# Patient Record
Sex: Female | Born: 1945 | Race: White | Hispanic: No
Health system: Midwestern US, Community
[De-identification: ages and names within clinical notes are randomized; demographics above are authoritative.]

## PROBLEM LIST (undated history)

## (undated) DIAGNOSIS — E785 Hyperlipidemia, unspecified: Secondary | ICD-10-CM

## (undated) DIAGNOSIS — H269 Unspecified cataract: Secondary | ICD-10-CM

## (undated) DIAGNOSIS — R32 Unspecified urinary incontinence: Secondary | ICD-10-CM

## (undated) DIAGNOSIS — I10 Essential (primary) hypertension: Secondary | ICD-10-CM

## (undated) DIAGNOSIS — H35039 Hypertensive retinopathy, unspecified eye: Secondary | ICD-10-CM

## (undated) DIAGNOSIS — F32A Depression, unspecified: Secondary | ICD-10-CM

## (undated) DIAGNOSIS — M75102 Unspecified rotator cuff tear or rupture of left shoulder, not specified as traumatic: Secondary | ICD-10-CM

## (undated) DIAGNOSIS — F419 Anxiety disorder, unspecified: Secondary | ICD-10-CM

## (undated) HISTORY — PX: FACIAL COSMETIC SURGERY: SHX629

## (undated) HISTORY — DX: Unspecified urinary incontinence: R32

## (undated) HISTORY — DX: Hypertensive retinopathy, unspecified eye: H35.039

## (undated) HISTORY — DX: Unspecified cataract: H26.9

## (undated) HISTORY — DX: Hyperlipidemia, unspecified: E78.5

## (undated) HISTORY — PX: OTHER SURGICAL HISTORY: SHX169

---
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
## 1998-09-27 ENCOUNTER — Emergency Department (HOSPITAL_COMMUNITY): Admission: EM | Admit: 1998-09-27 | Discharge: 1998-09-27 | Payer: Self-pay | Admitting: Emergency Medicine

## 1998-09-27 ENCOUNTER — Encounter: Payer: Self-pay | Admitting: Emergency Medicine

## 1999-04-20 ENCOUNTER — Encounter (INDEPENDENT_AMBULATORY_CARE_PROVIDER_SITE_OTHER): Payer: Self-pay | Admitting: Specialist

## 1999-04-20 ENCOUNTER — Other Ambulatory Visit: Admission: RE | Admit: 1999-04-20 | Discharge: 1999-04-20 | Payer: Self-pay | Admitting: *Deleted

## 1999-05-13 ENCOUNTER — Ambulatory Visit (HOSPITAL_BASED_OUTPATIENT_CLINIC_OR_DEPARTMENT_OTHER): Admission: RE | Admit: 1999-05-13 | Discharge: 1999-05-13 | Payer: Self-pay | Admitting: Plastic Surgery

## 1999-09-24 ENCOUNTER — Encounter: Admission: RE | Admit: 1999-09-24 | Discharge: 1999-09-24 | Payer: Self-pay | Admitting: *Deleted

## 1999-10-28 ENCOUNTER — Ambulatory Visit (HOSPITAL_COMMUNITY): Admission: RE | Admit: 1999-10-28 | Discharge: 1999-10-28 | Payer: Self-pay | Admitting: Gastroenterology

## 2000-04-20 ENCOUNTER — Other Ambulatory Visit: Admission: RE | Admit: 2000-04-20 | Discharge: 2000-04-20 | Payer: Self-pay | Admitting: *Deleted

## 2001-04-28 ENCOUNTER — Other Ambulatory Visit: Admission: RE | Admit: 2001-04-28 | Discharge: 2001-04-28 | Payer: Self-pay | Admitting: *Deleted

## 2002-07-31 ENCOUNTER — Other Ambulatory Visit: Admission: RE | Admit: 2002-07-31 | Discharge: 2002-07-31 | Payer: Self-pay

## 2006-04-26 ENCOUNTER — Other Ambulatory Visit: Admission: RE | Admit: 2006-04-26 | Discharge: 2006-04-26 | Payer: Self-pay | Admitting: Obstetrics and Gynecology

## 2006-06-29 ENCOUNTER — Ambulatory Visit: Payer: Self-pay | Admitting: Family Medicine

## 2006-09-27 ENCOUNTER — Ambulatory Visit: Payer: Self-pay | Admitting: Family Medicine

## 2007-01-11 ENCOUNTER — Ambulatory Visit: Payer: Self-pay | Admitting: Family Medicine

## 2007-01-16 ENCOUNTER — Encounter: Admission: RE | Admit: 2007-01-16 | Discharge: 2007-01-16 | Payer: Self-pay | Admitting: Family Medicine

## 2007-01-17 ENCOUNTER — Ambulatory Visit: Payer: Self-pay | Admitting: Family Medicine

## 2007-03-02 ENCOUNTER — Ambulatory Visit: Payer: Self-pay | Admitting: Family Medicine

## 2007-04-19 ENCOUNTER — Encounter: Admission: RE | Admit: 2007-04-19 | Discharge: 2007-04-19 | Payer: Self-pay | Admitting: Family Medicine

## 2007-06-05 ENCOUNTER — Ambulatory Visit: Payer: Self-pay | Admitting: Family Medicine

## 2007-06-27 ENCOUNTER — Ambulatory Visit: Payer: Self-pay | Admitting: Family Medicine

## 2007-09-27 ENCOUNTER — Other Ambulatory Visit: Admission: RE | Admit: 2007-09-27 | Discharge: 2007-09-27 | Payer: Self-pay | Admitting: Obstetrics and Gynecology

## 2007-10-16 ENCOUNTER — Ambulatory Visit: Payer: Self-pay | Admitting: Family Medicine

## 2007-10-18 ENCOUNTER — Encounter: Admission: RE | Admit: 2007-10-18 | Discharge: 2007-10-18 | Payer: Self-pay | Admitting: Family Medicine

## 2007-10-19 ENCOUNTER — Encounter: Admission: RE | Admit: 2007-10-19 | Discharge: 2007-10-19 | Payer: Self-pay | Admitting: Family Medicine

## 2008-10-30 ENCOUNTER — Other Ambulatory Visit: Admission: RE | Admit: 2008-10-30 | Discharge: 2008-10-30 | Payer: Self-pay | Admitting: Obstetrics and Gynecology

## 2008-11-12 ENCOUNTER — Ambulatory Visit: Payer: Self-pay | Admitting: Family Medicine

## 2008-12-10 ENCOUNTER — Ambulatory Visit: Payer: Self-pay | Admitting: Sports Medicine

## 2008-12-10 DIAGNOSIS — M629 Disorder of muscle, unspecified: Secondary | ICD-10-CM | POA: Insufficient documentation

## 2008-12-10 DIAGNOSIS — M25559 Pain in unspecified hip: Secondary | ICD-10-CM | POA: Insufficient documentation

## 2008-12-10 DIAGNOSIS — M2420 Disorder of ligament, unspecified site: Secondary | ICD-10-CM | POA: Insufficient documentation

## 2009-10-10 ENCOUNTER — Ambulatory Visit: Payer: Self-pay | Admitting: Family Medicine

## 2010-11-13 NOTE — Op Note (Signed)
[facility]  Patient:    Emily Reid, Emily Reid                        MRN: [id number] Proc. Date: 10/28/99 Attending:  Griffith Citron, M.D. CC:         Warrick Parisian, M.D.             Sung Amabile Roslyn Smiling, M.D.                           Operative Report  PROCEDURE:  Colonoscopy.  INDICATION:  A 65 year old white female undergoing initial colorectal neoplasia  surveillance, prompted by recent change in bowel habits by becoming more constipated rather abruptly.  No change in stool caliber.  Denies hematochezia. No family history of colorectal neoplasia.  DESCRIPTION OF PROCEDURE:  After reviewing the nature of the procedure with the  patient, including the potential risks and complications; and after discussing alternative methods of diagnosis and treatment, an informed consent was signed.   The patient was premedicated, receiving IV sedation totalling Versed 6 mg, fentanyl 75 mcg administered in divided doses prior to, and during the course of the procedure.  Using an Olympus pediatric PCF-140L video colonoscope, the rectum was intubated  after normal digital examination.  The scope was inserted and advanced around the entire length of the colon to the cecum -- identified by the appendiceal orifice and ileocecal valve.  Preparation was excellent to the right colon, where it was good.  The scope was slowly withdrawn with careful inspection of the entire colon in a retrograde manner.  No abnormality was noted.  Specifically, there was no evidence of colorectal neoplasia, mucosal inflammation, vascular lesion, or diverticular change.  Retroflex view in the rectal vault was normal.  The vault was decompressed.  The scope was withdrawn.  The patient tolerated the procedure without difficulty, being maintained on dAdo scope monitor and low-flow oxygen throughout.  TIME:  Two.  TECHNICAL:  One.  PREPARATION:  Two.  TOTAL SCORE:   Five.  ASSESSMENT: 1. Normal colonoscopy. 2. Constipation -- functional.  RECOMMENDATION: 1. Miralax p.r.n. 2. High-fiber diet. 3. Colonoscopy in 10 years. 4. Annual Hemoccult -- Dr. Traci Sermon. DD:  10/28/99 TD:  10/30/99 Job: [id number]

## 2011-12-18 ENCOUNTER — Emergency Department (HOSPITAL_COMMUNITY)
Admission: EM | Admit: 2011-12-18 | Discharge: 2011-12-19 | Disposition: A | Discharge status: Hospice | Payer: Medicare Other | Attending: Emergency Medicine | Admitting: Emergency Medicine

## 2011-12-18 ENCOUNTER — Emergency Department (HOSPITAL_COMMUNITY): Payer: Medicare Other

## 2011-12-18 ENCOUNTER — Encounter (HOSPITAL_COMMUNITY): Payer: Self-pay | Admitting: Emergency Medicine

## 2011-12-18 DIAGNOSIS — Z79899 Other long term (current) drug therapy: Secondary | ICD-10-CM | POA: Insufficient documentation

## 2011-12-18 DIAGNOSIS — L03211 Cellulitis of face: Secondary | ICD-10-CM | POA: Insufficient documentation

## 2011-12-18 DIAGNOSIS — I10 Essential (primary) hypertension: Secondary | ICD-10-CM | POA: Insufficient documentation

## 2011-12-18 DIAGNOSIS — K122 Cellulitis and abscess of mouth: Secondary | ICD-10-CM

## 2011-12-18 DIAGNOSIS — L0201 Cutaneous abscess of face: Secondary | ICD-10-CM | POA: Insufficient documentation

## 2011-12-18 DIAGNOSIS — R220 Localized swelling, mass and lump, head: Secondary | ICD-10-CM | POA: Insufficient documentation

## 2011-12-18 HISTORY — DX: Essential (primary) hypertension: I10

## 2011-12-18 LAB — DIFFERENTIAL
Basophils Absolute: 0 10*3/uL (ref 0.0–0.1)
Basophils Relative: 0 % (ref 0–1)
Eosinophils Absolute: 0.1 10*3/uL (ref 0.0–0.7)
Neutro Abs: 8.3 10*3/uL — ABNORMAL HIGH (ref 1.7–7.7)
Neutrophils Relative %: 82 % — ABNORMAL HIGH (ref 43–77)

## 2011-12-18 LAB — CBC
MCH: 31.3 pg (ref 26.0–34.0)
MCHC: 34.2 g/dL (ref 30.0–36.0)
Platelets: 212 10*3/uL (ref 150–400)
RDW: 12.7 % (ref 11.5–15.5)

## 2011-12-18 LAB — POCT I-STAT, CHEM 8
Chloride: 95 mEq/L — ABNORMAL LOW (ref 96–112)
HCT: 37 % (ref 36.0–46.0)
Hemoglobin: 12.6 g/dL (ref 12.0–15.0)
Potassium: 3.7 mEq/L (ref 3.5–5.1)

## 2011-12-18 MED ORDER — SODIUM CHLORIDE 0.9 % IV SOLN
INTRAVENOUS | Status: DC
  Administered 2011-12-18: 19:00:00 via INTRAVENOUS

## 2011-12-18 MED ORDER — DEXTROSE 5 % IV SOLN
INTRAVENOUS | Status: AC
  Filled 2011-12-18: qty 50

## 2011-12-18 MED ORDER — ONDANSETRON HCL 4 MG/2ML IJ SOLN: 4.0000 mg | Freq: Once | INTRAMUSCULAR | Status: DC

## 2011-12-18 MED ORDER — MORPHINE SULFATE 4 MG/ML IJ SOLN
4.0000 mg | Freq: Once | INTRAMUSCULAR | Status: AC
  Administered 2011-12-18: 4 mg via INTRAVENOUS
  Filled 2011-12-18: qty 1

## 2011-12-18 MED ORDER — FENTANYL CITRATE 0.05 MG/ML IJ SOLN
50.0000 ug | Freq: Once | INTRAMUSCULAR | Status: AC
  Administered 2011-12-18: 50 ug via INTRAVENOUS
  Filled 2011-12-18: qty 2

## 2011-12-18 MED ORDER — CLINDAMYCIN PHOSPHATE 600 MG/50ML IV SOLN
600.0000 mg | Freq: Once | INTRAVENOUS | Status: AC
  Administered 2011-12-18: 600 mg via INTRAVENOUS

## 2011-12-18 MED ORDER — IOHEXOL 300 MG/ML  SOLN
75.0000 mL | Freq: Once | INTRAMUSCULAR | Status: AC | PRN
  Administered 2011-12-18: 75 mL via INTRAVENOUS

## 2011-12-18 MED ORDER — CLINDAMYCIN PHOSPHATE 600 MG/4ML IJ SOLN
INTRAMUSCULAR | Status: AC
  Filled 2011-12-18: qty 4

## 2011-12-18 MED ORDER — ONDANSETRON HCL 4 MG/2ML IJ SOLN
4.0000 mg | Freq: Once | INTRAMUSCULAR | Status: AC
  Administered 2011-12-18: 4 mg via INTRAVENOUS
  Filled 2011-12-18: qty 2

## 2011-12-18 NOTE — ED Notes (Signed)
Pt reports swelling to right jaw side. Pt has been taken Augmentin since Thursday. Pt seen by dentist and had wisdom tooth removed. Pt continued to have swelling and had CT done. Pt seen by endodontitis today and told to come to ED because swelling is not in her teeth. Pt reports difficulty to swallow but denies shortness of breath.

## 2011-12-18 NOTE — ED Provider Notes (Signed)
History     CSN: [id number]  Arrival date & time 12/18/11  1441   First MD Initiated Contact with Patient 12/18/11 1742      Chief Complaint  Patient presents with  . Facial Swelling    right side of face    (Consider location/radiation/quality/duration/timing/severity/associated sxs/prior treatment) HPI  Past Medical History  Diagnosis Date  . Hypertension     Past Surgical History  Procedure Date  . Facial cosmetic surgery     History reviewed. No pertinent family history.  History  Substance Use Topics  . Smoking status: Never Smoker   . Smokeless tobacco: Not on file  . Alcohol Use: 4.2 oz/week    7 Glasses of wine per week    OB History    Grav Para Term Preterm Abortions TAB SAB Ect Mult Living                  Review of Systems  Allergies  Review of patient's allergies indicates no known allergies.  Home Medications   Current Outpatient Rx  Name Route Sig Dispense Refill  . AMOXICILLIN-POT CLAVULANATE 875-125 MG PO TABS Oral Take 1 tablet by mouth 2 (two) times daily. Take for 10 days.  First dose 12/16/2011.    Marland Kitchen BUPROPION HCL ER (XL) 150 MG PO TB24 Oral Take 150 mg by mouth daily.    Marland Kitchen VITAMIN D PO Oral Take 1 capsule by mouth daily.    . IBUPROFEN 200 MG PO TABS Oral Take 200 mg by mouth every 6 (six) hours as needed. For pain.    Marland Kitchen LOSARTAN POTASSIUM PO Oral Take 1 tablet by mouth daily.    . ADULT MULTIVITAMIN W/MINERALS CH Oral Take 1 tablet by mouth daily.    Marland Kitchen FISH OIL PO Oral Take 1 capsule by mouth 5 (five) times daily.    . SERTRALINE HCL 50 MG PO TABS Oral Take 50 mg by mouth daily.    Marland Kitchen SIMVASTATIN 80 MG PO TABS Oral Take 80 mg by mouth at bedtime.    Marland Kitchen VITAMIN C 500 MG PO TABS Oral Take 500 mg by mouth daily.      BP 132/63  Pulse 74  Temp 98.2 F (36.8 C) (Oral)  Resp 16  SpO2 97%  Physical Exam  ED Course  Procedures (including critical care time)  Labs Reviewed  CBC - Abnormal; Notable for the following:    RBC 3.80  (*)     Hemoglobin 11.9 (*)     HCT 34.8 (*)     All other components within normal limits  DIFFERENTIAL - Abnormal; Notable for the following:    Neutrophils Relative 82 (*)     Neutro Abs 8.3 (*)     Lymphocytes Relative 7 (*)     All other components within normal limits  POCT I-STAT, CHEM 8 - Abnormal; Notable for the following:    Sodium 132 (*)     Chloride 95 (*)     Creatinine, Ser 1.20 (*)     Glucose, Bld 118 (*)     All other components within normal limits   Ct Soft Tissue Neck W Contrast  12/18/2011  *RADIOLOGY REPORT*  Clinical Data:   submandibular cellulitis.  Rule out abscess  CT NECK WITH CONTRAST  Technique:  Multidetector CT imaging of the neck was performed with intravenous contrast.  Contrast: 75mL OMNIPAQUE IOHEXOL 300 MG/ML  SOLN  Comparison: None.  Findings: Extensive swelling is present in the floor the  mouth on the right.  There is a serpiginous fluid collection in the floor of the mouth  compatible with an abscess.  This extends back to the submandibular gland.  There is a gas bubble within the fluid collection near the submandibular gland.  No submandibular ductal calculus is identified.  The right lower third molar is missing, and has been recently extracted.  The bone appears well marginated. The  abscess extends to the midline and displaces the midline to the left .  There is edema in the subcutaneous tissues and the chin compatible with cellulitis.  The left submandibular gland is normal.  The parotid gland is normal bilaterally.  Mild reactive adenopathy is present in the neck.  The larynx is normal.  Thyroid is normal.  Lung apices are clear.  Cervical spondylosis is present without acute abnormality in the spine.  IMPRESSION: Multi locular serpiginous abscess in the floor the mouth on the right, extending back into the right submandibular gland.  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  There is mild adenopathy.  I discussed the findings by  telephone with Dr. Fonnie Jarvis at the time of interpretation.  Original Report Authenticated By: Camelia Phenes, M.D.     1. Submandibular abscess    8:17 PM Patient seen and examined. Transfer to [facility] arranged by Dr. Fonnie Jarvis.   Patient seen and examined. Will give more pain medication.   Exam:  Gen NAD; Mouth + woody tongue, R mandibular firmness and tenderness consistent with abscess; Heart RRR, nml S1,S2, no m/r/g; Lungs CTAB; Abd soft, NT, no rebound or guarding; Ext 2+ pedal pulses bilaterally, no edema.   MDM  Transfer to [facility] for Ludwig's/submandibular abscess.        Renne Crigler, [place] 12/18/11 2019

## 2011-12-18 NOTE — ED Notes (Signed)
Pt husband at desk stating that patient is experiencing increased trouble swallowing over the last 30 min, pt sitting in chair, skin warm and dry, no distress noted, pt next to acute bed.

## 2011-12-18 NOTE — ED Provider Notes (Cosign Needed)
History     CSN: [id number]  Arrival date & time 12/18/11  1441   First MD Initiated Contact with Patient 12/18/11 1742      Chief Complaint  Patient presents with  . Facial Swelling    right side of face    (Consider location/radiation/quality/duration/timing/severity/associated sxs/prior treatment) HPI This 66 year old female lives in [place] and is here visiting, she lived here for about 30 years and moved away 2 years ago, she has now about 5 days of the submandibular pain redness swelling cellulitis getting worse despite 3 days of Augmentin, she was seen in [facility] and started on Augmentin 2 days ago, she had CT scan which was indeterminate, she had no improvement by seeing a dentist who extracted tooth #32, she then followed up with an endodontist today while visiting here in [place] who recommended she come to the hospital since her infection is getting worse and she might need an oral surgeon because he did not feel her infection was from her teeth, she has no dental pain fever headache or neck swelling, she is no stridor or drooling, her speech is normal, she has no cough chest pain shortness breath or abdominal pain but she does have some nausea. She's had no vomiting. Her pain is moderately severe well localized without radiation or other associated symptoms. Past Medical History  Diagnosis Date  . Hypertension   lipids; depression  Past Surgical History  Procedure Date  . Facial cosmetic surgery     History reviewed. No pertinent family history.  History  Substance Use Topics  . Smoking status: Never Smoker   . Smokeless tobacco: Not on file  . Alcohol Use: 4.2 oz/week    7 Glasses of wine per week    OB History    Grav Para Term Preterm Abortions TAB SAB Ect Mult Living                  Review of Systems  Constitutional: Negative for fever.       10 Systems reviewed and are negative for acute change except as noted in  the HPI.  HENT: Negative for congestion.   Eyes: Negative for discharge and redness.  Respiratory: Negative for cough and shortness of breath.   Cardiovascular: Negative for chest pain.  Gastrointestinal: Negative for vomiting and abdominal pain.  Musculoskeletal: Negative for back pain.  Skin: Negative for rash.  Neurological: Negative for syncope, numbness and headaches.  Psychiatric/Behavioral:       No behavior change.    Allergies  Review of patient's allergies indicates no known allergies.  Home Medications   Current Outpatient Rx  Name Route Sig Dispense Refill  . AMOXICILLIN-POT CLAVULANATE 875-125 MG PO TABS Oral Take 1 tablet by mouth 2 (two) times daily. Take for 10 days.  First dose 12/16/2011.    Marland Kitchen BUPROPION HCL ER (XL) 150 MG PO TB24 Oral Take 150 mg by mouth daily.    Marland Kitchen VITAMIN D PO Oral Take 1 capsule by mouth daily.    . IBUPROFEN 200 MG PO TABS Oral Take 200 mg by mouth every 6 (six) hours as needed. For pain.    Marland Kitchen LOSARTAN POTASSIUM PO Oral Take 1 tablet by mouth daily.    . ADULT MULTIVITAMIN W/MINERALS CH Oral Take 1 tablet by mouth daily.    Marland Kitchen FISH OIL PO Oral Take 1 capsule by mouth 5 (five) times daily.    . SERTRALINE HCL 50 MG PO TABS Oral Take  50 mg by mouth daily.    Marland Kitchen SIMVASTATIN 80 MG PO TABS Oral Take 80 mg by mouth at bedtime.    Marland Kitchen VITAMIN C 500 MG PO TABS Oral Take 500 mg by mouth daily.      BP 137/76  Pulse 83  Temp 99 F (37.2 C) (Oral)  Resp 16  SpO2 96%  Physical Exam  Nursing note and vitals reviewed. Constitutional:       Awake, alert, nontoxic appearance.  HENT:  Head: Atraumatic.  Mouth/Throat: Oropharynx is clear and moist. No oropharyngeal exudate.       Dentition is nontender, submandibular area shows diffuse erythema edema induration tenderness cellulitis without fluctuance with most of the cellulitis on the right side extending down her neck over the rest of her neck is nontender without lymphadenopathy or airway compromise    Eyes: Right eye exhibits no discharge. Left eye exhibits no discharge.  Neck: Neck supple. No JVD present. No tracheal deviation present.  Cardiovascular: Normal rate and regular rhythm.   No murmur heard. Pulmonary/Chest: Effort normal and breath sounds normal. No stridor. No respiratory distress. She has no wheezes. She has no rales. She exhibits no tenderness.  Abdominal: Soft. There is no tenderness. There is no rebound.  Musculoskeletal: She exhibits no tenderness.       Baseline ROM, no obvious new focal weakness.  Lymphadenopathy:    She has no cervical adenopathy.  Neurological:       Mental status and motor strength appears baseline for patient and situation.  Skin: No rash noted.  Psychiatric: She has a normal mood and affect.    ED Course  Procedures (including critical care time) No airway compromise in ED, no oral surgery coverage available locally, patient accepted for transfer to [facility] by Dr. Lucretia Roers.2005 Labs Reviewed  CBC - Abnormal; Notable for the following:    RBC 3.80 (*)     Hemoglobin 11.9 (*)     HCT 34.8 (*)     All other components within normal limits  DIFFERENTIAL - Abnormal; Notable for the following:    Neutrophils Relative 82 (*)     Neutro Abs 8.3 (*)     Lymphocytes Relative 7 (*)     All other components within normal limits  POCT I-STAT, CHEM 8 - Abnormal; Notable for the following:    Sodium 132 (*)     Chloride 95 (*)     Creatinine, Ser 1.20 (*)     Glucose, Bld 118 (*)     All other components within normal limits  LAB REPORT - SCANNED   No results found.   1. Submandibular abscess       MDM  Pt stable in ED with no significant deterioration in condition.Patient / Family / Caregiver informed of clinical course, understand medical decision-making process, and agree with plan.The patient appears reasonably stabilized for transfer considering the current resources, flow, and capabilities available in the ED at this time, and I  doubt any other [facility] requiring further screening and/or treatment in the ED prior to transfer.  Medical screening examination/treatment/procedure(s) were conducted as a shared visit with non-physician practitioner(s) and myself.  I personally evaluated the patient during the encounter        Hurman Horn, MD 12/23/11 [phone]

## 2011-12-19 NOTE — ED Provider Notes (Signed)
Medical screening examination/treatment/procedure(s) were conducted as a shared visit with non-physician practitioner(s) and myself.  I personally evaluated the patient during the encounter  Hurman Horn, MD 12/19/11 1249

## 2016-11-10 LAB — HM COLONOSCOPY

## 2016-11-17 LAB — HM COLONOSCOPY

## 2018-08-18 LAB — CBC WITH AUTO DIFFERENTIAL
Absolute Baso #: 0 10*3/uL (ref 0.0–0.2)
Absolute Eos #: 0.2 10*3/uL (ref 0.0–0.5)
Absolute Lymph #: 1.3 10*3/uL (ref 1.0–3.2)
Absolute Mono #: 0.4 10*3/uL (ref 0.3–1.0)
Basophils %: 0.8 % (ref 0.0–2.0)
Eosinophils %: 4.4 % (ref 0.0–7.0)
Hematocrit: 38.9 % (ref 38.0–47.0)
Hemoglobin: 12.7 g/dL (ref 11.5–15.7)
Immature Grans (Abs): 0 10*3/uL
Immature Granulocytes: 0.3 %
Lymphocytes: 32.6 % (ref 15.0–45.0)
MCH: 30.7 pg (ref 27.0–34.5)
MCHC: 32.6 g/dL (ref 32.0–36.0)
MCV: 94 fL (ref 81.0–99.0)
MPV: 10.4 fL (ref 7.2–13.2)
Monocytes: 10.9 % (ref 4.0–12.0)
NRBC Absolute: 0 10*3/uL
NRBC Automated: 0 %
Neutrophils %: 51 % (ref 42.0–74.0)
Neutrophils Absolute: 2 10*3/uL (ref 1.6–7.3)
Platelets: 309 10*3/uL (ref 140–440)
RBC: 4.14 x10e6/mcL (ref 3.60–5.20)
RDW: 12.3 % (ref 11.0–16.0)
WBC: 3.8 10*3/uL (ref 3.8–10.6)

## 2018-08-18 LAB — LIPID PANEL
Chol/HDL Ratio: 1.9 (ref 0.0–4.4)
Cholesterol: 217 mg/dL — ABNORMAL HIGH (ref 100–200)
HDL: 116 mg/dL — ABNORMAL HIGH (ref 65–96)
LDL Cholesterol: 91 mg/dL (ref 0.0–100.0)
LDL/HDL Ratio: 0.8
Triglycerides: 48 mg/dL (ref 0–149)
VLDL: 9.6 mg/dL (ref 5.0–40.0)

## 2018-08-18 LAB — COMPREHENSIVE METABOLIC PANEL
ALT: 15 U/L (ref 0–33)
AST: 24 U/L (ref 0–32)
Albumin/Globulin Ratio: 2.5 mmol/L — ABNORMAL HIGH (ref 1.00–2.00)
Albumin: 4.9 g/dL (ref 3.5–5.2)
Alk Phosphatase: 41 U/L (ref 35–117)
Anion Gap: 11 mmol/L (ref 2–17)
BUN: 18 mg/dL (ref 8–23)
CO2: 26 mmol/L (ref 22–29)
Calcium: 9.3 mg/dL (ref 8.8–10.2)
Chloride: 101 mmol/L (ref 98–107)
Creatinine: 0.9 mg/dL (ref 0.5–0.9)
GFR African American: 74 mL/min/{1.73_m2} — ABNORMAL LOW (ref >=90)
GFR Non-African American: 64 mL/min/{1.73_m2} — ABNORMAL LOW (ref >=90)
Globulin: 2 g/dL (ref 1.9–4.4)
Glucose: 94 mg/dL (ref 70–99)
OSMOLALITY CALCULATED: 277 mOsm/kg (ref 270–287)
Potassium: 4.5 mmol/L (ref 3.5–5.3)
Sodium: 138 mmol/L (ref 135–145)
Total Bilirubin: 0.3 mg/dL (ref 0.00–1.20)
Total Protein: 6.9 g/dL (ref 6.4–8.3)

## 2018-08-18 LAB — URINALYSIS WITH MICROSCOPIC
Amorphous, UA: NONE SEEN /HPF
Bilirubin Urine: NEGATIVE
Blood, Urine: NEGATIVE
Glucose, UA: NEGATIVE mg/dL
Ketones, Urine: NEGATIVE mg/dL
Leukocyte Esterase, Urine: NEGATIVE
Nitrite, Urine: NEGATIVE
Protein, UA: NEGATIVE
Specific Gravity, UA: 1.02 (ref 1.003–1.035)
Urobilinogen, Urine: 0.2 EU/dL
WBC, UA: NONE SEEN /HPF (ref 0–2)
pH, UA: 6 (ref 4.5–8.0)

## 2019-08-17 LAB — CBC WITH AUTO DIFFERENTIAL
Absolute Baso #: 0 10*3/uL (ref 0.0–0.2)
Absolute Eos #: 0.2 10*3/uL (ref 0.0–0.5)
Absolute Lymph #: 1.1 10*3/uL (ref 1.0–3.2)
Absolute Mono #: 0.4 10*3/uL (ref 0.3–1.0)
Basophils %: 0.5 % (ref 0.0–2.0)
Eosinophils %: 4.4 % (ref 0.0–7.0)
Hematocrit: 40.4 % (ref 34.0–47.0)
Hemoglobin: 13.4 g/dL (ref 11.5–15.7)
Immature Grans (Abs): 0.01 10*3/uL (ref 0.00–0.06)
Immature Granulocytes: 0.2 % (ref 0.1–0.6)
Lymphocytes: 25.5 % (ref 15.0–45.0)
MCH: 30.4 pg (ref 27.0–34.5)
MCHC: 33.2 g/dL (ref 32.0–36.0)
MCV: 91.6 fL (ref 81.0–99.0)
MPV: 10.2 fL (ref 7.2–13.2)
Monocytes: 8.4 % (ref 4.0–12.0)
NRBC Absolute: 0 10*3/uL (ref 0.000–0.012)
NRBC Automated: 0 % (ref 0.0–0.2)
Neutrophils %: 61 % (ref 42.0–74.0)
Neutrophils Absolute: 2.6 10*3/uL (ref 1.6–7.3)
Platelets: 318 10*3/uL (ref 140–440)
RBC: 4.41 x10e6/mcL (ref 3.60–5.20)
RDW: 12.4 % (ref 11.0–16.0)
WBC: 4.3 10*3/uL (ref 3.8–10.6)

## 2019-08-18 LAB — COMPREHENSIVE METABOLIC PANEL
ALT: 20 U/L (ref 0–33)
AST: 25 U/L (ref 0–32)
Albumin/Globulin Ratio: 2.2 mmol/L — ABNORMAL HIGH (ref 1.00–2.00)
Albumin: 4.8 g/dL (ref 3.5–5.2)
Alk Phosphatase: 63 U/L (ref 35–117)
Anion Gap: 6 mmol/L (ref 2–17)
BUN: 21 mg/dL (ref 8–23)
CO2: 29 mmol/L (ref 22–29)
Calcium: 10 mg/dL (ref 8.8–10.2)
Chloride: 101 mmol/L (ref 98–107)
Creatinine: 0.9 mg/dL (ref 0.5–1.0)
GFR African American: 74 mL/min/{1.73_m2} — ABNORMAL LOW (ref >=90)
GFR Non-African American: 63 mL/min/{1.73_m2} — ABNORMAL LOW (ref >=90)
Globulin: 2 g/dL (ref 1.9–4.4)
Glucose: 97 mg/dL (ref 70–99)
OSMOLALITY CALCULATED: 275 mOsm/kg (ref 270–287)
Potassium: 4.8 mmol/L (ref 3.5–5.3)
Sodium: 136 mmol/L (ref 135–145)
Total Bilirubin: 0.3 mg/dL (ref 0.00–1.20)
Total Protein: 7 g/dL (ref 6.4–8.3)

## 2019-08-18 LAB — URINALYSIS W/ RFLX MICROSCOPIC
Bilirubin Urine: NEGATIVE
Blood, Urine: NEGATIVE
Glucose, UA: NEGATIVE mg/dL
Ketones, Urine: NEGATIVE mg/dL
Leukocyte Esterase, Urine: NEGATIVE
Nitrite, Urine: NEGATIVE
Protein, UA: NEGATIVE
Specific Gravity, UA: 1.015 (ref 1.003–1.035)
Urobilinogen, Urine: 0.2 EU/dL
pH, UA: 6 (ref 4.5–8.0)

## 2019-08-18 LAB — LIPID PANEL
Chol/HDL Ratio: 1.9 (ref 0.0–4.4)
Cholesterol: 221 mg/dL — ABNORMAL HIGH (ref 100–200)
HDL: 115 mg/dL — ABNORMAL HIGH (ref 65–96)
LDL Cholesterol: 95 mg/dL (ref 0.0–100.0)
LDL/HDL Ratio: 0.8
Triglycerides: 56 mg/dL (ref 0–149)
VLDL: 11.2 mg/dL (ref 5.0–40.0)

## 2020-09-23 NOTE — Progress Notes (Signed)
[facility] - Clinic Note  09/24/2020     CHIEF COMPLAINT Patient presents for Retina Evaluation   HISTORY OF PRESENT ILLNESS: Emily Reid is a 75 y.o. female who presents to the clinic today for:   HPI    Retina Evaluation    In both eyes.  I, the attending physician,  performed the HPI with the patient and updated documentation appropriately.          Comments    Patient here for Retina Evaluation. Referred by Dr Harless Litten.  Patient states vision doing pretty good. No eye pain. Was doing avastin injection OD. Was doing every 8 - 10 weeks.        Last edited by Rennis Chris, MD on 09/24/2020 11:22 PM. (History)    pt has hx of BRVO with Dr. Harless Litten in [place], [place], she has been receiving Lucentis q7-8w (last injection 02.04.22), pts VA was 20/20 at her last visit  Referring physician: Lois Huxley MD [address] [place], [place] [address]  HISTORICAL INFORMATION:   Selected notes from the MEDICAL RECORD NUMBER Referred by Dr. Harless Litten for continuation of eye injections OD for BRVO with edema LEE: 08/05/2020 Ocular Hx- BRVO, CME, iritis OD PMH- HTN    CURRENT MEDICATIONS: No current outpatient medications on file. (Ophthalmic Drugs)   No current facility-administered medications for this visit. (Ophthalmic Drugs)   Current Outpatient Medications (Other)  Medication Sig  . amoxicillin-clavulanate (AUGMENTIN) 875-125 MG per tablet Take 1 tablet by mouth 2 (two) times daily. Take for 10 days.  First dose 12/16/2011.  Marland Kitchen buPROPion (WELLBUTRIN XL) 150 MG 24 hr tablet Take 150 mg by mouth daily.  . Cholecalciferol (VITAMIN D PO) Take 1 capsule by mouth daily.  Marland Kitchen ibuprofen (ADVIL,MOTRIN) 200 MG tablet Take 200 mg by mouth every 6 (six) hours as needed. For pain.  Marland Kitchen LOSARTAN POTASSIUM PO Take 1 tablet by mouth daily.  . Multiple Vitamin (MULTIVITAMIN WITH MINERALS) TABS Take 1 tablet by mouth daily.  . Omega-3 Fatty Acids (FISH OIL PO) Take 1  capsule by mouth 5 (five) times daily.  . sertraline (ZOLOFT) 50 MG tablet Take 50 mg by mouth daily.  . simvastatin (ZOCOR) 80 MG tablet Take 80 mg by mouth at bedtime.  . vitamin C (ASCORBIC ACID) 500 MG tablet Take 500 mg by mouth daily.   No current facility-administered medications for this visit. (Other)      REVIEW OF SYSTEMS: ROS    Positive for: Eyes   Negative for: Constitutional, Gastrointestinal, Neurological, Skin, Genitourinary, Musculoskeletal, HENT, Endocrine, Cardiovascular, Respiratory, Psychiatric, Allergic/Imm, Heme/Lymph   Last edited by Corrinne Eagle on 09/24/2020  4:02 PM. (History)       ALLERGIES No Known Allergies  PAST MEDICAL HISTORY Past Medical History:  Diagnosis Date  . Hypertension    Past Surgical History:  Procedure Laterality Date  . FACIAL COSMETIC SURGERY      FAMILY HISTORY History reviewed. No pertinent family history.  SOCIAL HISTORY Social History   Tobacco Use  . Smoking status: Never Smoker  Substance Use Topics  . Alcohol use: Yes    Alcohol/week: 7.0 standard drinks    Types: 7 Glasses of wine per week  . Drug use: No         OPHTHALMIC EXAM:  Base Eye Exam    Visual Acuity (Snellen - Linear)      Right Left   Dist cc 20/20 20/25 +1   Dist  ph cc  20/20   Correction: Glasses       Tonometry (Tonopen, 2:17 PM)      Right Left   Pressure 18 17       Pupils      Dark Light Shape React APD   Right 3 2 Round Brisk None   Left 3 2 Round Brisk None       Visual Fields (Counting fingers)      Left Right    Full Full       Extraocular Movement      Right Left    Full, Ortho Full, Ortho       Neuro/Psych    Oriented x3: Yes   Mood/Affect: Normal       Dilation    Both eyes: 1.0% Mydriacyl, 2.5% Phenylephrine @ 2:17 PM        Slit Lamp and Fundus Exam    Slit Lamp Exam      Right Left   Lids/Lashes Dermatochalasis - upper lid Dermatochalasis - upper lid   Conjunctiva/Sclera White and  quiet White and quiet   Cornea 2+ Punctate epithelial erosions, early band K nasal and temporal 2+ Punctate epithelial erosions, early band K nasal and temporal   Anterior Chamber Deep and quiet Deep and quiet   Iris Round and dilated Round and dilated   Lens 2+ Nuclear sclerosis, 1-2+ Cortical cataract, focal Posterior subcapsular cataract at 0700 2+ Nuclear sclerosis, 2+ Cortical cataract, focal Posterior subcapsular cataract at 0700   Vitreous Vitreous syneresis, silicone oil micro bubbles Vitreous syneresis, silicone oil micro bubbles, Posterior vitreous detachment       Fundus Exam      Right Left   Disc Pink and Sharp, mild PPA Pink and Sharp   C/D Ratio 0.3 0.2   Macula Flat, Blunted foveal reflex, Drusen, RPE mottling, No heme or edema Flat, Blunted foveal reflex, Drusen, RPE mottling, No heme or edema   Vessels attenuated, mild tortuousity attenuated, mild tortuousity   Periphery Attached, no heme    Attached, mild mid zonal drusen, no heme           Refraction    Wearing Rx      Sphere Cylinder Axis Add   Right -0.50 +0.25 031 +2.50   Left +1.00 Sphere  +2.50       Manifest Refraction (Auto)      Sphere Cylinder Axis Dist VA   Right +0.00 +0.75 023 20/25   Left +1.25 +0.75 017 20/20          IMAGING AND PROCEDURES  Imaging and Procedures for 09/24/2020  OCT, Retina - OU - Both Eyes       Right Eye Quality was good. Central Foveal Thickness: 282. Progression has no prior data. Findings include normal foveal contour, no IRF, no SRF (Hx of BRVO, no CME today, Irregular lamination inferior macula).   Left Eye Quality was good. Central Foveal Thickness: 296. Progression has no prior data. Findings include normal foveal contour, no IRF, no SRF, retinal drusen .   Notes *Images captured and stored on drive  Diagnosis / Impression:  OD: Hx of BRVO, no CME today, Irregular lamination inferior macula OS: NFP, no IRF/SRF, +drusen  Clinical management:  See  below  Abbreviations: NFP - Normal foveal profile. CME - cystoid macular edema. PED - pigment epithelial detachment. IRF - intraretinal fluid. SRF - subretinal fluid. EZ - ellipsoid zone. ERM - epiretinal membrane. ORA - outer retinal atrophy. ORT -  outer retinal tubulation. SRHM - subretinal hyper-reflective material. IRHM - intraretinal hyper-reflective material        Intravitreal Injection, Pharmacologic Agent - OD - Right Eye       Time Out 09/24/2020. 3:29 PM. Confirmed correct patient, procedure, site, and patient consented.   Anesthesia Topical anesthesia was used. Anesthetic medications included Lidocaine 2%, Proparacaine 0.5%.   Procedure A (32g) needle was used.   Injection:  1.25 mg Bevacizumab (AVASTIN) 1.25mg /0.32mL SOLN   NDC: 32440-102-72, Lot: [id number], Expiration date: 11/04/2020   Route: Intravitreal, Site: Right Eye, Waste: 0.05 mL  Post-op Post injection exam found visual acuity of at least counting fingers. The patient tolerated the procedure well. There were no complications. The patient received written and verbal post procedure care education.                 ASSESSMENT/PLAN:    ICD-10-CM   1. Branch retinal vein occlusion of right eye with macular edema  H34.8310 Intravitreal Injection, Pharmacologic Agent - OD - Right Eye    Bevacizumab (AVASTIN) SOLN 1.25 mg  2. Retinal edema  H35.81 OCT, Retina - OU - Both Eyes  3. Essential hypertension  I10   4. Hypertensive retinopathy of both eyes  H35.033   5. Combined forms of age-related cataract of both eyes  H25.813     1,2. BRVO w/ CME OD  - hx of injections with Dr. Harless Litten in [place], [place] -- pt was receiving Lucentis q7-8 wks; pt reports history Avastin, but was switched to Lucentis at some point - The natural history of retinal vein occlusion and macular edema and treatment options including observation, laser photocoagulation, and intravitreal antiVEGF injection with Avastin and Lucentis and  Eylea and intravitreal injection of steroids with triamcinolone and Ozurdex and the complications of these procedures including loss of vision, infection, cataract, glaucoma, and retinal detachment were discussed with patient. - Specifically discussed findings from recent studies regarding patient stabilization with anti-VEGF agents and increased potential for visual improvements.  Also discussed need for frequent follow up and potentially multiple injections given the chronic nature of the disease process - last injection IVL OD was 02.04.22 (7.5 wks) - BCVA OD is 20/20 - OCT shows no CME today, Irregular lamination inferior macula - recommend IVA OD #1 today, 03.30.22 -- maintenance therapy - RBA of procedure discussed, questions answered - informed consent obtained and signed - see procedure note - F/U 7 weeks -- DFE/OCT/possible injection  3,4. Hypertensive retinopathy OU - discussed importance of tight BP control - monitor  5. Mixed Cataract OU - The symptoms of cataract, surgical options, and treatments and risks were discussed with patient. - discussed diagnosis and progression - not yet visually significant - monitor for now  Ophthalmic Meds Ordered this visit:  Meds ordered this encounter  Medications  . Bevacizumab (AVASTIN) SOLN 1.25 mg      Return in about 7 weeks (around 11/12/2020) for f/u BRVO OD, DFE, OCT.  There are no Patient Instructions on file for this visit.   Explained the diagnoses, plan, and follow up with the patient and they expressed understanding.  Patient expressed understanding of the importance of proper follow up care.   This document serves as a record of services personally performed by Karie Chimera, MD, PhD. It was created on their behalf by Annalee Genta, COMT. The creation of this record is the provider's dictation and/or activities during the visit.  Electronically signed by: Annalee Genta, COMT 09/24/20 11:30 PM  This document serves as  a record of services personally performed by Karie Chimera, MD, PhD. It was created on their behalf by Glee Arvin. Manson Passey, OA an ophthalmic technician. The creation of this record is the provider's dictation and/or activities during the visit.    Electronically signed by: Glee Arvin. Manson Passey, [place] 03.30.2022 11:30 PM   Karie Chimera, M.D., Ph.D. Diseases & Surgery of the Retina and Vitreous [facility]  I have reviewed the above documentation for accuracy and completeness, and I agree with the above. Karie Chimera, M.D., Ph.D. 09/24/20 11:30 PM  Abbreviations: M myopia (nearsighted); A astigmatism; H hyperopia (farsighted); P presbyopia; Mrx spectacle prescription;  CTL contact lenses; OD right eye; OS left eye; OU both eyes  XT exotropia; ET esotropia; PEK punctate epithelial keratitis; PEE punctate epithelial erosions; DES dry eye syndrome; MGD meibomian gland dysfunction; ATs artificial tears; PFAT's preservative free artificial tears; NSC nuclear sclerotic cataract; PSC posterior subcapsular cataract; ERM epi-retinal membrane; PVD posterior vitreous detachment; RD retinal detachment; DM diabetes mellitus; DR diabetic retinopathy; NPDR non-proliferative diabetic retinopathy; PDR proliferative diabetic retinopathy; CSME clinically significant macular edema; DME diabetic macular edema; dbh dot blot hemorrhages; CWS cotton wool spot; POAG primary open angle glaucoma; C/D cup-to-disc ratio; HVF humphrey visual field; GVF goldmann visual field; OCT optical coherence tomography; IOP intraocular pressure; BRVO Branch retinal vein occlusion; CRVO central retinal vein occlusion; CRAO central retinal artery occlusion; BRAO branch retinal artery occlusion; RT retinal tear; SB scleral buckle; PPV pars plana vitrectomy; VH Vitreous hemorrhage; PRP panretinal laser photocoagulation; IVK intravitreal kenalog; VMT vitreomacular traction; MH Macular hole;  NVD neovascularization of the disc; NVE  neovascularization elsewhere; AREDS age related eye disease study; ARMD age related macular degeneration; POAG primary open angle glaucoma; EBMD epithelial/anterior basement membrane dystrophy; ACIOL anterior chamber intraocular lens; IOL intraocular lens; PCIOL posterior chamber intraocular lens; Phaco/IOL phacoemulsification with intraocular lens placement; PRK photorefractive keratectomy; LASIK laser assisted in situ keratomileusis; HTN hypertension; DM diabetes mellitus; COPD chronic obstructive pulmonary disease

## 2020-09-24 ENCOUNTER — Other Ambulatory Visit: Payer: Self-pay

## 2020-09-24 ENCOUNTER — Ambulatory Visit (INDEPENDENT_AMBULATORY_CARE_PROVIDER_SITE_OTHER): Payer: Medicare PPO | Admitting: Ophthalmology

## 2020-09-24 ENCOUNTER — Encounter (INDEPENDENT_AMBULATORY_CARE_PROVIDER_SITE_OTHER): Payer: Self-pay | Admitting: Ophthalmology

## 2020-09-24 DIAGNOSIS — H35033 Hypertensive retinopathy, bilateral: Secondary | ICD-10-CM

## 2020-09-24 DIAGNOSIS — H34831 Tributary (branch) retinal vein occlusion, right eye, with macular edema: Secondary | ICD-10-CM | POA: Diagnosis not present

## 2020-09-24 DIAGNOSIS — H25813 Combined forms of age-related cataract, bilateral: Secondary | ICD-10-CM

## 2020-09-24 DIAGNOSIS — I10 Essential (primary) hypertension: Secondary | ICD-10-CM | POA: Diagnosis not present

## 2020-09-24 DIAGNOSIS — H3581 Retinal edema: Secondary | ICD-10-CM

## 2020-09-24 MED ORDER — BEVACIZUMAB CHEMO INJECTION 1.25MG/0.05ML SYRINGE FOR KALEIDOSCOPE
1.2500 mg | INTRAVITREAL | Status: AC | PRN
  Administered 2020-09-24: 1.25 mg via INTRAVITREAL

## 2020-10-27 ENCOUNTER — Other Ambulatory Visit: Payer: Self-pay

## 2020-10-28 ENCOUNTER — Encounter: Payer: Self-pay | Admitting: Internal Medicine

## 2020-10-28 ENCOUNTER — Ambulatory Visit: Payer: Medicare PPO | Admitting: Internal Medicine

## 2020-10-28 VITALS — BP 144/86 | HR 77 | Temp 98.2°F | Ht 66.0 in | Wt 137.0 lb

## 2020-10-28 DIAGNOSIS — N1832 Chronic kidney disease, stage 3b: Secondary | ICD-10-CM

## 2020-10-28 DIAGNOSIS — E785 Hyperlipidemia, unspecified: Secondary | ICD-10-CM | POA: Insufficient documentation

## 2020-10-28 DIAGNOSIS — Z0000 Encounter for general adult medical examination without abnormal findings: Secondary | ICD-10-CM | POA: Diagnosis not present

## 2020-10-28 DIAGNOSIS — Z1231 Encounter for screening mammogram for malignant neoplasm of breast: Secondary | ICD-10-CM | POA: Insufficient documentation

## 2020-10-28 DIAGNOSIS — Z23 Encounter for immunization: Secondary | ICD-10-CM | POA: Diagnosis not present

## 2020-10-28 DIAGNOSIS — I10 Essential (primary) hypertension: Secondary | ICD-10-CM | POA: Insufficient documentation

## 2020-10-28 NOTE — Progress Notes (Signed)
Pt has been informed that the recommended vaccine TDAP, may not be covered under their current Medicare insurance. Discussed that they will possibly incur a bill for the TDAP vaccine & administration fee.  Pt understands that if they want the TDAP vaccine, Medicare will be billed for an official decision on payment, which will be sent to them in a Medicare Summary Notice (MSN). They understand that if Medicare does not pay, they are responsible for payment.     

## 2020-10-28 NOTE — Patient Instructions (Signed)

## 2020-10-28 NOTE — Progress Notes (Addendum)
Subjective:  Patient ID: Emily Reid, female    DOB: 06-23-1946  Age: 75 y.o. MRN: [id number]  CC: New Patient (Initial Visit), Annual Exam, Hypertension, and Hyperlipidemia  This visit occurred during the SARS-CoV-2 public health emergency.  Safety protocols were in place, including screening questions prior to the visit, additional usage of staff PPE, and extensive cleaning of exam room while observing appropriate contact time as indicated for disinfecting solutions.    HPI Emily Reid presents for a CPX, establishing, and f/up.  She has a history of hypertension.  She tells me her blood pressure is well controlled.  She is very active and denies any recent episodes of chest pain, shortness of breath, palpitations, edema, fatigue, or diaphoresis.   History [name] has a past medical history of Hyperlipidemia, Hypertension, and Urine incontinence.   She has a past surgical history that includes Facial cosmetic surgery and Tonsillectomy (5956).   Her family history includes Heart disease in her mother; Hyperlipidemia in her mother; Hypertension in her mother; Stroke in her mother.She reports that she has never smoked. She has never used smokeless tobacco. She reports current alcohol use of about 7.0 standard drinks of alcohol per week. She reports that she does not use drugs.  Outpatient Medications Prior to Visit  Medication Sig Dispense Refill  . buPROPion (WELLBUTRIN XL) 150 MG 24 hr tablet Take 150 mg by mouth daily.    . Cholecalciferol (VITAMIN D PO) Take 1 capsule by mouth daily.    Marland Kitchen ibuprofen (ADVIL,MOTRIN) 200 MG tablet Take 200 mg by mouth every 6 (six) hours as needed. For pain.    Marland Kitchen LOSARTAN POTASSIUM PO Take 1 tablet by mouth daily.    . Multiple Vitamin (MULTIVITAMIN WITH MINERALS) TABS Take 1 tablet by mouth daily.    . Omega-3 Fatty Acids (FISH OIL PO) Take 1 capsule by mouth 5 (five) times daily.    . sertraline (ZOLOFT) 50 MG tablet Take 50 mg by mouth  daily.    . simvastatin (ZOCOR) 80 MG tablet Take 80 mg by mouth at bedtime.    . vitamin C (ASCORBIC ACID) 500 MG tablet Take 500 mg by mouth daily.    Marland Kitchen amoxicillin-clavulanate (AUGMENTIN) 875-125 MG per tablet Take 1 tablet by mouth 2 (two) times daily. Take for 10 days.  First dose 12/16/2011. (Patient not taking: Reported on 10/28/2020)     No facility-administered medications prior to visit.    ROS Review of Systems  Constitutional: Negative.  Negative for appetite change, diaphoresis and fatigue.  HENT: Negative.   Eyes: Negative.   Respiratory: Negative for cough, chest tightness, shortness of breath and wheezing.   Cardiovascular: Negative for chest pain, palpitations and leg swelling.  Gastrointestinal: Negative for abdominal pain, constipation, diarrhea, nausea and vomiting.  Endocrine: Negative.   Genitourinary: Negative.  Negative for difficulty urinating.  Musculoskeletal: Negative for arthralgias and myalgias.  Skin: Negative.  Negative for color change and pallor.  Neurological: Negative.  Negative for dizziness, weakness, light-headedness and numbness.  Hematological: Negative for adenopathy. Does not bruise/bleed easily.  Psychiatric/Behavioral: Negative.     Objective:  BP (!) 144/86 (BP Location: Right Arm, Patient Position: Sitting, Cuff Size: Normal)   Pulse 77   Temp 98.2 F (36.8 C) (Oral)   Ht 5\' 6"  (1.676 m)   Wt 137 lb (62.1 kg)   SpO2 99%   BMI 22.11 kg/m   Physical Exam Vitals reviewed.  HENT:     Nose: Nose normal.  Mouth/Throat:     Mouth: Mucous membranes are moist.  Eyes:     General: No scleral icterus.    Conjunctiva/sclera: Conjunctivae normal.  Cardiovascular:     Rate and Rhythm: Normal rate and regular rhythm.     Heart sounds: No murmur heard.     Comments: EKG- NSR, 75 bpm Normal EKG Pulmonary:     Effort: Pulmonary effort is normal.     Breath sounds: No stridor. No wheezing, rhonchi or rales.  Abdominal:     General:  Abdomen is flat. Bowel sounds are normal. There is no distension.     Palpations: Abdomen is soft. There is no hepatomegaly, splenomegaly or mass.     Tenderness: There is no abdominal tenderness. There is no guarding.  Musculoskeletal:     Cervical back: Neck supple.     Right lower leg: No edema.     Left lower leg: No edema.  Lymphadenopathy:     Cervical: No cervical adenopathy.  Skin:    General: Skin is warm and dry.  Neurological:     General: No focal deficit present.     Mental Status: She is alert.  Psychiatric:        Mood and Affect: Mood normal.        Behavior: Behavior normal.     Lab Results  Component Value Date   WBC 4.0 11/04/2020   HGB 13.2 11/04/2020   HCT 38.6 11/04/2020   PLT 272.0 11/04/2020   GLUCOSE 100 (H) 11/04/2020   CHOL 215 (H) 11/04/2020   TRIG 67.0 11/04/2020   HDL 100.00 11/04/2020   LDLCALC 102 (H) 11/04/2020   ALT 16 11/04/2020   AST 23 11/04/2020   NA 137 11/04/2020   K 4.4 11/04/2020   CL 102 11/04/2020   CREATININE 1.12 11/04/2020   BUN 20 11/04/2020   CO2 29 11/04/2020   TSH 2.60 11/04/2020    Assessment & Plan:   Dhanya was seen today for new patient (initial visit), annual exam, hypertension and hyperlipidemia.  Diagnoses and all orders for this visit:  Primary hypertension- Her blood pressure is adequately well controlled.  Her EKG is reassuring.  I will monitor her electrolytes and renal function. -     EKG 12-Lead -     CBC with Differential/Platelet; Future -     Basic metabolic panel; Future -     TSH; Future  Visit for screening mammogram -     MM DIGITAL SCREENING BILATERAL; Future  Hyperlipidemia LDL goal <130- I will check a fasting lipid panel to see if she has achieved her LDL goal. -     Lipid panel; Future -     Hepatic function panel; Future  Routine general medical examination at a health care facility- Exam completed, labs ordered, she tells me her pneumonia vaccines are up-to-date, she agreed to a  tetanus booster, cancer screenings addressed, patient education was given.  Other orders -     Tdap vaccine greater than or equal to 7yo IM   I am having Emily Reid maintain her LOSARTAN POTASSIUM PO, simvastatin, sertraline, buPROPion, Omega-3 Fatty Acids (FISH OIL PO), multivitamin with minerals, ibuprofen, amoxicillin-clavulanate, vitamin C, and Cholecalciferol (VITAMIN D PO).  No orders of the defined types were placed in this encounter.    Follow-up: Return in about 6 months (around 04/30/2021).  Sanda Linger, MD

## 2020-10-31 DIAGNOSIS — Z0000 Encounter for general adult medical examination without abnormal findings: Secondary | ICD-10-CM | POA: Insufficient documentation

## 2020-11-04 ENCOUNTER — Other Ambulatory Visit (INDEPENDENT_AMBULATORY_CARE_PROVIDER_SITE_OTHER): Payer: Medicare PPO

## 2020-11-04 ENCOUNTER — Encounter: Payer: Self-pay | Admitting: Internal Medicine

## 2020-11-04 ENCOUNTER — Other Ambulatory Visit: Payer: Self-pay | Admitting: Internal Medicine

## 2020-11-04 DIAGNOSIS — E785 Hyperlipidemia, unspecified: Secondary | ICD-10-CM | POA: Diagnosis not present

## 2020-11-04 DIAGNOSIS — I10 Essential (primary) hypertension: Secondary | ICD-10-CM

## 2020-11-04 DIAGNOSIS — N1832 Chronic kidney disease, stage 3b: Secondary | ICD-10-CM

## 2020-11-04 LAB — CBC WITH DIFFERENTIAL/PLATELET
Basophils Absolute: 0 10*3/uL (ref 0.0–0.1)
Basophils Relative: 0.5 % (ref 0.0–3.0)
Eosinophils Absolute: 0.3 10*3/uL (ref 0.0–0.7)
Eosinophils Relative: 7.6 % — ABNORMAL HIGH (ref 0.0–5.0)
HCT: 38.6 % (ref 36.0–46.0)
Hemoglobin: 13.2 g/dL (ref 12.0–15.0)
Lymphocytes Relative: 28.6 % (ref 12.0–46.0)
Lymphs Abs: 1.2 10*3/uL (ref 0.7–4.0)
MCHC: 34.2 g/dL (ref 30.0–36.0)
MCV: 92.4 fl (ref 78.0–100.0)
Monocytes Absolute: 0.3 10*3/uL (ref 0.1–1.0)
Monocytes Relative: 8.1 % (ref 3.0–12.0)
Neutro Abs: 2.2 10*3/uL (ref 1.4–7.7)
Neutrophils Relative %: 55.2 % (ref 43.0–77.0)
Platelets: 272 10*3/uL (ref 150.0–400.0)
RBC: 4.17 Mil/uL (ref 3.87–5.11)
RDW: 12.6 % (ref 11.5–15.5)
WBC: 4 10*3/uL (ref 4.0–10.5)

## 2020-11-04 LAB — HEPATIC FUNCTION PANEL
ALT: 16 U/L (ref 0–35)
AST: 23 U/L (ref 0–37)
Albumin: 4.6 g/dL (ref 3.5–5.2)
Alkaline Phosphatase: 45 U/L (ref 39–117)
Bilirubin, Direct: 0.1 mg/dL (ref 0.0–0.3)
Total Bilirubin: 0.4 mg/dL (ref 0.2–1.2)
Total Protein: 7.2 g/dL (ref 6.0–8.3)

## 2020-11-04 LAB — BASIC METABOLIC PANEL
BUN: 20 mg/dL (ref 6–23)
CO2: 29 mEq/L (ref 19–32)
Calcium: 9.9 mg/dL (ref 8.4–10.5)
Chloride: 102 mEq/L (ref 96–112)
Creatinine, Ser: 1.12 mg/dL (ref 0.40–1.20)
GFR: 48.41 mL/min — ABNORMAL LOW (ref >60.00)
Glucose, Bld: 100 mg/dL — ABNORMAL HIGH (ref 70–99)
Potassium: 4.4 mEq/L (ref 3.5–5.1)
Sodium: 137 mEq/L (ref 135–145)

## 2020-11-04 LAB — LIPID PANEL
Cholesterol: 215 mg/dL — ABNORMAL HIGH (ref 0–200)
HDL: 100 mg/dL (ref >39.00)
LDL Cholesterol: 102 mg/dL — ABNORMAL HIGH (ref 0–99)
NonHDL: 115.01
Total CHOL/HDL Ratio: 2
Triglycerides: 67 mg/dL (ref 0.0–149.0)
VLDL: 13.4 mg/dL (ref 0.0–40.0)

## 2020-11-04 LAB — TSH: TSH: 2.6 u[IU]/mL (ref 0.35–4.50)

## 2020-11-12 ENCOUNTER — Other Ambulatory Visit: Payer: Self-pay | Admitting: Internal Medicine

## 2020-11-12 DIAGNOSIS — Z1231 Encounter for screening mammogram for malignant neoplasm of breast: Secondary | ICD-10-CM

## 2020-11-12 NOTE — Progress Notes (Addendum)
[facility] - Clinic Note  11/17/2020     CHIEF COMPLAINT Patient presents for Retina Follow Up   HISTORY OF PRESENT ILLNESS: Emily Reid is a 75 y.o. female who presents to the clinic today for:   HPI    Retina Follow Up    Patient presents with  CRVO/BRVO.  In right eye.  This started months ago.  Severity is mild.  Duration of 7 weeks.  Since onset it is stable.  I, the attending physician,  performed the HPI with the patient and updated documentation appropriately.          Comments    75 y/o female pt here for 7 wk f/u for BRVO OD.  No change in [place] OU.  Denies pain, FOL, floaters.  No gtts.       Last edited by Rennis Chris, MD on 11/17/2020  8:41 PM. (History)    pt states no change in vision, pt recently had blood work and was found to have a slight kidney dysfunction, she is supposed to be seeing a specialist soon  Referring physician: Lois Huxley MD [address] [place], [place] [address]  HISTORICAL INFORMATION:   Selected notes from the MEDICAL RECORD NUMBER Referred by Dr. Harless Litten for continuation of eye injections OD for BRVO with edema LEE: 08/05/2020 Ocular Hx- BRVO, CME, iritis OD PMH- HTN    CURRENT MEDICATIONS: No current outpatient medications on file. (Ophthalmic Drugs)   No current facility-administered medications for this visit. (Ophthalmic Drugs)   Current Outpatient Medications (Other)  Medication Sig  . buPROPion (WELLBUTRIN XL) 150 MG 24 hr tablet Take 150 mg by mouth daily.  . Cholecalciferol (VITAMIN D PO) Take 1 capsule by mouth daily.  . ergocalciferol (VITAMIN D2) 1.25 MG (50000 UT) capsule Take by mouth.  Marland Kitchen LOSARTAN POTASSIUM PO Take 1 tablet by mouth daily.  . Multiple Vitamin (MULTIVITAMIN WITH MINERALS) TABS Take 1 tablet by mouth daily.  . Omega-3 Fatty Acids (FISH OIL PO) Take 1 capsule by mouth 5 (five) times daily.  . sertraline (ZOLOFT) 50 MG tablet Take 50 mg by mouth daily.  . simvastatin  (ZOCOR) 80 MG tablet Take 80 mg by mouth at bedtime.  . vitamin C (ASCORBIC ACID) 500 MG tablet Take 500 mg by mouth daily.   No current facility-administered medications for this visit. (Other)      REVIEW OF SYSTEMS: ROS    Positive for: Genitourinary, Eyes   Negative for: Constitutional, Gastrointestinal, Neurological, Skin, Musculoskeletal, HENT, Cardiovascular, Respiratory, Psychiatric, Allergic/Imm, Heme/Lymph   Last edited by Celine Mans, COA on 11/17/2020  2:08 PM. (History)       ALLERGIES No Known Allergies  PAST MEDICAL HISTORY Past Medical History:  Diagnosis Date  . Cataract    Mixed OU  . Hyperlipidemia   . Hypertension   . Hypertensive retinopathy    OU  . Urine incontinence    Past Surgical History:  Procedure Laterality Date  . FACIAL COSMETIC SURGERY    . TONSILLECTOMY  1957    FAMILY HISTORY Family History  Problem Relation Age of Onset  . Heart disease Mother   . Hyperlipidemia Mother   . Hypertension Mother   . Stroke Mother     SOCIAL HISTORY Social History   Tobacco Use  . Smoking status: Never Smoker  . Smokeless tobacco: Never Used  Substance Use Topics  . Alcohol use: Yes    Alcohol/week: 7.0 standard drinks  Types: 7 Glasses of wine per week  . Drug use: No         OPHTHALMIC EXAM:  Base Eye Exam    Visual Acuity (Snellen - Linear)      Right Left   Dist cc 20/20 - 20/20 -   Correction: Glasses       Tonometry (Tonopen, 2:09 PM)      Right Left   Pressure 18 16       Pupils      Dark Light Shape React APD   Right 3 2 Round Brisk None   Left 3 2 Round Brisk None       Visual Fields (Counting fingers)      Left Right    Full Full       Extraocular Movement      Right Left    Full, Ortho Full, Ortho       Neuro/Psych    Oriented x3: Yes   Mood/Affect: Normal       Dilation    Both eyes: 1.0% Mydriacyl, 2.5% Phenylephrine @ 2:09 PM        Slit Lamp and Fundus Exam    Slit Lamp Exam       Right Left   Lids/Lashes Dermatochalasis - upper lid Dermatochalasis - upper lid   Conjunctiva/Sclera White and quiet White and quiet   Cornea 2+ Punctate epithelial erosions, early band K nasal and temporal 2+ Punctate epithelial erosions, early band K nasal and temporal   Anterior Chamber Deep and quiet Deep and quiet   Iris Round and dilated Round and dilated   Lens 2+ Nuclear sclerosis, 1-2+ Cortical cataract, focal Posterior subcapsular cataract at 0700 2+ Nuclear sclerosis, 2+ Cortical cataract, focal Posterior subcapsular cataract at 0700   Vitreous Vitreous syneresis, silicone oil micro bubbles Vitreous syneresis, silicone oil micro bubbles, Posterior vitreous detachment       Fundus Exam      Right Left   Disc Pink and Sharp, mild PPA Pink and Sharp   C/D Ratio 0.3 0.2   Macula Flat, Blunted foveal reflex, Drusen, RPE mottling, No heme or edema Flat, Blunted foveal reflex, Drusen, RPE mottling, No heme or edema   Vessels attenuated, mild tortuousity attenuated, mild tortuousity   Periphery Attached, no heme    Attached, mild mid zonal drusen, no heme             IMAGING AND PROCEDURES  Imaging and Procedures for 11/17/2020  OCT, Retina - OU - Both Eyes       Right Eye Quality was good. Central Foveal Thickness: 292. Progression has been stable. Findings include normal foveal contour, no IRF, no SRF (Hx of BRVO, no CME today, Irregular lamination inferior macula).   Left Eye Quality was good. Central Foveal Thickness: 300. Progression has been stable. Findings include normal foveal contour, no IRF, no SRF, retinal drusen .   Notes *Images captured and stored on drive  Diagnosis / Impression:  OD: Hx of BRVO, no CME today, Irregular lamination inferior macula OS: NFP, no IRF/SRF, +drusen  Clinical management:  See below  Abbreviations: NFP - Normal foveal profile. CME - cystoid macular edema. PED - pigment epithelial detachment. IRF - intraretinal fluid. SRF -  subretinal fluid. EZ - ellipsoid zone. ERM - epiretinal membrane. ORA - outer retinal atrophy. ORT - outer retinal tubulation. SRHM - subretinal hyper-reflective material. IRHM - intraretinal hyper-reflective material        Intravitreal Injection, Pharmacologic Agent - OD -  Right Eye       Time Out 11/17/2020. 2:37 PM. Confirmed correct patient, procedure, site, and patient consented.   Anesthesia Topical anesthesia was used. Anesthetic medications included Lidocaine 2%, Proparacaine 0.5%.   Procedure Preparation included 5% betadine to ocular surface, eyelid speculum. A supplied needle was used.   Injection:  1.25 mg Bevacizumab (AVASTIN) 1.25mg /0.5405mL SOLN   NDC: 60454-098-1150242-060-01, Lot: [id number] , Expiration date: 01/10/2021   Route: Intravitreal, Site: Right Eye, Waste: 0 mL  Post-op Post injection exam found visual acuity of at least counting fingers. The patient tolerated the procedure well. There were no complications. The patient received written and verbal post procedure care education. Post injection medications were not given.                 ASSESSMENT/PLAN:    ICD-10-CM   1. Branch retinal vein occlusion of right eye with macular edema  H34.8310 Intravitreal Injection, Pharmacologic Agent - OD - Right Eye    Bevacizumab (AVASTIN) SOLN 1.25 mg  2. Retinal edema  H35.81 OCT, Retina - OU - Both Eyes  3. Essential hypertension  I10   4. Hypertensive retinopathy of both eyes  H35.033   5. Combined forms of age-related cataract of both eyes  H25.813     1,2. BRVO w/ CME OD             - s/p IVA OD #1 (02.04.22)  - [place] of injections with [place]. [place] -- pt was receiving Lucentis q7-8 wks; pt reports history Avastin, but was switched to Lucentis at some point - last injection IVL OD was 02.04.22 (7.5 wks) - pt reports hx of worsening vision and OCT around 10 weeks - BCVA OD is 20/20 - OCT again shows no CME today, Irregular lamination inferior macula  at 8 wks - recommend IVA OD #2 today, 05.23.22 -- maintenance therapy w ext to 9 wks - RBA of procedure discussed, questions answered - informed consent obtained and signed - see procedure note - F/U 9 weeks -- DFE/OCT/possible injection  3,4. Hypertensive retinopathy OU - discussed importance of tight BP control - monitor  5. Mixed Cataract OU - The symptoms of cataract, surgical options, and treatments and risks were discussed with patient. - discussed diagnosis and progression - not yet visually significant - monitor for now  Ophthalmic Meds Ordered this visit:  Meds ordered this encounter  Medications  . Bevacizumab (AVASTIN) SOLN 1.25 mg      Return in about 9 weeks (around 01/19/2021) for f/u BRVO OD, DFE, OCT.  There are no Patient Instructions on file for this visit.   This document serves as a record of services personally performed by Karie ChimeraBrian G. Ewan Grau, MD, PhD. It was created on their behalf by Herby AbrahamAshley English, COA, an ophthalmic technician. The creation of this record is the provider's dictation and/or activities during the visit.    Electronically signed by: Herby AbrahamAshley English, COA @TODAY @ 8:50 PM   This document serves as a record of services personally performed by Karie ChimeraBrian G. Suzanna Zahn, MD, PhD. It was created on their behalf by Glee ArvinAmanda J. Manson PasseyBrown, OA an ophthalmic technician. The creation of this record is the provider's dictation and/or activities during the visit.    Electronically signed by: Glee ArvinAmanda J. Manson PasseyBrown, [place] 05.23.2022 8:50 PM  Karie ChimeraBrian G. Huyen Perazzo, M.D., Ph.D. Diseases & Surgery of the Retina and Vitreous [facility] 11/17/2020   I have reviewed the above documentation for accuracy and completeness, and I  agree with the above. Karie Chimera, M.D., Ph.D. 11/17/20 8:50 PM   Abbreviations: M myopia (nearsighted); A astigmatism; H hyperopia (farsighted); P presbyopia; Mrx spectacle prescription;  CTL contact lenses; OD right eye; OS left eye; OU  both eyes  XT exotropia; ET esotropia; PEK punctate epithelial keratitis; PEE punctate epithelial erosions; DES dry eye syndrome; MGD meibomian gland dysfunction; ATs artificial tears; PFAT's preservative free artificial tears; NSC nuclear sclerotic cataract; PSC posterior subcapsular cataract; ERM epi-retinal membrane; PVD posterior vitreous detachment; RD retinal detachment; DM diabetes mellitus; DR diabetic retinopathy; NPDR non-proliferative diabetic retinopathy; PDR proliferative diabetic retinopathy; CSME clinically significant macular edema; DME diabetic macular edema; dbh dot blot hemorrhages; CWS cotton wool spot; POAG primary open angle glaucoma; C/D cup-to-disc ratio; HVF humphrey visual field; GVF goldmann visual field; OCT optical coherence tomography; IOP intraocular pressure; BRVO Branch retinal vein occlusion; CRVO central retinal vein occlusion; CRAO central retinal artery occlusion; BRAO branch retinal artery occlusion; RT retinal tear; SB scleral buckle; PPV pars plana vitrectomy; VH Vitreous hemorrhage; PRP panretinal laser photocoagulation; IVK intravitreal kenalog; VMT vitreomacular traction; MH Macular hole;  NVD neovascularization of the disc; NVE neovascularization elsewhere; AREDS age related eye disease study; ARMD age related macular degeneration; POAG primary open angle glaucoma; EBMD epithelial/anterior basement membrane dystrophy; ACIOL anterior chamber intraocular lens; IOL intraocular lens; PCIOL posterior chamber intraocular lens; Phaco/IOL phacoemulsification with intraocular lens placement; PRK photorefractive keratectomy; LASIK laser assisted in situ keratomileusis; HTN hypertension; DM diabetes mellitus; COPD chronic obstructive pulmonary disease

## 2020-11-17 ENCOUNTER — Encounter (INDEPENDENT_AMBULATORY_CARE_PROVIDER_SITE_OTHER): Payer: Self-pay | Admitting: Ophthalmology

## 2020-11-17 ENCOUNTER — Other Ambulatory Visit: Payer: Self-pay

## 2020-11-17 ENCOUNTER — Ambulatory Visit (INDEPENDENT_AMBULATORY_CARE_PROVIDER_SITE_OTHER): Payer: Medicare PPO | Admitting: Ophthalmology

## 2020-11-17 DIAGNOSIS — H25813 Combined forms of age-related cataract, bilateral: Secondary | ICD-10-CM

## 2020-11-17 DIAGNOSIS — H34831 Tributary (branch) retinal vein occlusion, right eye, with macular edema: Secondary | ICD-10-CM

## 2020-11-17 DIAGNOSIS — H35033 Hypertensive retinopathy, bilateral: Secondary | ICD-10-CM | POA: Diagnosis not present

## 2020-11-17 DIAGNOSIS — I10 Essential (primary) hypertension: Secondary | ICD-10-CM

## 2020-11-17 DIAGNOSIS — H3581 Retinal edema: Secondary | ICD-10-CM | POA: Diagnosis not present

## 2020-11-17 MED ORDER — BEVACIZUMAB CHEMO INJECTION 1.25MG/0.05ML SYRINGE FOR KALEIDOSCOPE
1.2500 mg | INTRAVITREAL | Status: AC | PRN
  Administered 2020-11-17: 1.25 mg via INTRAVITREAL

## 2020-11-18 ENCOUNTER — Encounter: Payer: Self-pay | Admitting: Internal Medicine

## 2020-11-27 ENCOUNTER — Other Ambulatory Visit: Payer: Self-pay

## 2020-11-27 ENCOUNTER — Ambulatory Visit: Payer: Medicare PPO | Admitting: Emergency Medicine

## 2020-11-27 ENCOUNTER — Encounter: Payer: Self-pay | Admitting: Emergency Medicine

## 2020-11-27 VITALS — BP 120/60 | HR 78 | Temp 98.7°F | Ht 66.0 in | Wt 141.4 lb

## 2020-11-27 DIAGNOSIS — N1832 Chronic kidney disease, stage 3b: Secondary | ICD-10-CM | POA: Diagnosis not present

## 2020-11-27 DIAGNOSIS — E785 Hyperlipidemia, unspecified: Secondary | ICD-10-CM | POA: Diagnosis not present

## 2020-11-27 DIAGNOSIS — I10 Essential (primary) hypertension: Secondary | ICD-10-CM | POA: Diagnosis not present

## 2020-11-27 DIAGNOSIS — R200 Anesthesia of skin: Secondary | ICD-10-CM | POA: Diagnosis not present

## 2020-11-27 NOTE — Progress Notes (Signed)
Emily Reid 75 y.o.   Chief Complaint  Patient presents with  . neuopathy    Fingers and toes  x 6 months - Dr Camila Li patient    HISTORY OF PRESENT ILLNESS: This is a 75 y.o. female nondiabetic complaining of numbness over toes for a couple months. Saw chiropractor about 2 months ago who did lumbar spine x-ray and was told she could have lumbar disc disease. Was advised to see orthopedist and or get MRI of lumbar spine. Patient is physically active, walks 2 to 3 miles daily.  Eat well.  Not overweight. No other complaints or medical concerns today. Recent blood work results discussed with patient.  Shows mild kidney dysfunction with GFR of 48 and mild elevation of cholesterol.  Otherwise unremarkable labs with acceptable values.  HPI   Prior to Admission medications   Medication Sig Start Date End Date Taking? Authorizing Provider  buPROPion (WELLBUTRIN XL) 150 MG 24 hr tablet Take 150 mg by mouth daily.   Yes [provider]  Cholecalciferol (VITAMIN D PO) Take 1 capsule by mouth daily.   Yes [provider]  Coenzyme Q10 (COQ10) 100 MG CAPS Take by mouth daily.   Yes [provider]  ergocalciferol (VITAMIN D2) 1.25 MG (50000 UT) capsule Take by mouth.   Yes [provider]  LOSARTAN POTASSIUM PO Take 1 tablet by mouth daily.   Yes [provider]  mirtazapine (REMERON) 15 MG tablet Take 15 mg by mouth at bedtime.   Yes [provider]  Multiple Vitamin (MULTIVITAMIN WITH MINERALS) TABS Take 1 tablet by mouth daily.   Yes [provider]  Multiple Vitamin (MULTIVITAMIN) tablet Take 1 tablet by mouth daily.   Yes [provider]  Omega-3 Fatty Acids (FISH OIL PO) Take 1 capsule by mouth 5 (five) times daily.   Yes [provider]  OVER THE COUNTER MEDICATION    Yes [provider]  OVER THE COUNTER MEDICATION    Yes [provider]  Phosphatidylserine 100 MG CAPS Take by mouth.    Yes [provider]  sertraline (ZOLOFT) 50 MG tablet Take 50 mg by mouth daily.   Yes [provider]  simvastatin (ZOCOR) 80 MG tablet Take 80 mg by mouth at bedtime.   Yes [provider]  Turmeric (QC TUMERIC COMPLEX PO) Take 553 mg by mouth daily.   Yes [provider]  vitamin C (ASCORBIC ACID) 500 MG tablet Take 500 mg by mouth daily.   Yes [provider]    No Known Allergies  Patient Active Problem List   Diagnosis Date Noted  . Stage 3b chronic kidney disease ([place]) 11/04/2020  . Routine general medical examination at a health care facility 10/31/2020  . Visit for screening mammogram 10/28/2020  . Primary hypertension 10/28/2020  . Hyperlipidemia LDL goal <130 10/28/2020  . HIP PAIN 12/10/2008  . ILIOTIBIAL BAND SYNDROME 12/10/2008    Past Medical History:  Diagnosis Date  . Cataract    Mixed OU  . Hyperlipidemia   . Hypertension   . Hypertensive retinopathy    OU  . Urine incontinence     Past Surgical History:  Procedure Laterality Date  . FACIAL COSMETIC SURGERY    . TONSILLECTOMY  1957    Social History   Socioeconomic History  . Marital status: Married    Spouse name: Not on file  . Number of children: Not on file  . Years of education: Not on file  .  Highest education level: Not on file  Occupational History  . Not on file  Tobacco Use  . Smoking status: Never Smoker  . Smokeless tobacco: Never Used  Substance and Sexual Activity  . Alcohol use: Yes    Alcohol/week: 7.0 standard drinks    Types: 7 Glasses of wine per week  . Drug use: No  . Sexual activity: Yes    Partners: Male  Other Topics Concern  . Not on file  Social History Narrative  . Not on file   Social Determinants of Health   Financial Resource Strain: Not on file  Food Insecurity: Not on file  Transportation Needs: Not on file  Physical Activity: Not on file  Stress: Not on file  Social Connections: Not on file  Intimate  Partner Violence: Not on file    Family History  Problem Relation Age of Onset  . Heart disease Mother   . Hyperlipidemia Mother   . Hypertension Mother   . Stroke Mother      Review of Systems  Constitutional: Negative.  Negative for chills and fever.  HENT: Negative.  Negative for congestion and sore throat.   Respiratory: Negative.  Negative for cough and shortness of breath.   Cardiovascular: Negative.  Negative for chest pain and palpitations.  Gastrointestinal: Negative.  Negative for abdominal pain, diarrhea, nausea and vomiting.  Genitourinary: Negative.  Negative for dysuria and hematuria.  Musculoskeletal: Negative.  Negative for back pain, myalgias and neck pain.  Skin: Negative.  Negative for rash.  Neurological: Negative.  Negative for dizziness and headaches.  All other systems reviewed and are negative.    Physical Exam Vitals reviewed.  Constitutional:      Appearance: Normal appearance.  HENT:     Head: Normocephalic.  Eyes:     Extraocular Movements: Extraocular movements intact.     Pupils: Pupils are equal, round, and reactive to light.  Cardiovascular:     Rate and Rhythm: Normal rate and regular rhythm.     Pulses: Normal pulses.     Heart sounds: Normal heart sounds.  Pulmonary:     Effort: Pulmonary effort is normal.     Breath sounds: Normal breath sounds.  Abdominal:     Palpations: Abdomen is soft.     Tenderness: There is no abdominal tenderness.  Musculoskeletal:     Cervical back: Normal range of motion and neck supple.     Thoracic back: Normal.     Lumbar back: Normal.     Right lower leg: No edema.     Left lower leg: No edema.     Comments: Lower extremities: Warm to touch.  No erythema or swelling.  No tenderness.  Excellent peripheral circulation and capillary refill.  Good sensation.  Normal reflexes.  Skin:    General: Skin is warm.     Capillary Refill: Capillary refill takes less than 2 seconds.  Neurological:      General: No focal deficit present.     Mental Status: She is alert and oriented to person, place, and time.  Psychiatric:        Mood and Affect: Mood normal.        Behavior: Behavior normal.      ASSESSMENT & PLAN: Numbness of toes Differential diagnosis discussed with patient.  Nondiabetic.  Good peripheral circulation and good sensation with intact reflexes.  Needs orthopedic evaluation and possible lumbar spine MRI.  Harshita was seen today for neuopathy.  Diagnoses and all orders for this  visit:  Numbness of toes -     Ambulatory referral to Orthopedic Surgery  Stage 3b chronic kidney disease ([place])  Primary hypertension  Hyperlipidemia LDL goal <130    Patient Instructions   Preventive Care 63 Years and Older, Female Preventive care refers to lifestyle choices and visits with your health care provider that can promote health and wellness. This includes:  A yearly physical exam. This is also called an annual wellness visit.  Regular dental and eye exams.  Immunizations.  Screening for certain conditions.  Healthy lifestyle choices, such as: ? Eating a healthy diet. ? Getting regular exercise. ? Not using drugs or products that contain nicotine and tobacco. ? Limiting alcohol use. What can I expect for my preventive care visit? Physical exam Your health care provider will check your:  Height and weight. These may be used to calculate your BMI (body mass index). BMI is a measurement that tells if you are at a healthy weight.  Heart rate and blood pressure.  Body temperature.  Skin for abnormal spots. Counseling Your health care provider may ask you questions about your:  Past medical problems.  Family's medical history.  Alcohol, tobacco, and drug use.  Emotional well-being.  Home life and relationship well-being.  Sexual activity.  Diet, exercise, and sleep habits.  History of falls.  Memory and ability to understand (cognition).  Work  and work Statistician.  Pregnancy and menstrual history.  Access to firearms. What immunizations do I need? Vaccines are usually given at various ages, according to a schedule. Your health care provider will recommend vaccines for you based on your age, medical history, and lifestyle or other factors, such as travel or where you work.   What tests do I need? Blood tests  Lipid and cholesterol levels. These may be checked every 5 years, or more often depending on your overall health.  Hepatitis C test.  Hepatitis B test. Screening  Lung cancer screening. You may have this screening every year starting at age 67 if you have a 30-pack-year history of smoking and currently smoke or have quit within the past 15 years.  Colorectal cancer screening. ? All adults should have this screening starting at age 20 and continuing until age 61. ? Your health care provider may recommend screening at age 39 if you are at increased risk. ? You will have tests every 1-10 years, depending on your results and the type of screening test.  Diabetes screening. ? This is done by checking your blood sugar (glucose) after you have not eaten for a while (fasting). ? You may have this done every 1-3 years.  Mammogram. ? This may be done every 1-2 years. ? Talk with your health care provider about how often you should have regular mammograms.  Abdominal aortic aneurysm (AAA) screening. You may need this if you are a current or former smoker.  BRCA-related cancer screening. This may be done if you have a family history of breast, ovarian, tubal, or peritoneal cancers. Other tests  STD (sexually transmitted disease) testing, if you are at risk.  Bone density scan. This is done to screen for osteoporosis. You may have this done starting at age 62. Talk with your health care provider about your test results, treatment options, and if necessary, the need for more tests. Follow these instructions at home: Eating  and drinking  Eat a diet that includes fresh fruits and vegetables, whole grains, lean protein, and low-fat dairy products. Limit your intake of foods  with high amounts of sugar, saturated fats, and salt.  Take vitamin and mineral supplements as recommended by your health care provider.  Do not drink alcohol if your health care provider tells you not to drink.  If you drink alcohol: ? Limit how much you have to 0-1 drink a day. ? Be aware of how much alcohol is in your drink. In the [place] one drink equals one 12 oz bottle of beer (355 mL), one 5 oz glass of wine (148 mL), or one 1 oz glass of hard liquor (44 mL).   Lifestyle  Take daily care of your teeth and gums. Brush your teeth every morning and night with fluoride toothpaste. Floss one time each day.  Stay active. Exercise for at least 30 minutes 5 or more days each week.  Do not use any products that contain nicotine or tobacco, such as cigarettes, e-cigarettes, and chewing tobacco. If you need help quitting, ask your health care provider.  Do not use drugs.  If you are sexually active, practice safe sex. Use a condom or other form of protection in order to prevent STIs (sexually transmitted infections).  Talk with your health care provider about taking a low-dose aspirin or statin.  Find healthy ways to cope with stress, such as: ? Meditation, yoga, or listening to music. ? Journaling. ? Talking to a trusted person. ? Spending time with friends and family. Safety  Always wear your seat belt while driving or riding in a vehicle.  Do not drive: ? If you have been drinking alcohol. Do not ride with someone who has been drinking. ? When you are tired or distracted. ? While texting.  Wear a helmet and other protective equipment during sports activities.  If you have firearms in your house, make sure you follow all gun safety procedures. What's next?  Visit your health care provider once a year for an annual wellness  visit.  Ask your health care provider how often you should have your eyes and teeth checked.  Stay up to date on all vaccines. This information is not intended to replace advice given to you by your health care provider. Make sure you discuss any questions you have with your health care provider. Document Revised: 06/04/2020 Document Reviewed: 06/08/2018 Elsevier Patient Education  2021 [place], MD [facility]

## 2020-11-27 NOTE — Patient Instructions (Signed)
Preventive Care 75 Years and Older, Female Preventive care refers to lifestyle choices and visits with your health care provider that can promote health and wellness. This includes:  A yearly physical exam. This is also called an annual wellness visit.  Regular dental and eye exams.  Immunizations.  Screening for certain conditions.  Healthy lifestyle choices, such as: ? Eating a healthy diet. ? Getting regular exercise. ? Not using drugs or products that contain nicotine and tobacco. ? Limiting alcohol use. What can I expect for my preventive care visit? Physical exam Your health care provider will check your:  Height and weight. These may be used to calculate your BMI (body mass index). BMI is a measurement that tells if you are at a healthy weight.  Heart rate and blood pressure.  Body temperature.  Skin for abnormal spots. Counseling Your health care provider may ask you questions about your:  Past medical problems.  Family's medical history.  Alcohol, tobacco, and drug use.  Emotional well-being.  Home life and relationship well-being.  Sexual activity.  Diet, exercise, and sleep habits.  History of falls.  Memory and ability to understand (cognition).  Work and work Statistician.  Pregnancy and menstrual history.  Access to firearms. What immunizations do I need? Vaccines are usually given at various ages, according to a schedule. Your health care provider will recommend vaccines for you based on your age, medical history, and lifestyle or other factors, such as travel or where you work.   What tests do I need? Blood tests  Lipid and cholesterol levels. These may be checked every 5 years, or more often depending on your overall health.  Hepatitis C test.  Hepatitis B test. Screening  Lung cancer screening. You may have this screening every year starting at age 75 if you have a 30-pack-year history of smoking and currently smoke or have quit within  the past 15 years.  Colorectal cancer screening. ? All adults should have this screening starting at age 44 and continuing until age 58. ? Your health care provider may recommend screening at age 2 if you are at increased risk. ? You will have tests every 1-10 years, depending on your results and the type of screening test.  Diabetes screening. ? This is done by checking your blood sugar (glucose) after you have not eaten for a while (fasting). ? You may have this done every 1-3 years.  Mammogram. ? This may be done every 1-2 years. ? Talk with your health care provider about how often you should have regular mammograms.  Abdominal aortic aneurysm (AAA) screening. You may need this if you are a current or former smoker.  BRCA-related cancer screening. This may be done if you have a family history of breast, ovarian, tubal, or peritoneal cancers. Other tests  STD (sexually transmitted disease) testing, if you are at risk.  Bone density scan. This is done to screen for osteoporosis. You may have this done starting at age 75. Talk with your health care provider about your test results, treatment options, and if necessary, the need for more tests. Follow these instructions at home: Eating and drinking  Eat a diet that includes fresh fruits and vegetables, whole grains, lean protein, and low-fat dairy products. Limit your intake of foods with high amounts of sugar, saturated fats, and salt.  Take vitamin and mineral supplements as recommended by your health care provider.  Do not drink alcohol if your health care provider tells you not to drink.  If you drink alcohol: ? Limit how much you have to 0-1 drink a day. ? Be aware of how much alcohol is in your drink. In the [place] one drink equals one 12 oz bottle of beer (355 mL), one 5 oz glass of wine (148 mL), or one 1 oz glass of hard liquor (44 mL).   Lifestyle  Take daily care of your teeth and gums. Brush your teeth every morning  and night with fluoride toothpaste. Floss one time each day.  Stay active. Exercise for at least 30 minutes 5 or more days each week.  Do not use any products that contain nicotine or tobacco, such as cigarettes, e-cigarettes, and chewing tobacco. If you need help quitting, ask your health care provider.  Do not use drugs.  If you are sexually active, practice safe sex. Use a condom or other form of protection in order to prevent STIs (sexually transmitted infections).  Talk with your health care provider about taking a low-dose aspirin or statin.  Find healthy ways to cope with stress, such as: ? Meditation, yoga, or listening to music. ? Journaling. ? Talking to a trusted person. ? Spending time with friends and family. Safety  Always wear your seat belt while driving or riding in a vehicle.  Do not drive: ? If you have been drinking alcohol. Do not ride with someone who has been drinking. ? When you are tired or distracted. ? While texting.  Wear a helmet and other protective equipment during sports activities.  If you have firearms in your house, make sure you follow all gun safety procedures. What's next?  Visit your health care provider once a year for an annual wellness visit.  Ask your health care provider how often you should have your eyes and teeth checked.  Stay up to date on all vaccines. This information is not intended to replace advice given to you by your health care provider. Make sure you discuss any questions you have with your health care provider. Document Revised: 06/04/2020 Document Reviewed: 06/08/2018 Elsevier Patient Education  2021 Elsevier Inc.  

## 2020-11-27 NOTE — Assessment & Plan Note (Signed)
Differential diagnosis discussed with patient.  Nondiabetic.  Good peripheral circulation and good sensation with intact reflexes.  Needs orthopedic evaluation and possible lumbar spine MRI.

## 2020-12-11 ENCOUNTER — Encounter: Payer: Self-pay | Admitting: Family Medicine

## 2020-12-11 ENCOUNTER — Ambulatory Visit (INDEPENDENT_AMBULATORY_CARE_PROVIDER_SITE_OTHER): Payer: Medicare PPO | Admitting: Family Medicine

## 2020-12-11 ENCOUNTER — Other Ambulatory Visit: Payer: Self-pay

## 2020-12-11 DIAGNOSIS — R200 Anesthesia of skin: Secondary | ICD-10-CM

## 2020-12-11 DIAGNOSIS — M47816 Spondylosis without myelopathy or radiculopathy, lumbar region: Secondary | ICD-10-CM | POA: Diagnosis not present

## 2020-12-11 DIAGNOSIS — M4302 Spondylolysis, cervical region: Secondary | ICD-10-CM | POA: Diagnosis not present

## 2020-12-11 DIAGNOSIS — R202 Paresthesia of skin: Secondary | ICD-10-CM | POA: Diagnosis not present

## 2020-12-11 NOTE — Progress Notes (Signed)
Numbness in toes started 6 months ago No known injuries  Has been active her whole life  Not getting worse Sitting long periods causes numbness LB is sore but not bad Been working with a chiropractor for years No Inj Or PT Not taking anything for it

## 2020-12-11 NOTE — Progress Notes (Signed)
Office Visit Note   Patient: Emily Reid           Date of Birth: 06-Nov-1945           MRN: [id number] Visit Date: 12/11/2020 Requested by: Georgina Quint, MD [address] [place],  [place] [address] PCP: Etta Grandchild, MD  Subjective: Chief Complaint  Patient presents with   Lower Back - Pain    HPI: She is here with bilateral foot numbness and tingling.  She recently moved here from [place].  About 6 months ago she started noticing intermittent numbness and tingling in both feet.  She has had chronic neck and low back problems and has done very well with chiropractic adjustments in [place].  Unfortunately the adjustments were not helping with her foot numbness.  She states that in the past, she has had intermittent bilateral arm numbness that would resolve with adjustments of her neck and certain postural maneuvers.  She has never had problems with foot numbness prior to this year.  She went to a chiropractor, Dr. Donnie Coffin, at [facility] here in [place] and took x-rays and recommended against chiropractic adjustments until further imaging was done.  Fortunately, patient is really not in much pain.  No history of diabetes.  She is on cholesterol medicine and blood pressure medicine.               ROS: Denies bowel or bladder dysfunction.  All other systems were reviewed and are negative.  Objective: Vital Signs: There were no vitals taken for this visit.  Physical Exam:  General:  Alert and oriented, in no acute distress. Pulm:  Breathing unlabored. Psy:  Normal mood, congruent affect  Low back: No significant tenderness.  She has negative straight leg raise and good range of motion of both hips.  Lower extremity strength and reflexes are normal.  Slightly diminished light touch sensation across the dorsum of her left foot, all other sensation is normal today.    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  Lumbar x-rays reveal an old anterior wedge deformity of T12.  There is grade 1 anterolisthesis of L4 on L5.  Mild to moderate degenerative disc disease at multiple levels.    Assessment & Plan: Bilateral foot numbness, possibilities include lumbar spinal stenosis, cervical spine stenosis, neuropathy. -We will proceed with MRI of the cervical and lumbar spine.  If unrevealing, then nerve conduction studies followed by lab work-up. -Referral to [facility] PT.     Procedures: No procedures performed        PMFS History: Patient Active Problem List   Diagnosis Date Noted   Numbness of toes 11/27/2020   Stage 3b chronic kidney disease (HCC) 11/04/2020   Routine general medical examination at a health care facility 10/31/2020   Visit for screening mammogram 10/28/2020   Primary hypertension 10/28/2020   Hyperlipidemia LDL goal <130 10/28/2020   HIP PAIN 12/10/2008   ILIOTIBIAL BAND SYNDROME 12/10/2008   Past Medical History:  Diagnosis Date   Cataract    Mixed OU   Hyperlipidemia    Hypertension    Hypertensive retinopathy    OU   Urine incontinence     Family History  Problem Relation Age of Onset   Heart disease Mother    Hyperlipidemia Mother    Hypertension Mother    Stroke Mother     Past Surgical History:  Procedure Laterality Date  FACIAL COSMETIC SURGERY     TONSILLECTOMY  1957   Social History   Occupational History   Not on file  Tobacco Use   Smoking status: Never   Smokeless tobacco: Never  Substance and Sexual Activity   Alcohol use: Yes    Alcohol/week: 7.0 standard drinks    Types: 7 Glasses of wine per week   Drug use: No   Sexual activity: Yes    Partners: Male

## 2020-12-23 ENCOUNTER — Other Ambulatory Visit: Payer: Self-pay | Admitting: Family Medicine

## 2020-12-23 DIAGNOSIS — R200 Anesthesia of skin: Secondary | ICD-10-CM

## 2020-12-25 DIAGNOSIS — M545 Low back pain, unspecified: Secondary | ICD-10-CM | POA: Diagnosis not present

## 2020-12-25 DIAGNOSIS — R200 Anesthesia of skin: Secondary | ICD-10-CM | POA: Diagnosis not present

## 2020-12-30 ENCOUNTER — Ambulatory Visit
Admission: RE | Admit: 2020-12-30 | Discharge: 2020-12-30 | Disposition: A | Discharge status: Home / Self Care | Payer: Medicare PPO | Source: Ambulatory Visit | Attending: Family Medicine | Admitting: Family Medicine

## 2020-12-30 DIAGNOSIS — R200 Anesthesia of skin: Secondary | ICD-10-CM

## 2020-12-30 DIAGNOSIS — R202 Paresthesia of skin: Secondary | ICD-10-CM

## 2020-12-30 DIAGNOSIS — M545 Low back pain, unspecified: Secondary | ICD-10-CM | POA: Diagnosis not present

## 2020-12-30 DIAGNOSIS — M47816 Spondylosis without myelopathy or radiculopathy, lumbar region: Secondary | ICD-10-CM

## 2020-12-30 DIAGNOSIS — M4802 Spinal stenosis, cervical region: Secondary | ICD-10-CM | POA: Diagnosis not present

## 2020-12-30 DIAGNOSIS — M48061 Spinal stenosis, lumbar region without neurogenic claudication: Secondary | ICD-10-CM | POA: Diagnosis not present

## 2020-12-30 DIAGNOSIS — M4302 Spondylolysis, cervical region: Secondary | ICD-10-CM

## 2020-12-31 ENCOUNTER — Encounter: Payer: Self-pay | Admitting: Family Medicine

## 2020-12-31 ENCOUNTER — Telehealth: Payer: Self-pay | Admitting: Family Medicine

## 2020-12-31 DIAGNOSIS — M545 Low back pain, unspecified: Secondary | ICD-10-CM | POA: Diagnosis not present

## 2020-12-31 DIAGNOSIS — R200 Anesthesia of skin: Secondary | ICD-10-CM | POA: Diagnosis not present

## 2020-12-31 NOTE — Telephone Encounter (Signed)
MRI scan shows severe narrowing of the nerve openings at C3-4, C4-5 and C5-6.  There is moderate narrowing of the spinal canal at C5-6 and mild to moderate at 3 other levels.  In the lumbar spine there is moderate to severe narrowing of the nerve openings at L5-S1 and mild to moderate at L4-5.  All of the above findings can be associated with numbness and tingling in the extremities.  I recommend trying a course of physical therapy.  If still no improvement, then would consider obtaining nerve conduction studies and then possibly consulting with a surgeon versus trying steroid injections.

## 2021-01-12 ENCOUNTER — Ambulatory Visit
Admission: RE | Admit: 2021-01-12 | Discharge: 2021-01-12 | Disposition: A | Discharge status: Home / Self Care | Payer: Medicare PPO | Source: Ambulatory Visit

## 2021-01-12 ENCOUNTER — Other Ambulatory Visit: Payer: Self-pay

## 2021-01-12 DIAGNOSIS — Z1231 Encounter for screening mammogram for malignant neoplasm of breast: Secondary | ICD-10-CM

## 2021-01-12 DIAGNOSIS — M545 Low back pain, unspecified: Secondary | ICD-10-CM | POA: Diagnosis not present

## 2021-01-12 DIAGNOSIS — R200 Anesthesia of skin: Secondary | ICD-10-CM | POA: Diagnosis not present

## 2021-01-13 DIAGNOSIS — R944 Abnormal results of kidney function studies: Secondary | ICD-10-CM | POA: Diagnosis not present

## 2021-01-13 DIAGNOSIS — N1832 Chronic kidney disease, stage 3b: Secondary | ICD-10-CM | POA: Diagnosis not present

## 2021-01-13 DIAGNOSIS — E785 Hyperlipidemia, unspecified: Secondary | ICD-10-CM | POA: Diagnosis not present

## 2021-01-13 DIAGNOSIS — N281 Cyst of kidney, acquired: Secondary | ICD-10-CM | POA: Diagnosis not present

## 2021-01-13 DIAGNOSIS — N189 Chronic kidney disease, unspecified: Secondary | ICD-10-CM | POA: Diagnosis not present

## 2021-01-13 DIAGNOSIS — I129 Hypertensive chronic kidney disease with stage 1 through stage 4 chronic kidney disease, or unspecified chronic kidney disease: Secondary | ICD-10-CM | POA: Diagnosis not present

## 2021-01-15 NOTE — Progress Notes (Signed)
[facility] - Clinic Note  01/19/2021     CHIEF COMPLAINT Patient presents for Retina Follow Up   HISTORY OF PRESENT ILLNESS: Emily Reid is a 75 y.o. female who presents to the clinic today for:   HPI     Retina Follow Up   Patient presents with  CRVO/BRVO.  In right eye.  Duration of 9 weeks.  Since onset it is stable.  I, the attending physician,  performed the HPI with the patient and updated documentation appropriately.        Comments   9 week follow up BRVO OD-  Vision appears stable OU.       Last edited by Rennis Chris, MD on 01/19/2021  2:23 PM.      Referring physician: Lois Huxley MD [address] [place], [place] [address]  HISTORICAL INFORMATION:   Selected notes from the MEDICAL RECORD NUMBER Referred by Dr. Harless Litten for continuation of eye injections OD for BRVO with edema LEE: 08/05/2020 Ocular Hx- BRVO, CME, iritis OD PMH- HTN    CURRENT MEDICATIONS: No current outpatient medications on file. (Ophthalmic Drugs)   No current facility-administered medications for this visit. (Ophthalmic Drugs)   Current Outpatient Medications (Other)  Medication Sig   amLODipine (NORVASC) 5 MG tablet Take 5 mg by mouth daily.   buPROPion (WELLBUTRIN XL) 150 MG 24 hr tablet Take 150 mg by mouth daily.   Coenzyme Q10 (COQ10) 100 MG CAPS Take by mouth daily.   ergocalciferol (VITAMIN D2) 1.25 MG (50000 UT) capsule Take by mouth.   LOSARTAN POTASSIUM PO Take 1 tablet by mouth daily.   mirtazapine (REMERON) 15 MG tablet Take 15 mg by mouth at bedtime.   Multiple Vitamin (MULTIVITAMIN) tablet Take 1 tablet by mouth daily.   Omega-3 Fatty Acids (FISH OIL PO) Take 1 capsule by mouth 5 (five) times daily.   OVER THE COUNTER MEDICATION    OVER THE COUNTER MEDICATION    Phosphatidylserine 100 MG CAPS Take by mouth.   rosuvastatin (CRESTOR) 20 MG tablet Take 20 mg by mouth daily.   sertraline (ZOLOFT) 50 MG tablet Take 50 mg by mouth  daily.   Turmeric (QC TUMERIC COMPLEX PO) Take 553 mg by mouth daily.   vitamin C (ASCORBIC ACID) 500 MG tablet Take 500 mg by mouth daily.   Cholecalciferol (VITAMIN D PO) Take 1 capsule by mouth daily.   Multiple Vitamin (MULTIVITAMIN WITH MINERALS) TABS Take 1 tablet by mouth daily.   simvastatin (ZOCOR) 80 MG tablet Take 80 mg by mouth at bedtime. (Patient not taking: Reported on 01/19/2021)   No current facility-administered medications for this visit. (Other)      REVIEW OF SYSTEMS: ROS   Positive for: Genitourinary, Eyes Negative for: Constitutional, Gastrointestinal, Neurological, Skin, Musculoskeletal, HENT, Endocrine, Cardiovascular, Respiratory, Psychiatric, Allergic/Imm, Heme/Lymph Last edited by Joni Reining, COA on 01/19/2021  2:00 PM.        ALLERGIES No Known Allergies  PAST MEDICAL HISTORY Past Medical History:  Diagnosis Date   Cataract    Mixed OU   Hyperlipidemia    Hypertension    Hypertensive retinopathy    OU   Urine incontinence    Past Surgical History:  Procedure Laterality Date   FACIAL COSMETIC SURGERY     TONSILLECTOMY  1957    FAMILY HISTORY Family History  Problem Relation Age of Onset   Heart disease Mother    Hyperlipidemia Mother    Hypertension Mother  Stroke Mother     SOCIAL HISTORY Social History   Tobacco Use   Smoking status: Never   Smokeless tobacco: Never  Substance Use Topics   Alcohol use: Yes    Alcohol/week: 7.0 standard drinks    Types: 7 Glasses of wine per week   Drug use: No         OPHTHALMIC EXAM:  Base Eye Exam     Visual Acuity (Snellen - Linear)       Right Left   Dist cc 20/20 20/20         Tonometry (Tonopen, 2:06 PM)       Right Left   Pressure 15 16         Pupils       Dark Light Shape React APD   Right 3 2 Round Brisk None   Left 3 2 Round Brisk None         Visual Fields (Counting fingers)       Left Right    Full Full         Extraocular Movement        Right Left    Full Full         Neuro/Psych     Oriented x3: Yes   Mood/Affect: Normal         Dilation     Both eyes: 1.0% Mydriacyl, 2.5% Phenylephrine @ 2:06 PM           Slit Lamp and Fundus Exam     Slit Lamp Exam       Right Left   Lids/Lashes Dermatochalasis - upper lid Dermatochalasis - upper lid   Conjunctiva/Sclera White and quiet White and quiet   Cornea 2+ Punctate epithelial erosions, early band K nasal and temporal 2+ Punctate epithelial erosions, early band K nasal and temporal   Anterior Chamber Deep and quiet Deep and quiet   Iris Round and dilated Round and dilated   Lens 2+ Nuclear sclerosis, 1-2+ Cortical cataract, focal Posterior subcapsular cataract at 0700 2+ Nuclear sclerosis, 2+ Cortical cataract, focal Posterior subcapsular cataract at 0700   Vitreous Vitreous syneresis, silicone oil micro bubbles Vitreous syneresis, silicone oil micro bubbles, Posterior vitreous detachment         Fundus Exam       Right Left   Disc Pink and Sharp, mild PPA Pink and Sharp   C/D Ratio 0.3 0.2   Macula Flat, Blunted foveal reflex, Drusen, RPE mottling, No heme or edema Flat, Blunted foveal reflex, Drusen, RPE mottling, No heme or edema   Vessels attenuated, mild tortuousity attenuated, mild tortuousity   Periphery Attached, no heme    Attached, mild mid zonal drusen, no heme              Refraction     Wearing Rx       Sphere Cylinder Axis Add   Right -0.50 +0.25 031 +2.50   Left +1.00 Sphere  +2.50            IMAGING AND PROCEDURES  Imaging and Procedures for 01/19/2021  OCT, Retina - OU - Both Eyes       Right Eye Quality was good. Central Foveal Thickness: 286. Progression has been stable. Findings include normal foveal contour, no IRF, no SRF (Hx of BRVO, no CME today, Irregular lamination inferior macula--mild, stable).   Left Eye Quality was good. Central Foveal Thickness: 297. Progression has been stable. Findings  include normal foveal contour, no IRF,  no SRF, retinal drusen .   Notes *Images captured and stored on drive  Diagnosis / Impression:  OD: Hx of BRVO, no CME today, Irregular lamination inferior macula OS: NFP, no IRF/SRF, +drusen  Clinical management:  See below  Abbreviations: NFP - Normal foveal profile. CME - cystoid macular edema. PED - pigment epithelial detachment. IRF - intraretinal fluid. SRF - subretinal fluid. EZ - ellipsoid zone. ERM - epiretinal membrane. ORA - outer retinal atrophy. ORT - outer retinal tubulation. SRHM - subretinal hyper-reflective material. IRHM - intraretinal hyper-reflective material      Intravitreal Injection, Pharmacologic Agent - OD - Right Eye       Time Out 01/19/2021. 2:25 PM. Confirmed correct patient, procedure, site, and patient consented.   Anesthesia Topical anesthesia was used. Anesthetic medications included Lidocaine 2%, Proparacaine 0.5%.   Procedure Preparation included 5% betadine to ocular surface, eyelid speculum. A (32g) needle was used.   Injection: 1.25 mg Bevacizumab 1.25mg /0.79ml   Route: Intravitreal, Site: Right Eye   NDC: [id number], Lot: [id number], Expiration date: 02/23/2021, Waste: 0.05 mL   Post-op Post injection exam found visual acuity of at least counting fingers. The patient tolerated the procedure well. There were no complications. The patient received written and verbal post procedure care education.            ASSESSMENT/PLAN:    ICD-10-CM   1. Branch retinal vein occlusion of right eye with macular edema  H34.8310 Intravitreal Injection, Pharmacologic Agent - OD - Right Eye    Bevacizumab (AVASTIN) SOLN 1.25 mg    2. Retinal edema  H35.81 OCT, Retina - OU - Both Eyes    3. Essential hypertension  I10     4. Hypertensive retinopathy of both eyes  H35.033     5. Combined forms of age-related cataract of both eyes  H25.813      1,2. BRVO w/ CME OD             - s/p IVA OD #1 (02.04.22), #2  (05.23.22)  - hx of injections with Dr. Harless Litten in [place], [place] -- pt was receiving Lucentis q7-8 wks; pt reports history Avastin, but was switched to Lucentis at some point - last injection IVL OD was 02.04.22 (7.5 wks) - pt reports hx of worsening vision and OCT around 10 weeks - BCVA OD is 20/20 - OCT again shows no CME today, Irregular lamination inferior macula at 9 wks - recommend IVA OD #3 today, 07.25.22 -- maintenance therapy w ext to 10 wks - RBA of procedure discussed, questions answered - informed consent obtained and signed - see procedure note - F/U 10 weeks -- DFE/OCT/possible injection  3,4. Hypertensive retinopathy OU - discussed importance of tight BP control - monitor  5. Mixed Cataract OU - The symptoms of cataract, surgical options, and treatments and risks were discussed with patient. - discussed diagnosis and progression - not yet visually significant - monitor for now  Ophthalmic Meds Ordered this visit:  Meds ordered this encounter  Medications   Bevacizumab (AVASTIN) SOLN 1.25 mg       Return in about 10 weeks (around 03/30/2021) for 10 weeks BRVO OD.  There are no Patient Instructions on file for this visit.   This document serves as a record of services personally performed by Karie Chimera, MD, PhD. It was created on their behalf by Annalee Genta, COMT. The creation of this record is the provider's dictation and/or activities during the visit.  Electronically signed by: Nelma Rothman  Benna Dunks, COMT 01/19/21 10:29 PM  Karie Chimera, M.D., Ph.D. Diseases & Surgery of the Retina and Vitreous [facility]  I have reviewed the above documentation for accuracy and completeness, and I agree with the above. Karie Chimera, M.D., Ph.D. 01/19/21 10:29 PM   Abbreviations: M myopia (nearsighted); A astigmatism; H hyperopia (farsighted); P presbyopia; Mrx spectacle prescription;  CTL contact lenses; OD right eye; OS left eye; OU both eyes   XT exotropia; ET esotropia; PEK punctate epithelial keratitis; PEE punctate epithelial erosions; DES dry eye syndrome; MGD meibomian gland dysfunction; ATs artificial tears; PFAT's preservative free artificial tears; NSC nuclear sclerotic cataract; PSC posterior subcapsular cataract; ERM epi-retinal membrane; PVD posterior vitreous detachment; RD retinal detachment; DM diabetes mellitus; DR diabetic retinopathy; NPDR non-proliferative diabetic retinopathy; PDR proliferative diabetic retinopathy; CSME clinically significant macular edema; DME diabetic macular edema; dbh dot blot hemorrhages; CWS cotton wool spot; POAG primary open angle glaucoma; C/D cup-to-disc ratio; HVF humphrey visual field; GVF goldmann visual field; OCT optical coherence tomography; IOP intraocular pressure; BRVO Branch retinal vein occlusion; CRVO central retinal vein occlusion; CRAO central retinal artery occlusion; BRAO branch retinal artery occlusion; RT retinal tear; SB scleral buckle; PPV pars plana vitrectomy; VH Vitreous hemorrhage; PRP panretinal laser photocoagulation; IVK intravitreal kenalog; VMT vitreomacular traction; MH Macular hole;  NVD neovascularization of the disc; NVE neovascularization elsewhere; AREDS age related eye disease study; ARMD age related macular degeneration; POAG primary open angle glaucoma; EBMD epithelial/anterior basement membrane dystrophy; ACIOL anterior chamber intraocular lens; IOL intraocular lens; PCIOL posterior chamber intraocular lens; Phaco/IOL phacoemulsification with intraocular lens placement; PRK photorefractive keratectomy; LASIK laser assisted in situ keratomileusis; HTN hypertension; DM diabetes mellitus; COPD chronic obstructive pulmonary disease

## 2021-01-19 ENCOUNTER — Ambulatory Visit (INDEPENDENT_AMBULATORY_CARE_PROVIDER_SITE_OTHER): Payer: Medicare PPO | Admitting: Ophthalmology

## 2021-01-19 ENCOUNTER — Other Ambulatory Visit: Payer: Self-pay

## 2021-01-19 ENCOUNTER — Encounter (INDEPENDENT_AMBULATORY_CARE_PROVIDER_SITE_OTHER): Payer: Self-pay | Admitting: Ophthalmology

## 2021-01-19 DIAGNOSIS — I10 Essential (primary) hypertension: Secondary | ICD-10-CM

## 2021-01-19 DIAGNOSIS — H35033 Hypertensive retinopathy, bilateral: Secondary | ICD-10-CM | POA: Diagnosis not present

## 2021-01-19 DIAGNOSIS — H34831 Tributary (branch) retinal vein occlusion, right eye, with macular edema: Secondary | ICD-10-CM

## 2021-01-19 DIAGNOSIS — H3581 Retinal edema: Secondary | ICD-10-CM | POA: Diagnosis not present

## 2021-01-19 DIAGNOSIS — H25813 Combined forms of age-related cataract, bilateral: Secondary | ICD-10-CM | POA: Diagnosis not present

## 2021-01-19 MED ORDER — BEVACIZUMAB CHEMO INJECTION 1.25MG/0.05ML SYRINGE FOR KALEIDOSCOPE
1.2500 mg | INTRAVITREAL | Status: AC | PRN
  Administered 2021-01-19: 1.25 mg via INTRAVITREAL

## 2021-01-20 DIAGNOSIS — R200 Anesthesia of skin: Secondary | ICD-10-CM | POA: Diagnosis not present

## 2021-01-20 DIAGNOSIS — M545 Low back pain, unspecified: Secondary | ICD-10-CM | POA: Diagnosis not present

## 2021-01-28 ENCOUNTER — Other Ambulatory Visit: Payer: Self-pay | Admitting: Internal Medicine

## 2021-01-28 ENCOUNTER — Encounter: Payer: Self-pay | Admitting: Internal Medicine

## 2021-01-28 DIAGNOSIS — R200 Anesthesia of skin: Secondary | ICD-10-CM | POA: Diagnosis not present

## 2021-01-28 DIAGNOSIS — I10 Essential (primary) hypertension: Secondary | ICD-10-CM

## 2021-01-28 DIAGNOSIS — M545 Low back pain, unspecified: Secondary | ICD-10-CM | POA: Diagnosis not present

## 2021-01-28 MED ORDER — AMLODIPINE BESYLATE 10 MG PO TABS: 10.0000 mg | ORAL_TABLET | Freq: Every day | ORAL | 0 refills | Status: DC

## 2021-02-11 DIAGNOSIS — R200 Anesthesia of skin: Secondary | ICD-10-CM | POA: Diagnosis not present

## 2021-02-11 DIAGNOSIS — M545 Low back pain, unspecified: Secondary | ICD-10-CM | POA: Diagnosis not present

## 2021-02-18 DIAGNOSIS — M545 Low back pain, unspecified: Secondary | ICD-10-CM | POA: Diagnosis not present

## 2021-02-18 DIAGNOSIS — R200 Anesthesia of skin: Secondary | ICD-10-CM | POA: Diagnosis not present

## 2021-02-25 DIAGNOSIS — M545 Low back pain, unspecified: Secondary | ICD-10-CM | POA: Diagnosis not present

## 2021-02-25 DIAGNOSIS — R200 Anesthesia of skin: Secondary | ICD-10-CM | POA: Diagnosis not present

## 2021-02-26 DIAGNOSIS — H3530 Unspecified macular degeneration: Secondary | ICD-10-CM | POA: Diagnosis not present

## 2021-03-03 DIAGNOSIS — R200 Anesthesia of skin: Secondary | ICD-10-CM | POA: Diagnosis not present

## 2021-03-03 DIAGNOSIS — M545 Low back pain, unspecified: Secondary | ICD-10-CM | POA: Diagnosis not present

## 2021-03-16 DIAGNOSIS — M545 Low back pain, unspecified: Secondary | ICD-10-CM | POA: Diagnosis not present

## 2021-03-16 DIAGNOSIS — R200 Anesthesia of skin: Secondary | ICD-10-CM | POA: Diagnosis not present

## 2021-03-31 NOTE — Progress Notes (Signed)
[facility] - Clinic Note  04/01/2021     CHIEF COMPLAINT Patient presents for Retina Follow Up  HISTORY OF PRESENT ILLNESS: Emily Reid is a 75 y.o. female who presents to the clinic today for:  HPI     Retina Follow Up   Patient presents with  CRVO/BRVO.  In right eye.  This started weeks ago.  Severity is moderate.  Duration of weeks.  Since onset it is stable.  I, the attending physician,  performed the HPI with the patient and updated documentation appropriately.        Comments   Pt states vision is the same OU.  Pt denies eye pain or discomfort.  Pt has persistent floaters OU--says her brain has learned to ignore them.  Pt denies FOL.      Last edited by Rennis Chris, MD on 04/01/2021  5:33 PM.      Referring physician: Lois Huxley MD [address] [place], [place] [address]  HISTORICAL INFORMATION:   Selected notes from the MEDICAL RECORD NUMBER Referred by Dr. Harless Litten for continuation of eye injections OD for BRVO with edema LEE: 08/05/2020 Ocular Hx- BRVO, CME, iritis OD PMH- HTN   CURRENT MEDICATIONS: No current outpatient medications on file. (Ophthalmic Drugs)   No current facility-administered medications for this visit. (Ophthalmic Drugs)   Current Outpatient Medications (Other)  Medication Sig   amLODipine (NORVASC) 10 MG tablet Take 1 tablet (10 mg total) by mouth daily.   buPROPion (WELLBUTRIN XL) 150 MG 24 hr tablet Take 150 mg by mouth daily.   Cholecalciferol (VITAMIN D PO) Take 1 capsule by mouth daily.   Coenzyme Q10 (COQ10) 100 MG CAPS Take by mouth daily.   ergocalciferol (VITAMIN D2) 1.25 MG (50000 UT) capsule Take by mouth.   LOSARTAN POTASSIUM PO Take 1 tablet by mouth daily.   mirtazapine (REMERON) 15 MG tablet Take 15 mg by mouth at bedtime.   Multiple Vitamin (MULTIVITAMIN WITH MINERALS) TABS Take 1 tablet by mouth daily.   Multiple Vitamin (MULTIVITAMIN) tablet Take 1 tablet by mouth daily.    Omega-3 Fatty Acids (FISH OIL PO) Take 1 capsule by mouth 5 (five) times daily.   OVER THE COUNTER MEDICATION    OVER THE COUNTER MEDICATION    Phosphatidylserine 100 MG CAPS Take by mouth.   rosuvastatin (CRESTOR) 20 MG tablet Take 20 mg by mouth daily.   sertraline (ZOLOFT) 50 MG tablet Take 50 mg by mouth daily.   simvastatin (ZOCOR) 80 MG tablet Take 80 mg by mouth at bedtime. (Patient not taking: Reported on 01/19/2021)   Turmeric (QC TUMERIC COMPLEX PO) Take 553 mg by mouth daily.   vitamin C (ASCORBIC ACID) 500 MG tablet Take 500 mg by mouth daily.   No current facility-administered medications for this visit. (Other)   REVIEW OF SYSTEMS: ROS   Positive for: Genitourinary, Eyes Negative for: Constitutional, Gastrointestinal, Neurological, Skin, Musculoskeletal, HENT, Endocrine, Cardiovascular, Respiratory, Psychiatric, Allergic/Imm, Heme/Lymph Last edited by Corrinne Eagle on 04/01/2021  1:39 PM.     ALLERGIES No Known Allergies  PAST MEDICAL HISTORY Past Medical History:  Diagnosis Date   Cataract    Mixed OU   Hyperlipidemia    Hypertension    Hypertensive retinopathy    OU   Urine incontinence    Past Surgical History:  Procedure Laterality Date   FACIAL COSMETIC SURGERY     TONSILLECTOMY  1957    FAMILY HISTORY Family History  Problem Relation Age of Onset   Heart disease Mother    Hyperlipidemia Mother    Hypertension Mother    Stroke Mother     SOCIAL HISTORY Social History   Tobacco Use   Smoking status: Never   Smokeless tobacco: Never  Substance Use Topics   Alcohol use: Yes    Alcohol/week: 7.0 standard drinks    Types: 7 Glasses of wine per week   Drug use: No       OPHTHALMIC EXAM:  Base Eye Exam     Visual Acuity (Snellen - Linear)       Right Left   Dist cc 20/20 20/20 -1         Tonometry (Tonopen, 1:41 PM)       Right Left   Pressure 20 18         Pupils       Dark Light Shape React APD   Right 3 2 Round  Brisk 0   Left 3 2 Round Brisk 0         Visual Fields       Left Right    Full Full         Extraocular Movement       Right Left    Full Full         Neuro/Psych     Oriented x3: Yes   Mood/Affect: Normal         Dilation     Both eyes: 1.0% Mydriacyl, 2.5% Phenylephrine @ 1:41 PM           Slit Lamp and Fundus Exam     Slit Lamp Exam       Right Left   Lids/Lashes Dermatochalasis - upper lid Dermatochalasis - upper lid   Conjunctiva/Sclera White and quiet White and quiet   Cornea 1+ Punctate epithelial erosions, early band K nasal and temporal 1+ Punctate epithelial erosions, early band K nasal and temporal   Anterior Chamber Deep and quiet Deep and quiet   Iris Round and dilated Round and dilated   Lens 2+ Nuclear sclerosis, 1-2+ Cortical cataract, focal Posterior subcapsular cataract at 0700 2+ Nuclear sclerosis, 2+ Cortical cataract, focal Posterior subcapsular cataract at 0700   Vitreous Vitreous syneresis, silicone oil micro bubbles Vitreous syneresis, silicone oil micro bubbles, Posterior vitreous detachment         Fundus Exam       Right Left   Disc Pink and Sharp, mild PPA Pink and Sharp   C/D Ratio 0.3 0.2   Macula Flat, Blunted foveal reflex, Drusen, RPE mottling, No heme or edema Flat, Blunted foveal reflex, Drusen, RPE mottling, No heme or edema   Vessels attenuated, mild tortuousity attenuated, mild tortuousity   Periphery Attached, no heme    Attached, mild mid zonal drusen, no heme              Refraction     Wearing Rx       Sphere Cylinder Axis Add   Right -0.50 +0.25 031 +2.50   Left +1.00 Sphere  +2.50           IMAGING AND PROCEDURES  Imaging and Procedures for 04/01/2021  OCT, Retina - OU - Both Eyes       Right Eye Quality was good. Central Foveal Thickness: 292. Progression has been stable. Findings include normal foveal contour, no IRF, no SRF (Hx of BRVO, trace cystic changes, Irregular lamination  inferior macula--mild, stable).   Left  Eye Quality was good. Central Foveal Thickness: 295. Progression has been stable. Findings include normal foveal contour, no IRF, no SRF, retinal drusen .   Notes *Images captured and stored on drive  Diagnosis / Impression:  OD: Hx of BRVO, trace cystic changes, Irregular lamination inferior macula--mild, stable OS: NFP, no IRF/SRF, +drusen  Clinical management:  See below  Abbreviations: NFP - Normal foveal profile. CME - cystoid macular edema. PED - pigment epithelial detachment. IRF - intraretinal fluid. SRF - subretinal fluid. EZ - ellipsoid zone. ERM - epiretinal membrane. ORA - outer retinal atrophy. ORT - outer retinal tubulation. SRHM - subretinal hyper-reflective material. IRHM - intraretinal hyper-reflective material      Intravitreal Injection, Pharmacologic Agent - OD - Right Eye       Time Out 04/01/2021. 2:03 PM. Confirmed correct patient, procedure, site, and patient consented.   Anesthesia Topical anesthesia was used. Anesthetic medications included Lidocaine 2%, Proparacaine 0.5%.   Procedure Preparation included 5% betadine to ocular surface, eyelid speculum. A supplied needle was used.   Injection: 1.25 mg Bevacizumab 1.25mg /0.58ml   Route: Intravitreal, Site: Right Eye   NDC: [id number], Lot: [id number] , Expiration date: 05/13/2021, Waste: 0 mL   Post-op Post injection exam found visual acuity of at least counting fingers. The patient tolerated the procedure well. There were no complications. The patient received written and verbal post procedure care education.            ASSESSMENT/PLAN:    ICD-10-CM   1. Branch retinal vein occlusion of right eye with macular edema  H34.8310 Intravitreal Injection, Pharmacologic Agent - OD - Right Eye    Bevacizumab (AVASTIN) SOLN 1.25 mg    2. Retinal edema  H35.81 OCT, Retina - OU - Both Eyes    3. Essential hypertension  I10     4. Hypertensive retinopathy of  both eyes  H35.033     5. Combined forms of age-related cataract of both eyes  H25.813     1,2. BRVO w/ CME OD             - s/p IVA OD #1 (02.04.22), #2 (05.23.22), #3 (7.25.22)  - hx of injections with Dr. 04-30-1995 in [place], [place] -- pt was receiving Lucentis q7-8 wks; pt reports history Avastin, but was switched to Lucentis at some point - last injection IVL OD was 02.04.22 (7.5 wks) - pt reports hx of worsening vision and OCT around 10 weeks - BCVA OD is 20/20 - OCT shows trace cystic changes, Irregular lamination inferior macula at 10 wks - recommend IVA OD #4 today, 10.05.22 -- maintenance therapy w/ f/u in 10 wks - RBA of procedure discussed, questions answered - informed consent obtained and signed - see procedure note - F/U 10 weeks -- DFE/OCT/possible injection  3,4. Hypertensive retinopathy OU - discussed importance of tight BP control - monitor  5. Mixed Cataract OU - The symptoms of cataract, surgical options, and treatments and risks were discussed with patient. - discussed diagnosis and progression - not yet visually significant - monitor for now  Ophthalmic Meds Ordered this visit:  Meds ordered this encounter  Medications   Bevacizumab (AVASTIN) SOLN 1.25 mg     Return in about 10 weeks (around 06/10/2021) for f/u BRVO OD, DFE, OCT.  There are no Patient Instructions on file for this visit.  This document serves as a record of services personally performed by 06/12/2021, MD, PhD. It was created on their behalf by Karie Chimera, COT an  ophthalmic technician. The creation of this record is the provider's dictation and/or activities during the visit.    Electronically signed by: Cristopher Estimable, COT 10.4.22 @ 5:34 PM   This document serves as a record of services personally performed by Karie Chimera, MD, PhD. It was created on their behalf by Glee Arvin. Manson Passey, OA an ophthalmic technician. The creation of this record is the provider's dictation and/or  activities during the visit.    Electronically signed by: Glee Arvin. Manson Passey, [place] 10.05.2022 5:34 PM   Karie Chimera, M.D., Ph.D. Diseases & Surgery of the Retina and Vitreous [facility]  I have reviewed the above documentation for accuracy and completeness, and I agree with the above. Karie Chimera, M.D., Ph.D. 04/01/21 5:35 PM  Abbreviations: M myopia (nearsighted); A astigmatism; H hyperopia (farsighted); P presbyopia; Mrx spectacle prescription;  CTL contact lenses; OD right eye; OS left eye; OU both eyes  XT exotropia; ET esotropia; PEK punctate epithelial keratitis; PEE punctate epithelial erosions; DES dry eye syndrome; MGD meibomian gland dysfunction; ATs artificial tears; PFAT's preservative free artificial tears; NSC nuclear sclerotic cataract; PSC posterior subcapsular cataract; ERM epi-retinal membrane; PVD posterior vitreous detachment; RD retinal detachment; DM diabetes mellitus; DR diabetic retinopathy; NPDR non-proliferative diabetic retinopathy; PDR proliferative diabetic retinopathy; CSME clinically significant macular edema; DME diabetic macular edema; dbh dot blot hemorrhages; CWS cotton wool spot; POAG primary open angle glaucoma; C/D cup-to-disc ratio; HVF humphrey visual field; GVF goldmann visual field; OCT optical coherence tomography; IOP intraocular pressure; BRVO Branch retinal vein occlusion; CRVO central retinal vein occlusion; CRAO central retinal artery occlusion; BRAO branch retinal artery occlusion; RT retinal tear; SB scleral buckle; PPV pars plana vitrectomy; VH Vitreous hemorrhage; PRP panretinal laser photocoagulation; IVK intravitreal kenalog; VMT vitreomacular traction; MH Macular hole;  NVD neovascularization of the disc; NVE neovascularization elsewhere; AREDS age related eye disease study; ARMD age related macular degeneration; POAG primary open angle glaucoma; EBMD epithelial/anterior basement membrane dystrophy; ACIOL anterior chamber  intraocular lens; IOL intraocular lens; PCIOL posterior chamber intraocular lens; Phaco/IOL phacoemulsification with intraocular lens placement; PRK photorefractive keratectomy; LASIK laser assisted in situ keratomileusis; HTN hypertension; DM diabetes mellitus; COPD chronic obstructive pulmonary disease

## 2021-04-01 ENCOUNTER — Encounter (INDEPENDENT_AMBULATORY_CARE_PROVIDER_SITE_OTHER): Payer: Self-pay | Admitting: Ophthalmology

## 2021-04-01 ENCOUNTER — Other Ambulatory Visit: Payer: Self-pay

## 2021-04-01 ENCOUNTER — Ambulatory Visit (INDEPENDENT_AMBULATORY_CARE_PROVIDER_SITE_OTHER): Payer: Medicare PPO | Admitting: Ophthalmology

## 2021-04-01 DIAGNOSIS — H25813 Combined forms of age-related cataract, bilateral: Secondary | ICD-10-CM

## 2021-04-01 DIAGNOSIS — H34831 Tributary (branch) retinal vein occlusion, right eye, with macular edema: Secondary | ICD-10-CM | POA: Diagnosis not present

## 2021-04-01 DIAGNOSIS — H35033 Hypertensive retinopathy, bilateral: Secondary | ICD-10-CM

## 2021-04-01 DIAGNOSIS — I10 Essential (primary) hypertension: Secondary | ICD-10-CM

## 2021-04-01 DIAGNOSIS — H3581 Retinal edema: Secondary | ICD-10-CM

## 2021-04-01 MED ORDER — BEVACIZUMAB CHEMO INJECTION 1.25MG/0.05ML SYRINGE FOR KALEIDOSCOPE
1.2500 mg | INTRAVITREAL | Status: AC | PRN
  Administered 2021-04-01: 1.25 mg via INTRAVITREAL

## 2021-04-06 ENCOUNTER — Other Ambulatory Visit: Payer: Self-pay | Admitting: Internal Medicine

## 2021-04-06 DIAGNOSIS — M545 Low back pain, unspecified: Secondary | ICD-10-CM | POA: Diagnosis not present

## 2021-04-06 DIAGNOSIS — R200 Anesthesia of skin: Secondary | ICD-10-CM | POA: Diagnosis not present

## 2021-04-06 DIAGNOSIS — I10 Essential (primary) hypertension: Secondary | ICD-10-CM

## 2021-04-06 MED ORDER — AMLODIPINE BESYLATE 10 MG PO TABS: 10.0000 mg | ORAL_TABLET | Freq: Every day | ORAL | 0 refills | Status: DC

## 2021-04-16 DIAGNOSIS — R200 Anesthesia of skin: Secondary | ICD-10-CM | POA: Diagnosis not present

## 2021-04-16 DIAGNOSIS — M545 Low back pain, unspecified: Secondary | ICD-10-CM | POA: Diagnosis not present

## 2021-04-30 ENCOUNTER — Other Ambulatory Visit: Payer: Self-pay

## 2021-04-30 ENCOUNTER — Encounter: Payer: Self-pay | Admitting: Internal Medicine

## 2021-04-30 ENCOUNTER — Ambulatory Visit: Payer: Medicare PPO | Admitting: Internal Medicine

## 2021-04-30 VITALS — BP 130/76 | HR 76 | Temp 98.7°F | Resp 16 | Ht 66.0 in | Wt 144.0 lb

## 2021-04-30 DIAGNOSIS — E785 Hyperlipidemia, unspecified: Secondary | ICD-10-CM | POA: Diagnosis not present

## 2021-04-30 DIAGNOSIS — F325 Major depressive disorder, single episode, in full remission: Secondary | ICD-10-CM

## 2021-04-30 DIAGNOSIS — I10 Essential (primary) hypertension: Secondary | ICD-10-CM

## 2021-04-30 MED ORDER — AMLODIPINE BESYLATE 10 MG PO TABS: 10.0000 mg | ORAL_TABLET | Freq: Every day | ORAL | 1 refills | Status: AC

## 2021-04-30 MED ORDER — ROSUVASTATIN CALCIUM 20 MG PO TABS: 20.0000 mg | ORAL_TABLET | Freq: Every day | ORAL | 1 refills | Status: DC

## 2021-04-30 MED ORDER — MIRTAZAPINE 15 MG PO TABS: 15.0000 mg | ORAL_TABLET | Freq: Every day | ORAL | 1 refills | Status: DC

## 2021-04-30 MED ORDER — SERTRALINE HCL 50 MG PO TABS
50.0000 mg | ORAL_TABLET | Freq: Every day | ORAL | 1 refills | Status: DC
Start: 2021-04-30 — End: 2021-10-17

## 2021-04-30 NOTE — Progress Notes (Signed)
Subjective:  Patient ID: Emily Reid, female    DOB: [birth year]  Age: 75 y.o. MRN: [id number]  CC: Hypertension and Depression  This visit occurred during the SARS-CoV-2 public health emergency.  Safety protocols were in place, including screening questions prior to the visit, additional usage of staff PPE, and extensive cleaning of exam room while observing appropriate contact time as indicated for disinfecting solutions.    HPI Emily Reid presents for f/up -  She tells me her blood pressure is well controlled.  She is active and denies chest pain, shortness of breath, palpitations, edema, or fatigue.  Outpatient Medications Prior to Visit  Medication Sig Dispense Refill   buPROPion (WELLBUTRIN XL) 150 MG 24 hr tablet Take 150 mg by mouth daily.     Coenzyme Q10 (COQ10) 100 MG CAPS Take by mouth daily.     ergocalciferol (VITAMIN D2) 1.25 MG (50000 UT) capsule Take by mouth.     LOSARTAN POTASSIUM PO Take 1 tablet by mouth daily.     Multiple Vitamin (MULTIVITAMIN) tablet Take 1 tablet by mouth daily.     Omega-3 Fatty Acids (FISH OIL PO) Take 1 capsule by mouth 5 (five) times daily.     OVER THE COUNTER MEDICATION      OVER THE COUNTER MEDICATION      Phosphatidylserine 100 MG CAPS Take by mouth.     Turmeric (QC TUMERIC COMPLEX PO) Take 553 mg by mouth daily.     vitamin C (ASCORBIC ACID) 500 MG tablet Take 500 mg by mouth daily.     amLODipine (NORVASC) 10 MG tablet Take 1 tablet (10 mg total) by mouth daily. 90 tablet 0   mirtazapine (REMERON) 15 MG tablet Take 15 mg by mouth at bedtime.     rosuvastatin (CRESTOR) 20 MG tablet Take 20 mg by mouth daily.     sertraline (ZOLOFT) 50 MG tablet Take 50 mg by mouth daily.     No facility-administered medications prior to visit.    ROS Review of Systems  Constitutional:  Negative for chills, diaphoresis, fatigue, fever and unexpected weight change.  HENT: Negative.    Eyes: Negative.   Respiratory:  Negative for  cough, chest tightness, shortness of breath and wheezing.   Cardiovascular:  Negative for chest pain, palpitations and leg swelling.  Gastrointestinal:  Negative for abdominal pain, constipation, diarrhea, nausea and vomiting.  Endocrine: Negative.   Genitourinary: Negative.  Negative for difficulty urinating.  Musculoskeletal: Negative.  Negative for arthralgias and joint swelling.  Skin: Negative.   Neurological:  Positive for numbness. Negative for dizziness, weakness, light-headedness and headaches.  Hematological:  Negative for adenopathy. Does not bruise/bleed easily.  Psychiatric/Behavioral: Negative.     Objective:  BP 130/76 (BP Location: Right Arm, Patient Position: Sitting, Cuff Size: Large)   Pulse 76   Temp 98.7 F (37.1 C) (Oral)   Resp 16   Ht 5\' 6"  (1.676 m)   Wt 144 lb (65.3 kg)   SpO2 97%   BMI 23.24 kg/m   BP Readings from Last 3 Encounters:  04/30/21 130/76  11/27/20 120/60  10/28/20 (!) 144/86    Wt Readings from Last 3 Encounters:  04/30/21 144 lb (65.3 kg)  11/27/20 141 lb 6.4 oz (64.1 kg)  10/28/20 137 lb (62.1 kg)    Physical Exam Vitals reviewed.  HENT:     Nose: Nose normal.     Mouth/Throat:     Mouth: Mucous membranes are moist.  Eyes:  Conjunctiva/sclera: Conjunctivae normal.  Cardiovascular:     Rate and Rhythm: Normal rate and regular rhythm.     Heart sounds: No murmur heard. Pulmonary:     Effort: Pulmonary effort is normal.     Breath sounds: No stridor. No wheezing, rhonchi or rales.  Abdominal:     General: Abdomen is flat.     Palpations: There is no mass.     Tenderness: There is no abdominal tenderness. There is no guarding.     Hernia: No hernia is present.  Musculoskeletal:        General: Normal range of motion.     Cervical back: Neck supple.     Right lower leg: No edema.     Left lower leg: No edema.  Lymphadenopathy:     Cervical: No cervical adenopathy.  Skin:    General: Skin is warm and dry.   Neurological:     General: No focal deficit present.  Psychiatric:        Mood and Affect: Mood normal.        Behavior: Behavior normal.    Lab Results  Component Value Date   WBC 4.0 11/04/2020   HGB 13.2 11/04/2020   HCT 38.6 11/04/2020   PLT 272.0 11/04/2020   GLUCOSE 100 (H) 11/04/2020   CHOL 215 (H) 11/04/2020   TRIG 67.0 11/04/2020   HDL 100.00 11/04/2020   LDLCALC 102 (H) 11/04/2020   ALT 16 11/04/2020   AST 23 11/04/2020   NA 137 11/04/2020   K 4.4 11/04/2020   CL 102 11/04/2020   CREATININE 1.12 11/04/2020   BUN 20 11/04/2020   CO2 29 11/04/2020   TSH 2.60 11/04/2020    MM 3D SCREEN BREAST BILATERAL  Result Date: 01/19/2021 CLINICAL DATA:  Screening. EXAM: DIGITAL SCREENING BILATERAL MAMMOGRAM WITH TOMOSYNTHESIS AND CAD TECHNIQUE: Bilateral screening digital craniocaudal and mediolateral oblique mammograms were obtained. Bilateral screening digital breast tomosynthesis was performed. The images were evaluated with computer-aided detection. COMPARISON:  Previous exam(s). ACR Breast Density Category b: There are scattered areas of fibroglandular density. FINDINGS: There are no findings suspicious for malignancy. IMPRESSION: No mammographic evidence of malignancy. A result letter of this screening mammogram will be mailed directly to the patient. RECOMMENDATION: Screening mammogram in one year. (Code:[id number]-B-01Y) BI-RADS CATEGORY  1: Negative. Is please Insert Correct Screening Template Electronically Signed   By: Ted Mcalpine M.D.   On: 01/19/2021 09:03    Assessment & Plan:   Emily Reid was seen today for hypertension and depression.  Diagnoses and all orders for this visit:  Hyperlipidemia LDL goal <130- LDL goal achieved. Doing well on the statin  -     rosuvastatin (CRESTOR) 20 MG tablet; Take 1 tablet (20 mg total) by mouth daily.  Primary hypertension- Her BP is well controlled. -     amLODipine (NORVASC) 10 MG tablet; Take 1 tablet (10 mg total) by mouth  daily.  Major depressive disorder in full remission, unspecified whether recurrent (HCC)- She is doing well on this combination.  Will continue. -     mirtazapine (REMERON) 15 MG tablet; Take 1 tablet (15 mg total) by mouth at bedtime. -     sertraline (ZOLOFT) 50 MG tablet; Take 1 tablet (50 mg total) by mouth daily.  I have changed Emily Reid's mirtazapine, rosuvastatin, and sertraline. I am also having her maintain her LOSARTAN POTASSIUM PO, buPROPion, Omega-3 Fatty Acids (FISH OIL PO), vitamin C, ergocalciferol, multivitamin, CoQ10, Turmeric (QC TUMERIC COMPLEX PO), OVER  THE COUNTER MEDICATION, OVER THE COUNTER MEDICATION, Phosphatidylserine, and amLODipine.  Meds ordered this encounter  Medications   amLODipine (NORVASC) 10 MG tablet    Sig: Take 1 tablet (10 mg total) by mouth daily.    Dispense:  90 tablet    Refill:  1   mirtazapine (REMERON) 15 MG tablet    Sig: Take 1 tablet (15 mg total) by mouth at bedtime.    Dispense:  90 tablet    Refill:  1   rosuvastatin (CRESTOR) 20 MG tablet    Sig: Take 1 tablet (20 mg total) by mouth daily.    Dispense:  90 tablet    Refill:  1   sertraline (ZOLOFT) 50 MG tablet    Sig: Take 1 tablet (50 mg total) by mouth daily.    Dispense:  90 tablet    Refill:  1     Follow-up: Return in about 6 months (around 10/28/2021).  Sanda Linger, MD

## 2021-04-30 NOTE — Patient Instructions (Signed)

## 2021-05-06 DIAGNOSIS — M545 Low back pain, unspecified: Secondary | ICD-10-CM | POA: Diagnosis not present

## 2021-05-06 DIAGNOSIS — R200 Anesthesia of skin: Secondary | ICD-10-CM | POA: Diagnosis not present

## 2021-05-11 DIAGNOSIS — R200 Anesthesia of skin: Secondary | ICD-10-CM | POA: Diagnosis not present

## 2021-05-11 DIAGNOSIS — M545 Low back pain, unspecified: Secondary | ICD-10-CM | POA: Diagnosis not present

## 2021-05-11 DIAGNOSIS — M25512 Pain in left shoulder: Secondary | ICD-10-CM | POA: Diagnosis not present

## 2021-05-14 DIAGNOSIS — R200 Anesthesia of skin: Secondary | ICD-10-CM | POA: Diagnosis not present

## 2021-05-14 DIAGNOSIS — M545 Low back pain, unspecified: Secondary | ICD-10-CM | POA: Diagnosis not present

## 2021-05-14 DIAGNOSIS — M25512 Pain in left shoulder: Secondary | ICD-10-CM | POA: Diagnosis not present

## 2021-05-19 DIAGNOSIS — R200 Anesthesia of skin: Secondary | ICD-10-CM | POA: Diagnosis not present

## 2021-05-19 DIAGNOSIS — M25512 Pain in left shoulder: Secondary | ICD-10-CM | POA: Diagnosis not present

## 2021-05-19 DIAGNOSIS — M545 Low back pain, unspecified: Secondary | ICD-10-CM | POA: Diagnosis not present

## 2021-05-25 DIAGNOSIS — M25512 Pain in left shoulder: Secondary | ICD-10-CM | POA: Diagnosis not present

## 2021-05-25 DIAGNOSIS — M545 Low back pain, unspecified: Secondary | ICD-10-CM | POA: Diagnosis not present

## 2021-05-25 DIAGNOSIS — R200 Anesthesia of skin: Secondary | ICD-10-CM | POA: Diagnosis not present

## 2021-05-27 ENCOUNTER — Encounter

## 2021-05-27 MED ORDER — LOSARTAN POTASSIUM 100 MG PO TABS
100 MG | ORAL_TABLET | ORAL | 3 refills | Status: AC
Start: 2021-05-27 — End: ?

## 2021-05-27 MED ORDER — BUPROPION HCL ER (XL) 150 MG PO TB24
150 MG | ORAL_TABLET | ORAL | 3 refills | Status: AC
Start: 2021-05-27 — End: ?

## 2021-05-28 DIAGNOSIS — M545 Low back pain, unspecified: Secondary | ICD-10-CM | POA: Diagnosis not present

## 2021-05-28 DIAGNOSIS — R200 Anesthesia of skin: Secondary | ICD-10-CM | POA: Diagnosis not present

## 2021-05-28 DIAGNOSIS — M25512 Pain in left shoulder: Secondary | ICD-10-CM | POA: Diagnosis not present

## 2021-06-08 NOTE — Progress Notes (Signed)
[facility] - Clinic Note  06/10/2021     CHIEF COMPLAINT Patient presents for Retina Follow Up  HISTORY OF PRESENT ILLNESS: Emily Reid is a 75 y.o. female who presents to the clinic today for:  HPI     Retina Follow Up   Patient presents with  CRVO/BRVO.  In right eye.  This started 10 weeks ago.  I, the attending physician,  performed the HPI with the patient and updated documentation appropriately.        Comments   Patient here for 10 weeks retina follow up for BRVO OD. Patient states vision doing pretty good. No eye pain.       Last edited by Rennis Chris, MD on 06/11/2021 11:27 PM.     Referring physician: Lois Huxley MD [address] [place], [place] [address]  HISTORICAL INFORMATION:   Selected notes from the MEDICAL RECORD NUMBER Referred by Dr. Harless Litten for continuation of eye injections OD for BRVO with edema LEE: 08/05/2020 Ocular Hx- BRVO, CME, iritis OD PMH- HTN   CURRENT MEDICATIONS: No current outpatient medications on file. (Ophthalmic Drugs)   No current facility-administered medications for this visit. (Ophthalmic Drugs)   Current Outpatient Medications (Other)  Medication Sig   amLODipine (NORVASC) 10 MG tablet Take 1 tablet (10 mg total) by mouth daily.   buPROPion (WELLBUTRIN XL) 150 MG 24 hr tablet Take 150 mg by mouth daily.   Coenzyme Q10 (COQ10) 100 MG CAPS Take by mouth daily.   ergocalciferol (VITAMIN D2) 1.25 MG (50000 UT) capsule Take by mouth.   LOSARTAN POTASSIUM PO Take 1 tablet by mouth daily.   mirtazapine (REMERON) 15 MG tablet Take 1 tablet (15 mg total) by mouth at bedtime.   Multiple Vitamin (MULTIVITAMIN) tablet Take 1 tablet by mouth daily.   Omega-3 Fatty Acids (FISH OIL PO) Take 1 capsule by mouth 5 (five) times daily.   OVER THE COUNTER MEDICATION    OVER THE COUNTER MEDICATION    Phosphatidylserine 100 MG CAPS Take by mouth.   rosuvastatin (CRESTOR) 20 MG tablet Take 1 tablet (20 mg  total) by mouth daily.   sertraline (ZOLOFT) 50 MG tablet Take 1 tablet (50 mg total) by mouth daily.   Turmeric (QC TUMERIC COMPLEX PO) Take 553 mg by mouth daily.   vitamin C (ASCORBIC ACID) 500 MG tablet Take 500 mg by mouth daily.   No current facility-administered medications for this visit. (Other)   REVIEW OF SYSTEMS: ROS   Positive for: Genitourinary, Eyes Negative for: Constitutional, Gastrointestinal, Neurological, Skin, Musculoskeletal, HENT, Endocrine, Cardiovascular, Respiratory, Psychiatric, Allergic/Imm, Heme/Lymph Last edited by Laddie Aquas, COA on 06/10/2021  1:40 PM.     ALLERGIES No Known Allergies  PAST MEDICAL HISTORY Past Medical History:  Diagnosis Date   Cataract    Mixed OU   Hyperlipidemia    Hypertension    Hypertensive retinopathy    OU   Urine incontinence    Past Surgical History:  Procedure Laterality Date   FACIAL COSMETIC SURGERY     TONSILLECTOMY  1957    FAMILY HISTORY Family History  Problem Relation Age of Onset   Heart disease Mother    Hyperlipidemia Mother    Hypertension Mother    Stroke Mother    SOCIAL HISTORY Social History   Tobacco Use   Smoking status: Never   Smokeless tobacco: Never  Vaping Use   Vaping Use: Never used  Substance Use Topics  Alcohol use: Yes    Alcohol/week: 7.0 standard drinks    Types: 7 Glasses of wine per week   Drug use: No       OPHTHALMIC EXAM:  Base Eye Exam     Visual Acuity (Snellen - Linear)       Right Left   Dist cc 20/20 -1 20/20    Correction: Glasses         Tonometry (Tonopen, 1:38 PM)       Right Left   Pressure 19 16         Pupils       Dark Light Shape React APD   Right 3 2 Round Brisk None   Left 3 2 Round Brisk None         Visual Fields (Counting fingers)       Left Right    Full Full         Extraocular Movement       Right Left    Full, Ortho Full, Ortho         Neuro/Psych     Oriented x3: Yes   Mood/Affect:  Normal         Dilation     Both eyes: 1.0% Mydriacyl, 2.5% Phenylephrine @ 1:38 PM           Slit Lamp and Fundus Exam     Slit Lamp Exam       Right Left   Lids/Lashes Dermatochalasis - upper lid Dermatochalasis - upper lid   Conjunctiva/Sclera White and quiet White and quiet   Cornea 1+ Punctate epithelial erosions, early band K nasal and temporal 1+ Punctate epithelial erosions, early band K nasal and temporal   Anterior Chamber Deep and quiet Deep and quiet   Iris Round and dilated Round and dilated   Lens 2+ Nuclear sclerosis, 1-2+ Cortical cataract, focal Posterior subcapsular cataract at 0700 2+ Nuclear sclerosis, 2+ Cortical cataract, focal Posterior subcapsular cataract at 0700   Anterior Vitreous Vitreous syneresis, silicone oil micro bubbles Vitreous syneresis, silicone oil micro bubbles, Posterior vitreous detachment         Fundus Exam       Right Left   Disc Pink and Sharp, mild PPA Pink and Sharp   C/D Ratio 0.3 0.2   Macula Flat, Blunted foveal reflex, Drusen, RPE mottling, No heme or edema Flat, Blunted foveal reflex, Drusen, RPE mottling and clumping, No heme or edema   Vessels attenuated, mild tortuousity attenuated, mild tortuousity   Periphery Attached, no heme    Attached, mild mid zonal drusen, no heme              Refraction     Wearing Rx       Sphere Cylinder Axis Add   Right -0.50 +0.25 031 +2.50   Left +1.00 Sphere  +2.50           IMAGING AND PROCEDURES  Imaging and Procedures for 06/10/2021  OCT, Retina - OU - Both Eyes       Right Eye Quality was good. Central Foveal Thickness: 292. Progression has been stable. Findings include normal foveal contour, no IRF, no SRF (Hx of BRVO, trace cystic changes and irregular lamination inferior macula--mild, stable).   Left Eye Quality was good. Central Foveal Thickness: 294. Progression has been stable. Findings include normal foveal contour, no IRF, no SRF, retinal drusen .    Notes *Images captured and stored on drive  Diagnosis / Impression:  OD: Hx  of BRVO w/ trace cystic changes and irregular lamination inferior macula--mild, stable OS: NFP, no IRF/SRF, +drusen  Clinical management:  See below  Abbreviations: NFP - Normal foveal profile. CME - cystoid macular edema. PED - pigment epithelial detachment. IRF - intraretinal fluid. SRF - subretinal fluid. EZ - ellipsoid zone. ERM - epiretinal membrane. ORA - outer retinal atrophy. ORT - outer retinal tubulation. SRHM - subretinal hyper-reflective material. IRHM - intraretinal hyper-reflective material      Intravitreal Injection, Pharmacologic Agent - OD - Right Eye       Time Out 06/10/2021. 1:57 PM. Confirmed correct patient, procedure, site, and patient consented.   Anesthesia Topical anesthesia was used. Anesthetic medications included Lidocaine 2%, Proparacaine 0.5%.   Procedure Preparation included 5% betadine to ocular surface, eyelid speculum. A supplied needle was used.   Injection: 1.25 mg Bevacizumab 1.25mg /0.55ml   Route: Intravitreal, Site: Right Eye   NDC: [id number], Lot: [id number] , Expiration date: 08/04/2021, Waste: 0 mL   Post-op Post injection exam found visual acuity of at least counting fingers. The patient tolerated the procedure well. There were no complications. The patient received written and verbal post procedure care education.            ASSESSMENT/PLAN:    ICD-10-CM   1. Branch retinal vein occlusion of right eye with macular edema  H34.8310 OCT, Retina - OU - Both Eyes    Intravitreal Injection, Pharmacologic Agent - OD - Right Eye    Bevacizumab (AVASTIN) SOLN 1.25 mg    2. Essential hypertension  I10     3. Hypertensive retinopathy of both eyes  H35.033     4. Combined forms of age-related cataract of both eyes  H25.813      1,2. BRVO w/ CME OD             - s/p IVA OD #1 (02.04.22), #2 (05.23.22), #3 (7.25.22), #4 (10.05.22)  - hx of injections  with Dr. 12.05.22 in [place] [place] -- pt was receiving Lucentis q7-8 wks; pt reports history Avastin, but was switched to Lucentis at some point - last injection IVL OD was 02.04.22 (7.5 wks) - pt reports hx of worsening vision and OCT around 10 weeks - BCVA OD is 20/20 - OCT shows trace cystic changes and irregular lamination inferior macula at 10 wks - recommend IVA OD #5 today, 12.14.22 -- maintenance therapy w/ f/u in 12 wks - RBA of procedure discussed, questions answered - informed consent obtained and signed - see procedure note - F/U 12 weeks -- DFE/OCT/possible injection  3,4. Hypertensive retinopathy OU - discussed importance of tight BP control - monitor  5. Mixed Cataract OU - The symptoms of cataract, surgical options, and treatments and risks were discussed with patient. - discussed diagnosis and progression - not yet visually significant - monitor for now  Ophthalmic Meds Ordered this visit:  Meds ordered this encounter  Medications   Bevacizumab (AVASTIN) SOLN 1.25 mg      Return in about 12 weeks (around 09/02/2021) for f/u BRVO OD, DFE.  There are no Patient Instructions on file for this visit.  This document serves as a record of services personally performed by 11/02/2021, MD, PhD. It was created on their behalf by Karie Chimera, an ophthalmic technician. The creation of this record is the provider's dictation and/or activities during the visit.    Electronically signed by: De Blanch, OA, 06/11/21  11:30 PM  This document serves as a record of services personally performed  by Karie Chimera, MD, PhD. It was created on their behalf by Glee Arvin. Manson Passey, OA an ophthalmic technician. The creation of this record is the provider's dictation and/or activities during the visit.    Electronically signed by: Glee Arvin. Manson Passey, [place] 12.14.2022 11:30 PM   Karie Chimera, M.D., Ph.D. Diseases & Surgery of the Retina and Vitreous [facility]  I have reviewed the above documentation for accuracy and completeness, and I agree with the above. Karie Chimera, M.D., Ph.D. 06/11/21 11:32 PM   Abbreviations: M myopia (nearsighted); A astigmatism; H hyperopia (farsighted); P presbyopia; Mrx spectacle prescription;  CTL contact lenses; OD right eye; OS left eye; OU both eyes  XT exotropia; ET esotropia; PEK punctate epithelial keratitis; PEE punctate epithelial erosions; DES dry eye syndrome; MGD meibomian gland dysfunction; ATs artificial tears; PFAT's preservative free artificial tears; NSC nuclear sclerotic cataract; PSC posterior subcapsular cataract; ERM epi-retinal membrane; PVD posterior vitreous detachment; RD retinal detachment; DM diabetes mellitus; DR diabetic retinopathy; NPDR non-proliferative diabetic retinopathy; PDR proliferative diabetic retinopathy; CSME clinically significant macular edema; DME diabetic macular edema; dbh dot blot hemorrhages; CWS cotton wool spot; POAG primary open angle glaucoma; C/D cup-to-disc ratio; HVF humphrey visual field; GVF goldmann visual field; OCT optical coherence tomography; IOP intraocular pressure; BRVO Branch retinal vein occlusion; CRVO central retinal vein occlusion; CRAO central retinal artery occlusion; BRAO branch retinal artery occlusion; RT retinal tear; SB scleral buckle; PPV pars plana vitrectomy; VH Vitreous hemorrhage; PRP panretinal laser photocoagulation; IVK intravitreal kenalog; VMT vitreomacular traction; MH Macular hole;  NVD neovascularization of the disc; NVE neovascularization elsewhere; AREDS age related eye disease study; ARMD age related macular degeneration; POAG primary open angle glaucoma; EBMD epithelial/anterior basement membrane dystrophy; ACIOL anterior chamber intraocular lens; IOL intraocular lens; PCIOL posterior chamber intraocular lens; Phaco/IOL phacoemulsification with intraocular lens placement; PRK photorefractive keratectomy; LASIK laser assisted in situ  keratomileusis; HTN hypertension; DM diabetes mellitus; COPD chronic obstructive pulmonary disease

## 2021-06-09 DIAGNOSIS — R200 Anesthesia of skin: Secondary | ICD-10-CM | POA: Diagnosis not present

## 2021-06-09 DIAGNOSIS — M545 Low back pain, unspecified: Secondary | ICD-10-CM | POA: Diagnosis not present

## 2021-06-09 DIAGNOSIS — M25512 Pain in left shoulder: Secondary | ICD-10-CM | POA: Diagnosis not present

## 2021-06-10 ENCOUNTER — Other Ambulatory Visit: Payer: Self-pay

## 2021-06-10 ENCOUNTER — Encounter (INDEPENDENT_AMBULATORY_CARE_PROVIDER_SITE_OTHER): Payer: Self-pay | Admitting: Ophthalmology

## 2021-06-10 ENCOUNTER — Ambulatory Visit (INDEPENDENT_AMBULATORY_CARE_PROVIDER_SITE_OTHER): Payer: Medicare PPO | Admitting: Ophthalmology

## 2021-06-10 DIAGNOSIS — H3581 Retinal edema: Secondary | ICD-10-CM

## 2021-06-10 DIAGNOSIS — I10 Essential (primary) hypertension: Secondary | ICD-10-CM

## 2021-06-10 DIAGNOSIS — H25813 Combined forms of age-related cataract, bilateral: Secondary | ICD-10-CM | POA: Diagnosis not present

## 2021-06-10 DIAGNOSIS — H34831 Tributary (branch) retinal vein occlusion, right eye, with macular edema: Secondary | ICD-10-CM

## 2021-06-10 DIAGNOSIS — H35033 Hypertensive retinopathy, bilateral: Secondary | ICD-10-CM | POA: Diagnosis not present

## 2021-06-11 ENCOUNTER — Encounter (INDEPENDENT_AMBULATORY_CARE_PROVIDER_SITE_OTHER): Payer: Self-pay | Admitting: Ophthalmology

## 2021-06-11 DIAGNOSIS — H34831 Tributary (branch) retinal vein occlusion, right eye, with macular edema: Secondary | ICD-10-CM

## 2021-06-11 DIAGNOSIS — I10 Essential (primary) hypertension: Secondary | ICD-10-CM | POA: Diagnosis not present

## 2021-06-11 DIAGNOSIS — H25813 Combined forms of age-related cataract, bilateral: Secondary | ICD-10-CM | POA: Diagnosis not present

## 2021-06-11 DIAGNOSIS — H35033 Hypertensive retinopathy, bilateral: Secondary | ICD-10-CM | POA: Diagnosis not present

## 2021-06-11 MED ORDER — BEVACIZUMAB CHEMO INJECTION 1.25MG/0.05ML SYRINGE FOR KALEIDOSCOPE
1.2500 mg | INTRAVITREAL | Status: AC | PRN
  Administered 2021-06-11: 1.25 mg via INTRAVITREAL

## 2021-06-18 DIAGNOSIS — M25512 Pain in left shoulder: Secondary | ICD-10-CM | POA: Diagnosis not present

## 2021-06-18 DIAGNOSIS — M545 Low back pain, unspecified: Secondary | ICD-10-CM | POA: Diagnosis not present

## 2021-06-18 DIAGNOSIS — R200 Anesthesia of skin: Secondary | ICD-10-CM | POA: Diagnosis not present

## 2021-07-06 DIAGNOSIS — R200 Anesthesia of skin: Secondary | ICD-10-CM | POA: Diagnosis not present

## 2021-07-06 DIAGNOSIS — M25512 Pain in left shoulder: Secondary | ICD-10-CM | POA: Diagnosis not present

## 2021-07-06 DIAGNOSIS — M545 Low back pain, unspecified: Secondary | ICD-10-CM | POA: Diagnosis not present

## 2021-07-07 ENCOUNTER — Other Ambulatory Visit: Payer: Self-pay

## 2021-07-07 ENCOUNTER — Ambulatory Visit: Payer: Self-pay

## 2021-07-07 ENCOUNTER — Ambulatory Visit (INDEPENDENT_AMBULATORY_CARE_PROVIDER_SITE_OTHER)
Admission: RE | Admit: 2021-07-07 | Discharge: 2021-07-07 | Disposition: A | Discharge status: Home / Self Care | Payer: Medicare PPO | Source: Ambulatory Visit | Attending: Family Medicine | Admitting: Family Medicine

## 2021-07-07 ENCOUNTER — Ambulatory Visit: Payer: Medicare PPO | Admitting: Family Medicine

## 2021-07-07 ENCOUNTER — Encounter: Payer: Self-pay | Admitting: Family Medicine

## 2021-07-07 VITALS — BP 130/84 | HR 83 | Ht 66.0 in | Wt 145.4 lb

## 2021-07-07 DIAGNOSIS — G8929 Other chronic pain: Secondary | ICD-10-CM

## 2021-07-07 DIAGNOSIS — M25512 Pain in left shoulder: Secondary | ICD-10-CM

## 2021-07-07 DIAGNOSIS — M19012 Primary osteoarthritis, left shoulder: Secondary | ICD-10-CM | POA: Diagnosis not present

## 2021-07-07 DIAGNOSIS — S4992XA Unspecified injury of left shoulder and upper arm, initial encounter: Secondary | ICD-10-CM | POA: Diagnosis not present

## 2021-07-07 NOTE — Progress Notes (Signed)
I, Christoper Fabian, LAT, ATC, am serving as scribe for Dr. Clementeen Graham.  Subjective:    CC: Shoulder pain  HPI: Pt is a 76 y/o female c/o L shoulder pain x approximately 5 months that was associated w/ lifting weights during aerobics. Pt locates pain to her L lateral shoulder and upper arm. She has been to physical therapy for this at [facility] for at least 2 months for at least 6 sessions dedicated to this issue.  Neck pain: yes; hx of neck pain and cervical compression Radiates: yes into  UE Numbness/tingling: No Aggravates: L shoulder functional IR; L shoulder flexion AROM above 90 deg; L shoulder horiz aBd Treatments tried: accupuncture; [facility] PT; Advil; heat; ice  Dx imaging: 12/30/20 C-spine MRI   Pertinent review of Systems: No fevers or chills  Relevant historical information: Kidney disease.   Objective:    Vitals:   07/07/21 1334  BP: 130/84  Pulse: 83  SpO2: 96%   General: Well Developed, well nourished, and in no acute distress.   MSK: Left shoulder normal-appearing Mildly tender palpation AC joint. Range of motion abduction full Forward flexion 110 degrees. External rotation full.  Internal rotation lumbar spine. Strength abduction 4+/5 external rotation full internal rotation full. Positive empty can test.  Positive Hawkins and Neer's test. Negative Yergason's and speeds test.  Lab and Radiology Results  Procedure: Real-time Ultrasound Guided Injection of left shoulder subdeltoid bursa Device: Philips Affiniti 50G Images permanently stored and available for review in PACS Ultrasound evaluation prior to injection reveals intact rotator cuff tendons but large subcoracoid and subdeltoid bursitis is present.  AC DJD is present. Verbal informed consent obtained.  Discussed risks and benefits of procedure. Warned about infection bleeding damage to structures skin hypopigmentation and fat atrophy among others. Patient expresses understanding and  agreement Time-out conducted.   Noted no overlying erythema, induration, or other signs of local infection.   Skin prepped in a sterile fashion.   Local anesthesia: Topical Ethyl chloride.   With sterile technique and under real time ultrasound guidance: 40 mg of Kenalog and 2 mL of Marcaine injected into subdeltoid bursa. Fluid seen entering the bursa.   Completed without difficulty   Pain immediately resolved suggesting accurate placement of the medication.   Advised to call if fevers/chills, erythema, induration, drainage, or persistent bleeding.   Images permanently stored and available for review in the ultrasound unit.  Impression: Technically successful ultrasound guided injection.    X-ray images left shoulder obtained today personally and independently interpreted Mild glenohumeral DJD.  No acute fractures. Await formal radiology review    Impression and Recommendations:    Assessment and Plan: 76 y.o. female with left shoulder pain thought to be due to rotator cuff tendinopathy impingement and subdeltoid/subacromial bursitis.  Patient has done an excellent job with conservative management already including oral NSAIDs, and physical therapy for greater than 6 weeks.  At this point given her ultrasound findings showing significant subdeltoid bursitis plan for subdeltoid injection and continue home exercise program and PT.  Additionally obtain a shoulder x-ray. If not improved next step should be MRI for other injection or potential surgical planning.Marland Kitchen  PDMP not reviewed this encounter. Orders Placed This Encounter  Procedures   [place] LIMITED JOINT SPACE STRUCTURES UP LEFT(NO LINKED CHARGES)    Order Specific Question:   Reason for Exam (SYMPTOM  OR DIAGNOSIS REQUIRED)    Answer:   L shoulder pain    Order Specific Question:   Preferred imaging  location?    Answer:   [facility] Shoulder Left    Standing Status:   Future    Standing Expiration  Date:   08/07/2021    Order Specific Question:   Reason for Exam (SYMPTOM  OR DIAGNOSIS REQUIRED)    Answer:   L shoulder pain    Order Specific Question:   Preferred imaging location?    Answer:   [facility]   No orders of the defined types were placed in this encounter.   Discussed warning signs or symptoms. Please see discharge instructions. Patient expresses understanding.   The above documentation has been reviewed and is accurate and complete Clementeen Graham, M.D.

## 2021-07-07 NOTE — Patient Instructions (Addendum)
Nice to meet you.  You had a L shoulder injection.  Call or go to the ER if you develop a large red swollen joint with extreme pain or oozing puss.   Keep up the exercises you were shown at PT.  Please get an Xray today at the [facility] office.  Follow-up: as needed

## 2021-07-08 ENCOUNTER — Telehealth: Payer: Self-pay | Admitting: Family Medicine

## 2021-07-08 DIAGNOSIS — G8929 Other chronic pain: Secondary | ICD-10-CM

## 2021-07-08 DIAGNOSIS — M25512 Pain in left shoulder: Secondary | ICD-10-CM

## 2021-07-08 NOTE — Telephone Encounter (Signed)
MRI ordered

## 2021-07-08 NOTE — Progress Notes (Signed)
Left shoulder x-ray shows arthritis.  Radiology sees a little area on the ball of the shoulder joint that they are worried about normal me to get an MRI.  This will be helpful anyway which should answer questions about rotator cuff problems as well.  I will order an MRI now to evaluate this further.

## 2021-07-14 DIAGNOSIS — F419 Anxiety disorder, unspecified: Secondary | ICD-10-CM | POA: Diagnosis not present

## 2021-07-14 DIAGNOSIS — E785 Hyperlipidemia, unspecified: Secondary | ICD-10-CM | POA: Diagnosis not present

## 2021-07-14 DIAGNOSIS — Z1339 Encounter for screening examination for other mental health and behavioral disorders: Secondary | ICD-10-CM | POA: Diagnosis not present

## 2021-07-14 DIAGNOSIS — I10 Essential (primary) hypertension: Secondary | ICD-10-CM | POA: Diagnosis not present

## 2021-07-14 DIAGNOSIS — Z1331 Encounter for screening for depression: Secondary | ICD-10-CM | POA: Diagnosis not present

## 2021-07-14 DIAGNOSIS — M5136 Other intervertebral disc degeneration, lumbar region: Secondary | ICD-10-CM | POA: Diagnosis not present

## 2021-07-15 DIAGNOSIS — M25512 Pain in left shoulder: Secondary | ICD-10-CM | POA: Diagnosis not present

## 2021-07-15 DIAGNOSIS — R200 Anesthesia of skin: Secondary | ICD-10-CM | POA: Diagnosis not present

## 2021-07-15 DIAGNOSIS — M545 Low back pain, unspecified: Secondary | ICD-10-CM | POA: Diagnosis not present

## 2021-07-28 DIAGNOSIS — M25512 Pain in left shoulder: Secondary | ICD-10-CM | POA: Diagnosis not present

## 2021-07-28 DIAGNOSIS — M545 Low back pain, unspecified: Secondary | ICD-10-CM | POA: Diagnosis not present

## 2021-07-28 DIAGNOSIS — R200 Anesthesia of skin: Secondary | ICD-10-CM | POA: Diagnosis not present

## 2021-07-29 DIAGNOSIS — M25552 Pain in left hip: Secondary | ICD-10-CM | POA: Diagnosis not present

## 2021-08-02 ENCOUNTER — Other Ambulatory Visit: Payer: Self-pay

## 2021-08-02 ENCOUNTER — Ambulatory Visit
Admission: RE | Admit: 2021-08-02 | Discharge: 2021-08-02 | Disposition: A | Discharge status: Home / Self Care | Payer: Medicare PPO | Source: Ambulatory Visit | Attending: Family Medicine | Admitting: Family Medicine

## 2021-08-02 DIAGNOSIS — S46112A Strain of muscle, fascia and tendon of long head of biceps, left arm, initial encounter: Secondary | ICD-10-CM | POA: Diagnosis not present

## 2021-08-02 DIAGNOSIS — S46012A Strain of muscle(s) and tendon(s) of the rotator cuff of left shoulder, initial encounter: Secondary | ICD-10-CM | POA: Diagnosis not present

## 2021-08-02 DIAGNOSIS — M19012 Primary osteoarthritis, left shoulder: Secondary | ICD-10-CM | POA: Diagnosis not present

## 2021-08-02 DIAGNOSIS — G8929 Other chronic pain: Secondary | ICD-10-CM

## 2021-08-03 NOTE — Progress Notes (Signed)
Left shoulder MRI shows a partial tear of one of the rotator cuff tendons and a possible tear of one of the other rotator cuff tendons.  Additionally the biceps tendon may have some tendinitis with partial tearing.  You do have some arthritis within the shoulder joint as well.  Recommend return to clinic to go over the results of full detail and discuss potential treatment options including surgical referral.  If you would like to just talk to a surgeon I can refer you now.

## 2021-08-04 DIAGNOSIS — M545 Low back pain, unspecified: Secondary | ICD-10-CM | POA: Diagnosis not present

## 2021-08-04 DIAGNOSIS — R200 Anesthesia of skin: Secondary | ICD-10-CM | POA: Diagnosis not present

## 2021-08-04 DIAGNOSIS — M25512 Pain in left shoulder: Secondary | ICD-10-CM | POA: Diagnosis not present

## 2021-08-05 ENCOUNTER — Other Ambulatory Visit: Payer: Self-pay

## 2021-08-05 ENCOUNTER — Ambulatory Visit: Payer: Medicare PPO | Admitting: Family Medicine

## 2021-08-05 VITALS — BP 120/80 | HR 76 | Ht 66.0 in | Wt 144.0 lb

## 2021-08-05 DIAGNOSIS — M25512 Pain in left shoulder: Secondary | ICD-10-CM

## 2021-08-05 DIAGNOSIS — G8929 Other chronic pain: Secondary | ICD-10-CM | POA: Diagnosis not present

## 2021-08-05 NOTE — Progress Notes (Signed)
I, Emily Reid, LAT, ATC acting as a scribe for Emily Graham, MD.  Emily Reid is a 76 y.o. female who presents to [facility] today for f/u chronic L shoulder pain and MRI review. She has been to physical therapy for this at [facility] PT for at least 2 months for at least 6 sessions dedicated to this issue. Pt was last seen by Dr. Denyse Reid on 07/07/21 and was given a L subdeltoid steroid injection. Today, pt reports that she is okay still has a little pain but steroid injection knocked out most of the pain   Dx imaging: 08/02/21 L shoulder MRI 07/07/21 L shoulder XR 12/30/20 C-spine MRI  Pertinent review of systems: No fevers or chills  Relevant historical information: CKD 3   Exam:  BP 120/80    Pulse 76    Ht 5\' 6"  (1.676 m)    Wt 144 lb (65.3 kg)    SpO2 96%    BMI 23.24 kg/m  General: Well Developed, well nourished, and in no acute distress.   MSK: Left shoulder: Normal-appearing Nontender. Normal motion.    Lab and Radiology Results No results found for this or any previous visit (from the past 72 hour(s)). MR SHOULDER LEFT WO CONTRAST  Result Date: 08/03/2021 CLINICAL DATA:  Shoulder pain, chronic. Injury lifting weights. Further evaluation of sclerotic focus overlying the anterior aspect of the humeral head on axillary radiograph. EXAM: MRI OF THE LEFT SHOULDER WITHOUT CONTRAST TECHNIQUE: Multiplanar, multisequence MR imaging of the shoulder was performed. No intravenous contrast was administered. COMPARISON:  Left shoulder radiographs 07/07/2021 FINDINGS: Rotator cuff: There is high-grade partial-thickness tearing of the mid and anterior supraspinatus tendon footprint and region measuring up to 15 mm in AP dimension (sagittal image 4). There is greater than 90% tearing of the articular greater than bursal sided tendon fibers with up to 13 mm tendon retraction (coronal images 10 through 12). Mild diffuse infraspinatus tendinosis. Mild fluid at the  anterior and deep aspects of the infraspinatus musculotendinous junction (sagittal image 10). May be mild attenuation of the insertion of the deep articular sided fibers of the subscapularis tendon. The teres minor is intact. Muscles: No rotator cuff muscle atrophy, fatty infiltration, or edema. Biceps long head: Mild proximal long head of the biceps tendinosis. There also may be mild partial-thickness tearing and attenuation of the proximal biceps tendon proximal to the bicipital groove. There is high-grade attenuation of the tendon within the superior aspect of the bicipital groove (axial images 16-18), and interstitial tearing within the tendon within the more distal aspect of the bicipital groove (axial image 27). Acromioclavicular Joint: There are mild degenerative changes of the acromioclavicular joint including joint space narrowing, subchondral marrow edema, and peripheral osteophytosis. Type II acromion. Moderate fluid within the subacromial/subdeltoid bursa. Glenohumeral Joint: Moderate thinning of the inferior medial humeral head cartilage and mid and superior glenoid cartilage with mild peripheral glenoid degenerative osteophytes. Labrum: Mild degenerative irregularity of the glenoid labrum diffusely. Bones: Unfortunately, there is moderate patient motion on axial T2 weighted images. There is minimal patient motion on axial proton density weighted images. On axillary radiograph 07/07/2021 there was question of a sclerotic region within the humeral head in the region anteriorly that also overlaps the acromion. No definite abnormality is seen within this region of the humeral head on MRI, and this may have been related to bone overlap in this region including degenerative osteophytosis extending anteriorly from the inferior aspect of the lesser tuberosity  of the humeral head (axial images 18 and 19). No suspicious bone marrow lesion is seen. IMPRESSION:: IMPRESSION: 1. No suspicious bone lesion is seen,  with attention to the anterior humeral head where there was question of sclerosis on prior radiographs. This appearance on radiographs may have been due to overlapping bones of the humeral head and acromion including moderate anteriorly directed osteophytes at the inferior aspect of the lesser tuberosity of the humeral head. 2. High-grade partial-thickness tearing of the mid and anterior supraspinatus tendon footprint. 3. Likely mild partial-thickness tearing of the subscapularis tendon insertion. 4. Proximal long head of the biceps tendinosis with mild partial-thickness tearing. 5. Mild degenerative changes of the acromioclavicular joint and mild-to-moderate degenerative changes of the glenohumeral joint. Electronically Signed   By: Emily Reid M.D.   On: 08/03/2021 10:11    I, Emily Reid, personally (independently) visualized and performed the interpretation of the images attached in this note.    Assessment and Plan: 76 y.o. female with left shoulder pain.  Originally did not do as well as we would like with conservative management however currently not much pain after steroid injection.  However her MRI does show quite a bit of dysfunction especially with rotator cuff tears of the supraspinatus and subscapularis tendons. After discussion I think it is worthwhile for at least for her to discuss surgical possibilities with an orthopedic surgeon.  I do not think right now surgery makes sense however I am not optimistic that the steroid injection that she received recently is going to provide lasting benefit and understanding her surgical options in the future should her pain return is a good idea.  Recheck back with me as needed.   PDMP not reviewed this encounter. Orders Placed This Encounter  Procedures   Ambulatory referral to Orthopedic Surgery    Referral Priority:   Routine    Referral Type:   Surgical    Referral Reason:   Specialty Services Required    Referred to Provider:   Huel Cote, MD    Requested Specialty:   Orthopedic Surgery    Number of Visits Requested:   1   No orders of the defined types were placed in this encounter.    Discussed warning signs or symptoms. Please see discharge instructions. Patient expresses understanding.   The above documentation has been reviewed and is accurate and complete Emily Reid, M.D.  Total encounter time 20 minutes including face-to-face time with the patient and, reviewing past medical record, and charting on the date of service.   Reviewed imaging and report I discussed treatment plan and options.

## 2021-08-05 NOTE — Patient Instructions (Addendum)
Good to see you Referral for surgical consultation As needed follow up

## 2021-08-07 ENCOUNTER — Other Ambulatory Visit: Payer: Self-pay

## 2021-08-07 ENCOUNTER — Ambulatory Visit (HOSPITAL_BASED_OUTPATIENT_CLINIC_OR_DEPARTMENT_OTHER): Payer: Medicare PPO | Admitting: Orthopaedic Surgery

## 2021-08-07 DIAGNOSIS — M75111 Incomplete rotator cuff tear or rupture of right shoulder, not specified as traumatic: Secondary | ICD-10-CM | POA: Diagnosis not present

## 2021-08-07 NOTE — Progress Notes (Signed)
Chief Complaint: Left shoulder pain    History of Present Illness:    Emily Reid is a 76 y.o. female right-hand-dominant female with left shoulder pain who presents as a referral for ongoing management.  She has previously had an injection in January which gave her significant relief.  She describes 1 out of 10 shoulder pain at this point.  She does have pain with lifting overhead.  This has been minimal after the injection.  She is completed 2 sessions of physical therapy.  She is very active and enjoys walking 5 to 6 miles a day.  She previously has tried massage therapy as well as dry needling with minimal relief.    Surgical History:   None  PMH/PSH/Family History/Social History/Meds/Allergies:    Past Medical History:  Diagnosis Date   Cataract    Mixed OU   Hyperlipidemia    Hypertension    Hypertensive retinopathy    OU   Urine incontinence    Past Surgical History:  Procedure Laterality Date   FACIAL COSMETIC SURGERY     TONSILLECTOMY  1957   Social History   Socioeconomic History   Marital status: Married    Spouse name: Not on file   Number of children: Not on file   Years of education: Not on file   Highest education level: Not on file  Occupational History   Not on file  Tobacco Use   Smoking status: Never   Smokeless tobacco: Never  Vaping Use   Vaping Use: Never used  Substance and Sexual Activity   Alcohol use: Yes    Alcohol/week: 7.0 standard drinks    Types: 7 Glasses of wine per week   Drug use: No   Sexual activity: Yes    Partners: Male  Other Topics Concern   Not on file  Social History Narrative   Not on file   Social Determinants of Health   Financial Resource Strain: Not on file  Food Insecurity: Not on file  Transportation Needs: Not on file  Physical Activity: Not on file  Stress: Not on file  Social Connections: Not on file   Family History  Problem Relation Age of Onset   Heart  disease Mother    Hyperlipidemia Mother    Hypertension Mother    Stroke Mother    No Known Allergies Current Outpatient Medications  Medication Sig Dispense Refill   amLODipine (NORVASC) 10 MG tablet Take 1 tablet (10 mg total) by mouth daily. 90 tablet 1   buPROPion (WELLBUTRIN XL) 150 MG 24 hr tablet Take 150 mg by mouth daily.     Coenzyme Q10 (COQ10) 100 MG CAPS Take by mouth daily.     ergocalciferol (VITAMIN D2) 1.25 MG (50000 UT) capsule Take by mouth.     LOSARTAN POTASSIUM PO Take 1 tablet by mouth daily.     mirtazapine (REMERON) 15 MG tablet Take 1 tablet (15 mg total) by mouth at bedtime. 90 tablet 1   Multiple Vitamin (MULTIVITAMIN) tablet Take 1 tablet by mouth daily.     Omega-3 Fatty Acids (FISH OIL PO) Take 1 capsule by mouth 5 (five) times daily.     OVER THE COUNTER MEDICATION      OVER THE COUNTER MEDICATION      Phosphatidylserine 100 MG CAPS Take by  mouth.     rosuvastatin (CRESTOR) 20 MG tablet Take 1 tablet (20 mg total) by mouth daily. 90 tablet 1   sertraline (ZOLOFT) 50 MG tablet Take 1 tablet (50 mg total) by mouth daily. 90 tablet 1   Turmeric (QC TUMERIC COMPLEX PO) Take 553 mg by mouth daily.     vitamin C (ASCORBIC ACID) 500 MG tablet Take 500 mg by mouth daily.     No current facility-administered medications for this visit.   No results found.  Review of Systems:   A ROS was performed including pertinent positives and negatives as documented in the HPI.  Physical Exam :   Constitutional: NAD and appears stated age Neurological: Alert and oriented Psych: Appropriate affect and cooperative There were no vitals taken for this visit.   Comprehensive Musculoskeletal Exam:    Musculoskeletal Exam    Inspection Right Left  Skin No atrophy or winging No atrophy or winging  Palpation    Tenderness None Greater tuberosity  Range of Motion    Flexion (passive) 170 170  Flexion (active) 170 170  Abduction 170 170  ER at the side 70 70  Can reach  behind back to T12 T12  Strength     Full Full  Special Tests    Pseudoparalytic No No  Neurologic    Fires PIN, radial, median, ulnar, musculocutaneous, axillary, suprascapular, long thoracic, and spinal accessory innervated muscles. No abnormal sensibility  Vascular/Lymphatic    Radial Pulse 2+ 2+  Cervical Exam    Patient has symmetric cervical range of motion with negative Spurling's test.  Special Test:      Imaging:   Xray (3 views left shoulder): Normal  MRI (left shoulder): Partial-thickness supraspinatus rotator cuff tear as well as biceps tearing and tendinitis  I personally reviewed and interpreted the radiographs.   Assessment:   Very pleasant 76 year old female with left shoulder pain consistent with a partial rotator cuff tear.  I did describe the natural progression of rotator cuff tearing.  I did discuss that while this is partial these tend to do go on to completion.  If that were to happen I would more aggressively recommend surgical arthroscopy.  For the time being her pain is extremely well controlled after an injection.  While this pain is well controlled I would recommend that she perform a band program on her own which specifically showed her.  She will purchase bands offline and work on this.  I would like to see her back in 2 weeks.  We discussed the role for possible additional injections steroid versus PRP should the injection wear off.  We discussed the risks and benefits of surgery versus continued management with strengthening and rehabilitation.  I do think this is a viable option for her as she does have some portion of the rotator cuff that is intact.  At this time she would like to continue with observant management and strengthening of the shoulder.  Plan :    -She will see me back in 2 weeks for reassessment     I personally saw and evaluated the patient, and participated in the management and treatment plan.  Huel Cote, MD Attending  Physician, Orthopedic Surgery  This document was dictated using Dragon voice recognition software. A reasonable attempt at proof reading has been made to minimize errors.

## 2021-08-08 ENCOUNTER — Encounter (HOSPITAL_BASED_OUTPATIENT_CLINIC_OR_DEPARTMENT_OTHER): Payer: Self-pay | Admitting: Orthopaedic Surgery

## 2021-08-10 DIAGNOSIS — M25512 Pain in left shoulder: Secondary | ICD-10-CM | POA: Diagnosis not present

## 2021-08-10 DIAGNOSIS — M545 Low back pain, unspecified: Secondary | ICD-10-CM | POA: Diagnosis not present

## 2021-08-10 DIAGNOSIS — R200 Anesthesia of skin: Secondary | ICD-10-CM | POA: Diagnosis not present

## 2021-08-26 DIAGNOSIS — M25512 Pain in left shoulder: Secondary | ICD-10-CM | POA: Diagnosis not present

## 2021-08-26 DIAGNOSIS — R200 Anesthesia of skin: Secondary | ICD-10-CM | POA: Diagnosis not present

## 2021-08-26 DIAGNOSIS — M545 Low back pain, unspecified: Secondary | ICD-10-CM | POA: Diagnosis not present

## 2021-08-31 DIAGNOSIS — M25552 Pain in left hip: Secondary | ICD-10-CM | POA: Diagnosis not present

## 2021-09-02 DIAGNOSIS — M25512 Pain in left shoulder: Secondary | ICD-10-CM | POA: Diagnosis not present

## 2021-09-02 DIAGNOSIS — M545 Low back pain, unspecified: Secondary | ICD-10-CM | POA: Diagnosis not present

## 2021-09-02 DIAGNOSIS — R200 Anesthesia of skin: Secondary | ICD-10-CM | POA: Diagnosis not present

## 2021-09-02 NOTE — Progress Notes (Shared)
[facility] - Clinic Note  09/09/2021     CHIEF COMPLAINT Patient presents for No chief complaint on file.   HISTORY OF PRESENT ILLNESS: Emily Reid is a 76 y.o. female who presents to the clinic today for:    Referring physician: Lois Huxley MD [address] [place], [place] [address]  HISTORICAL INFORMATION:   Selected notes from the MEDICAL RECORD NUMBER Referred by Dr. Harless Litten for continuation of eye injections OD for BRVO with edema LEE: 08/05/2020 Ocular Hx- BRVO, CME, iritis OD PMH- HTN   CURRENT MEDICATIONS: No current outpatient medications on file. (Ophthalmic Drugs)   No current facility-administered medications for this visit. (Ophthalmic Drugs)   Current Outpatient Medications (Other)  Medication Sig   amLODipine (NORVASC) 10 MG tablet Take 1 tablet (10 mg total) by mouth daily.   buPROPion (WELLBUTRIN XL) 150 MG 24 hr tablet Take 150 mg by mouth daily.   Coenzyme Q10 (COQ10) 100 MG CAPS Take by mouth daily.   ergocalciferol (VITAMIN D2) 1.25 MG (50000 UT) capsule Take by mouth.   LOSARTAN POTASSIUM PO Take 1 tablet by mouth daily.   mirtazapine (REMERON) 15 MG tablet Take 1 tablet (15 mg total) by mouth at bedtime.   Multiple Vitamin (MULTIVITAMIN) tablet Take 1 tablet by mouth daily.   Omega-3 Fatty Acids (FISH OIL PO) Take 1 capsule by mouth 5 (five) times daily.   OVER THE COUNTER MEDICATION    OVER THE COUNTER MEDICATION    Phosphatidylserine 100 MG CAPS Take by mouth.   rosuvastatin (CRESTOR) 20 MG tablet Take 1 tablet (20 mg total) by mouth daily.   sertraline (ZOLOFT) 50 MG tablet Take 1 tablet (50 mg total) by mouth daily.   Turmeric (QC TUMERIC COMPLEX PO) Take 553 mg by mouth daily.   vitamin C (ASCORBIC ACID) 500 MG tablet Take 500 mg by mouth daily.   No current facility-administered medications for this visit. (Other)   REVIEW OF SYSTEMS:   ALLERGIES No Known Allergies  PAST MEDICAL HISTORY Past  Medical History:  Diagnosis Date   Cataract    Mixed OU   Hyperlipidemia    Hypertension    Hypertensive retinopathy    OU   Urine incontinence    Past Surgical History:  Procedure Laterality Date   FACIAL COSMETIC SURGERY     TONSILLECTOMY  1957    FAMILY HISTORY Family History  Problem Relation Age of Onset   Heart disease Mother    Hyperlipidemia Mother    Hypertension Mother    Stroke Mother    SOCIAL HISTORY Social History   Tobacco Use   Smoking status: Never   Smokeless tobacco: Never  Vaping Use   Vaping Use: Never used  Substance Use Topics   Alcohol use: Yes    Alcohol/week: 7.0 standard drinks    Types: 7 Glasses of wine per week   Drug use: No       OPHTHALMIC EXAM:  Not recorded    IMAGING AND PROCEDURES  Imaging and Procedures for 09/09/2021          ASSESSMENT/PLAN:  No diagnosis found.  1,2. BRVO w/ CME OD             - s/p IVA OD #1 (02.04.22), #2 (05.23.22), #3 (7.25.22), #4 (10.05.22), #5 (12.14.22)  - hx of injections with Dr. Harless Litten in [place], [place] -- pt was receiving Lucentis q7-8 wks; pt reports history Avastin, but was switched to Lucentis  at some point - last injection IVL OD was 02.04.22 (7.5 wks) - pt reports hx of worsening vision and OCT around 10 weeks - BCVA OD is 20/20 - OCT shows trace cystic changes and irregular lamination inferior macula at 10 wks - recommend IVA OD #6 today, 03.15.23 -- maintenance therapy w/ f/u in 12 wks - RBA of procedure discussed, questions answered - informed consent obtained and signed - see procedure note - F/U 12 weeks -- DFE/OCT/possible injection  3,4. Hypertensive retinopathy OU - discussed importance of tight BP control - monitor  5. Mixed Cataract OU - The symptoms of cataract, surgical options, and treatments and risks were discussed with patient. - discussed diagnosis and progression - not yet visually significant - monitor for now  Ophthalmic Meds Ordered this visit:   No orders of the defined types were placed in this encounter.     No follow-ups on file.  There are no Patient Instructions on file for this visit.  This document serves as a record of services personally performed by Karie Chimera, MD, PhD. It was created on their behalf by De Blanch, an ophthalmic technician. The creation of this record is the provider's dictation and/or activities during the visit.    Electronically signed by: De Blanch, OA, 09/02/21  9:28 AM     Karie Chimera, M.D., Ph.D. Diseases & Surgery of the Retina and Vitreous [facility]  I have reviewed the above documentation for accuracy and completeness, and I agree with the above. Karie Chimera, M.D., Ph.D. 06/11/21 9:28 AM   Abbreviations: M myopia (nearsighted); A astigmatism; H hyperopia (farsighted); P presbyopia; Mrx spectacle prescription;  CTL contact lenses; OD right eye; OS left eye; OU both eyes  XT exotropia; ET esotropia; PEK punctate epithelial keratitis; PEE punctate epithelial erosions; DES dry eye syndrome; MGD meibomian gland dysfunction; ATs artificial tears; PFAT's preservative free artificial tears; NSC nuclear sclerotic cataract; PSC posterior subcapsular cataract; ERM epi-retinal membrane; PVD posterior vitreous detachment; RD retinal detachment; DM diabetes mellitus; DR diabetic retinopathy; NPDR non-proliferative diabetic retinopathy; PDR proliferative diabetic retinopathy; CSME clinically significant macular edema; DME diabetic macular edema; dbh dot blot hemorrhages; CWS cotton wool spot; POAG primary open angle glaucoma; C/D cup-to-disc ratio; HVF humphrey visual field; GVF goldmann visual field; OCT optical coherence tomography; IOP intraocular pressure; BRVO Branch retinal vein occlusion; CRVO central retinal vein occlusion; CRAO central retinal artery occlusion; BRAO branch retinal artery occlusion; RT retinal tear; SB scleral buckle; PPV pars plana  vitrectomy; VH Vitreous hemorrhage; PRP panretinal laser photocoagulation; IVK intravitreal kenalog; VMT vitreomacular traction; MH Macular hole;  NVD neovascularization of the disc; NVE neovascularization elsewhere; AREDS age related eye disease study; ARMD age related macular degeneration; POAG primary open angle glaucoma; EBMD epithelial/anterior basement membrane dystrophy; ACIOL anterior chamber intraocular lens; IOL intraocular lens; PCIOL posterior chamber intraocular lens; Phaco/IOL phacoemulsification with intraocular lens placement; PRK photorefractive keratectomy; LASIK laser assisted in situ keratomileusis; HTN hypertension; DM diabetes mellitus; COPD chronic obstructive pulmonary disease

## 2021-09-09 ENCOUNTER — Other Ambulatory Visit: Payer: Self-pay

## 2021-09-09 ENCOUNTER — Ambulatory Visit (INDEPENDENT_AMBULATORY_CARE_PROVIDER_SITE_OTHER): Payer: Medicare PPO | Admitting: Ophthalmology

## 2021-09-09 ENCOUNTER — Encounter (INDEPENDENT_AMBULATORY_CARE_PROVIDER_SITE_OTHER): Payer: Self-pay | Admitting: Ophthalmology

## 2021-09-09 DIAGNOSIS — H34831 Tributary (branch) retinal vein occlusion, right eye, with macular edema: Secondary | ICD-10-CM | POA: Diagnosis not present

## 2021-09-09 DIAGNOSIS — H25813 Combined forms of age-related cataract, bilateral: Secondary | ICD-10-CM | POA: Diagnosis not present

## 2021-09-09 DIAGNOSIS — H35033 Hypertensive retinopathy, bilateral: Secondary | ICD-10-CM | POA: Diagnosis not present

## 2021-09-09 DIAGNOSIS — I10 Essential (primary) hypertension: Secondary | ICD-10-CM | POA: Diagnosis not present

## 2021-09-09 MED ORDER — BEVACIZUMAB CHEMO INJECTION 1.25MG/0.05ML SYRINGE FOR KALEIDOSCOPE
1.2500 mg | INTRAVITREAL | Status: AC | PRN
  Administered 2021-09-09: 1.25 mg via INTRAVITREAL

## 2021-09-11 DIAGNOSIS — M25552 Pain in left hip: Secondary | ICD-10-CM | POA: Diagnosis not present

## 2021-09-14 DIAGNOSIS — M25512 Pain in left shoulder: Secondary | ICD-10-CM | POA: Diagnosis not present

## 2021-09-14 DIAGNOSIS — M545 Low back pain, unspecified: Secondary | ICD-10-CM | POA: Diagnosis not present

## 2021-09-14 DIAGNOSIS — R200 Anesthesia of skin: Secondary | ICD-10-CM | POA: Diagnosis not present

## 2021-09-16 DIAGNOSIS — M25552 Pain in left hip: Secondary | ICD-10-CM | POA: Diagnosis not present

## 2021-09-23 ENCOUNTER — Other Ambulatory Visit: Payer: Self-pay | Admitting: Internal Medicine

## 2021-09-23 DIAGNOSIS — F325 Major depressive disorder, single episode, in full remission: Secondary | ICD-10-CM

## 2021-09-23 DIAGNOSIS — M25552 Pain in left hip: Secondary | ICD-10-CM | POA: Diagnosis not present

## 2021-09-24 DIAGNOSIS — M545 Low back pain, unspecified: Secondary | ICD-10-CM | POA: Diagnosis not present

## 2021-09-24 DIAGNOSIS — M25512 Pain in left shoulder: Secondary | ICD-10-CM | POA: Diagnosis not present

## 2021-09-24 DIAGNOSIS — R200 Anesthesia of skin: Secondary | ICD-10-CM | POA: Diagnosis not present

## 2021-10-05 ENCOUNTER — Ambulatory Visit (HOSPITAL_BASED_OUTPATIENT_CLINIC_OR_DEPARTMENT_OTHER): Payer: Self-pay | Admitting: Orthopaedic Surgery

## 2021-10-05 ENCOUNTER — Ambulatory Visit (HOSPITAL_BASED_OUTPATIENT_CLINIC_OR_DEPARTMENT_OTHER): Payer: Medicare PPO | Admitting: Orthopaedic Surgery

## 2021-10-05 ENCOUNTER — Other Ambulatory Visit (HOSPITAL_BASED_OUTPATIENT_CLINIC_OR_DEPARTMENT_OTHER): Payer: Self-pay

## 2021-10-05 VITALS — Ht 66.0 in | Wt 146.0 lb

## 2021-10-05 DIAGNOSIS — M75111 Incomplete rotator cuff tear or rupture of right shoulder, not specified as traumatic: Secondary | ICD-10-CM

## 2021-10-05 MED ORDER — ACETAMINOPHEN 500 MG PO TABS
500.0000 mg | ORAL_TABLET | Freq: Three times a day (TID) | ORAL | 0 refills | Status: AC
  Filled 2021-10-05: qty 30, 10d supply, fill #0

## 2021-10-05 MED ORDER — IBUPROFEN 800 MG PO TABS
800.0000 mg | ORAL_TABLET | Freq: Three times a day (TID) | ORAL | 0 refills | Status: AC
  Filled 2021-10-05: qty 30, 10d supply, fill #0

## 2021-10-05 MED ORDER — ASPIRIN EC 325 MG PO TBEC
325.0000 mg | DELAYED_RELEASE_TABLET | Freq: Every day | ORAL | 0 refills | Status: AC
  Filled 2021-10-05: qty 30, 30d supply, fill #0

## 2021-10-05 MED ORDER — OXYCODONE HCL 5 MG PO TABS
5.0000 mg | ORAL_TABLET | ORAL | 0 refills | Status: AC | PRN
  Filled 2021-10-05: qty 20, 4d supply, fill #0

## 2021-10-05 NOTE — Progress Notes (Signed)
? ?                            ? ? ?Chief Complaint: Left shoulder pain  ? ? ?History of Present Illness:  ? ?10/05/2021: Presents today for ongoing follow-up.  At her last appointment, I did recommend a specific series of band exercises to help supplement her physical therapy which she has not been doing for several months.  Despite this she continues to have pain with overhead motion and weakness.  This is interfering with her ability to stay active particularly with overhead yoga poses. ? ?Emily Reid is a 76 y.o. female right-hand-dominant female with left shoulder pain who presents as a referral for ongoing management of her left shoulder.  She has previously had an injection in January which gave her significant relief.  She describes 1 out of 10 shoulder pain at this point.  She does have pain with lifting overhead.  This has been minimal after the injection.  She is completed 2 sessions of physical therapy.  She is very active and enjoys walking 5 to 6 miles a day.  She previously has tried massage therapy as well as dry needling with minimal relief.  She enjoys doing yoga and exercising with weights.  She states that she is limited in her ability to lift overhead weights or perform certain yoga poses that require overhead shoulder motion as this causes pain.  She is having pain when laying directly on the side at night. ? ? ? ?Surgical History:   ?None ? ?PMH/PSH/Family History/Social History/Meds/Allergies:   ? ?Past Medical History:  ?Diagnosis Date  ? Cataract   ? Mixed OU  ? Hyperlipidemia   ? Hypertension   ? Hypertensive retinopathy   ? OU  ? Urine incontinence   ? ?Past Surgical History:  ?Procedure Laterality Date  ? FACIAL COSMETIC SURGERY    ? TONSILLECTOMY  1957  ? ?Social History  ? ?Socioeconomic History  ? Marital status: Married  ?  Spouse name: Not on file  ? Number of children: Not on file  ? Years of education: Not on file  ? Highest education level: Not on file  ?Occupational History   ? Not on file  ?Tobacco Use  ? Smoking status: Never  ? Smokeless tobacco: Never  ?Vaping Use  ? Vaping Use: Never used  ?Substance and Sexual Activity  ? Alcohol use: Yes  ?  Alcohol/week: 7.0 standard drinks  ?  Types: 7 Glasses of wine per week  ? Drug use: No  ? Sexual activity: Yes  ?  Partners: Male  ?Other Topics Concern  ? Not on file  ?Social History Narrative  ? Not on file  ? ?Social Determinants of Health  ? ?Financial Resource Strain: Not on file  ?Food Insecurity: Not on file  ?Transportation Needs: Not on file  ?Physical Activity: Not on file  ?Stress: Not on file  ?Social Connections: Not on file  ? ?Family History  ?Problem Relation Age of Onset  ? Heart disease Mother   ? Hyperlipidemia Mother   ? Hypertension Mother   ? Stroke Mother   ? ?No Known Allergies ?Current Outpatient Medications  ?Medication Sig Dispense Refill  ? acetaminophen (TYLENOL) 500 MG tablet Take 1 tablet (500 mg total) by mouth every 8 (eight) hours for 10 days. 30 tablet 0  ? aspirin EC 325 MG tablet Take 1 tablet (325 mg total) by mouth daily.  30 tablet 0  ? ibuprofen (ADVIL) 800 MG tablet Take 1 tablet (800 mg total) by mouth every 8 (eight) hours for 10 days. Please take with food, please alternate with acetaminophen 30 tablet 0  ? oxyCODONE (OXY IR/ROXICODONE) 5 MG immediate release tablet Take 1 tablet (5 mg total) by mouth every 4 (four) hours as needed (severe pain). 20 tablet 0  ? amLODipine (NORVASC) 10 MG tablet Take 1 tablet (10 mg total) by mouth daily. 90 tablet 1  ? buPROPion (WELLBUTRIN XL) 150 MG 24 hr tablet Take 150 mg by mouth daily.    ? Coenzyme Q10 (COQ10) 100 MG CAPS Take by mouth daily.    ? ergocalciferol (VITAMIN D2) 1.25 MG (50000 UT) capsule Take by mouth.    ? LOSARTAN POTASSIUM PO Take 1 tablet by mouth daily.    ? mirtazapine (REMERON) 15 MG tablet TAKE 1 TABLET AT BEDTIME 90 tablet 1  ? Multiple Vitamin (MULTIVITAMIN) tablet Take 1 tablet by mouth daily.    ? Omega-3 Fatty Acids (FISH OIL PO)  Take 1 capsule by mouth 5 (five) times daily.    ? OVER THE COUNTER MEDICATION     ? OVER THE COUNTER MEDICATION     ? Phosphatidylserine 100 MG CAPS Take by mouth.    ? rosuvastatin (CRESTOR) 20 MG tablet Take 1 tablet (20 mg total) by mouth daily. 90 tablet 1  ? sertraline (ZOLOFT) 50 MG tablet Take 1 tablet (50 mg total) by mouth daily. 90 tablet 1  ? Turmeric (QC TUMERIC COMPLEX PO) Take 553 mg by mouth daily.    ? vitamin C (ASCORBIC ACID) 500 MG tablet Take 500 mg by mouth daily.    ? ?No current facility-administered medications for this visit.  ? ?No results found. ? ?Review of Systems:   ?A ROS was performed including pertinent positives and negatives as documented in the HPI. ? ?Physical Exam :   ?Constitutional: NAD and appears stated age ?Neurological: Alert and oriented ?Psych: Appropriate affect and cooperative ?Height 5\' 6"  (1.676 m), weight 146 lb (66.2 kg).  ? ?Comprehensive Musculoskeletal Exam:   ? ?Musculoskeletal Exam    ?Inspection Right Left  ?Skin No atrophy or winging No atrophy or winging  ?Palpation    ?Tenderness None Greater tuberosity, biceps  ?Range of Motion    ?Flexion (passive) 170 170  ?Flexion (active) 170 170  ?Abduction 170 170  ?ER at the side 70 70  ?Can reach behind back to T12 T12  ?Strength    ? Full 4/5 supraspinatus, negative belly press  ?Special Tests    ?Pseudoparalytic No No  ?Neurologic    ?Fires PIN, radial, median, ulnar, musculocutaneous, axillary, suprascapular, long thoracic, and spinal accessory innervated muscles. No abnormal sensibility  ?Vascular/Lymphatic    ?Radial Pulse 2+ 2+  ?Cervical Exam    ?Patient has symmetric cervical range of motion with negative Spurling's test.  ?Special Test: Positive speeds test  ? ? ? ?Imaging:   ?Xray (3 views left shoulder): ?Normal ? ?MRI (left shoulder): ?Partial-thickness supraspinatus rotator cuff tear which is involving approximately 90% of the footprint as well as biceps tearing in the bicipital groove.  She has  healthy appearing muscle belly of the rotator cuffs on T1 imaging. ? ?I personally reviewed and interpreted the radiographs. ? ? ?Assessment:   ?Very pleasant 76 year old female with left shoulder pain consistent with a partial rotator cuff tear.  I did describe the natural progression of rotator cuff tearing.  Unfortunately her  pain relief has been wearing off after a most recent subacromial injection in the left shoulder.  She has been working through her therapy program both with structured physical therapy and with a structured series of band exercises which I provided her.  At this time, she continues to be limited with overhead activity which has changed her ability to exercise as well as participate in yoga.  She is overall hoping to not be limited in this regard.  She has trialed an injection and a shoulder rotator cuff strengthening program now for several months with limited relief.  I did discuss additional options including operative versus nonoperative.  We did discuss that should she elect for nonoperative management, PRP would be 1 additional treatment option.  We did also discuss that given that she has failed multiple nonoperative modalities, I do believe that she would be a candidate for rotator cuff debridement and repair given the fact that approximately 90% of the footprint is involved.  I did also discuss the role for a biceps tenodesis given her MRI findings and physical exam findings.  After discussion of her remaining options, she ultimately elected for shoulder arthroscopy with rotator cuff repair and biceps tenodesis ?Plan :   ? ?-Plan for left shoulder arthroscopy with rotator cuff repair and biceps tenodesis ? ? ? ?After a lengthy discussion of treatment options, including risks, benefits, alternatives, complications of surgical and nonsurgical conservative options, the patient elected surgical repair.  ? ?The patient  is aware of the material risks  and complications including, but not  limited to injury to adjacent structures, neurovascular injury, infection, numbness, bleeding, implant failure, thermal burns, stiffness, persistent pain, failure to heal, disease transmission from allog

## 2021-10-06 DIAGNOSIS — R200 Anesthesia of skin: Secondary | ICD-10-CM | POA: Diagnosis not present

## 2021-10-06 DIAGNOSIS — M545 Low back pain, unspecified: Secondary | ICD-10-CM | POA: Diagnosis not present

## 2021-10-06 DIAGNOSIS — M25512 Pain in left shoulder: Secondary | ICD-10-CM | POA: Diagnosis not present

## 2021-10-14 DIAGNOSIS — M25512 Pain in left shoulder: Secondary | ICD-10-CM | POA: Diagnosis not present

## 2021-10-14 DIAGNOSIS — R200 Anesthesia of skin: Secondary | ICD-10-CM | POA: Diagnosis not present

## 2021-10-14 DIAGNOSIS — M545 Low back pain, unspecified: Secondary | ICD-10-CM | POA: Diagnosis not present

## 2021-10-17 ENCOUNTER — Other Ambulatory Visit: Payer: Self-pay | Admitting: Internal Medicine

## 2021-10-17 DIAGNOSIS — E785 Hyperlipidemia, unspecified: Secondary | ICD-10-CM

## 2021-10-17 DIAGNOSIS — F325 Major depressive disorder, single episode, in full remission: Secondary | ICD-10-CM

## 2021-10-21 DIAGNOSIS — M25512 Pain in left shoulder: Secondary | ICD-10-CM | POA: Diagnosis not present

## 2021-10-21 DIAGNOSIS — M545 Low back pain, unspecified: Secondary | ICD-10-CM | POA: Diagnosis not present

## 2021-10-21 DIAGNOSIS — R200 Anesthesia of skin: Secondary | ICD-10-CM | POA: Diagnosis not present

## 2021-10-22 DIAGNOSIS — M25552 Pain in left hip: Secondary | ICD-10-CM | POA: Diagnosis not present

## 2021-10-23 ENCOUNTER — Telehealth: Payer: Self-pay | Admitting: Internal Medicine

## 2021-10-28 ENCOUNTER — Ambulatory Visit: Payer: Medicare PPO | Admitting: Internal Medicine

## 2021-11-03 DIAGNOSIS — E785 Hyperlipidemia, unspecified: Secondary | ICD-10-CM | POA: Diagnosis not present

## 2021-11-03 DIAGNOSIS — F419 Anxiety disorder, unspecified: Secondary | ICD-10-CM | POA: Diagnosis not present

## 2021-11-03 DIAGNOSIS — I10 Essential (primary) hypertension: Secondary | ICD-10-CM | POA: Diagnosis not present

## 2021-11-03 DIAGNOSIS — Z780 Asymptomatic menopausal state: Secondary | ICD-10-CM | POA: Diagnosis not present

## 2021-11-04 DIAGNOSIS — M25512 Pain in left shoulder: Secondary | ICD-10-CM | POA: Diagnosis not present

## 2021-11-04 DIAGNOSIS — R200 Anesthesia of skin: Secondary | ICD-10-CM | POA: Diagnosis not present

## 2021-11-04 DIAGNOSIS — M545 Low back pain, unspecified: Secondary | ICD-10-CM | POA: Diagnosis not present

## 2021-11-09 ENCOUNTER — Encounter (HOSPITAL_BASED_OUTPATIENT_CLINIC_OR_DEPARTMENT_OTHER): Payer: Self-pay

## 2021-11-09 ENCOUNTER — Ambulatory Visit (HOSPITAL_BASED_OUTPATIENT_CLINIC_OR_DEPARTMENT_OTHER): Payer: Medicare PPO | Admitting: Physical Therapy

## 2021-11-11 DIAGNOSIS — Z1331 Encounter for screening for depression: Secondary | ICD-10-CM | POA: Diagnosis not present

## 2021-11-11 DIAGNOSIS — Z23 Encounter for immunization: Secondary | ICD-10-CM | POA: Diagnosis not present

## 2021-11-11 DIAGNOSIS — E785 Hyperlipidemia, unspecified: Secondary | ICD-10-CM | POA: Diagnosis not present

## 2021-11-11 DIAGNOSIS — N1831 Chronic kidney disease, stage 3a: Secondary | ICD-10-CM | POA: Diagnosis not present

## 2021-11-11 DIAGNOSIS — D72819 Decreased white blood cell count, unspecified: Secondary | ICD-10-CM | POA: Diagnosis not present

## 2021-11-11 DIAGNOSIS — I10 Essential (primary) hypertension: Secondary | ICD-10-CM | POA: Diagnosis not present

## 2021-11-11 DIAGNOSIS — Z1339 Encounter for screening examination for other mental health and behavioral disorders: Secondary | ICD-10-CM | POA: Diagnosis not present

## 2021-11-11 DIAGNOSIS — M25512 Pain in left shoulder: Secondary | ICD-10-CM | POA: Diagnosis not present

## 2021-11-11 DIAGNOSIS — Z0000 Encounter for general adult medical examination without abnormal findings: Secondary | ICD-10-CM | POA: Diagnosis not present

## 2021-11-13 ENCOUNTER — Other Ambulatory Visit: Payer: Self-pay

## 2021-11-13 ENCOUNTER — Encounter (HOSPITAL_BASED_OUTPATIENT_CLINIC_OR_DEPARTMENT_OTHER): Payer: Self-pay | Admitting: Orthopaedic Surgery

## 2021-11-14 ENCOUNTER — Other Ambulatory Visit: Payer: Self-pay | Admitting: Internal Medicine

## 2021-11-14 DIAGNOSIS — I10 Essential (primary) hypertension: Secondary | ICD-10-CM

## 2021-11-16 DIAGNOSIS — R200 Anesthesia of skin: Secondary | ICD-10-CM | POA: Diagnosis not present

## 2021-11-16 DIAGNOSIS — M25512 Pain in left shoulder: Secondary | ICD-10-CM | POA: Diagnosis not present

## 2021-11-16 DIAGNOSIS — M545 Low back pain, unspecified: Secondary | ICD-10-CM | POA: Diagnosis not present

## 2021-11-17 ENCOUNTER — Other Ambulatory Visit: Payer: Self-pay

## 2021-11-17 ENCOUNTER — Encounter (HOSPITAL_BASED_OUTPATIENT_CLINIC_OR_DEPARTMENT_OTHER)
Admission: RE | Disposition: A | Discharge status: Home / Self Care | Payer: Self-pay | Source: Home / Self Care | Attending: Orthopaedic Surgery

## 2021-11-17 ENCOUNTER — Encounter (HOSPITAL_BASED_OUTPATIENT_CLINIC_OR_DEPARTMENT_OTHER): Payer: Self-pay | Admitting: Orthopaedic Surgery

## 2021-11-17 ENCOUNTER — Ambulatory Visit (HOSPITAL_BASED_OUTPATIENT_CLINIC_OR_DEPARTMENT_OTHER)
Admission: RE | Admit: 2021-11-17 | Discharge: 2021-11-17 | Disposition: A | Discharge status: Home / Self Care | Payer: Medicare PPO | Attending: Orthopaedic Surgery | Admitting: Orthopaedic Surgery

## 2021-11-17 ENCOUNTER — Ambulatory Visit (HOSPITAL_BASED_OUTPATIENT_CLINIC_OR_DEPARTMENT_OTHER): Payer: Medicare PPO | Admitting: Anesthesiology

## 2021-11-17 DIAGNOSIS — F32A Depression, unspecified: Secondary | ICD-10-CM | POA: Diagnosis not present

## 2021-11-17 DIAGNOSIS — M75122 Complete rotator cuff tear or rupture of left shoulder, not specified as traumatic: Secondary | ICD-10-CM

## 2021-11-17 DIAGNOSIS — M7522 Bicipital tendinitis, left shoulder: Secondary | ICD-10-CM | POA: Insufficient documentation

## 2021-11-17 DIAGNOSIS — M67814 Other specified disorders of tendon, left shoulder: Secondary | ICD-10-CM | POA: Diagnosis not present

## 2021-11-17 DIAGNOSIS — F419 Anxiety disorder, unspecified: Secondary | ICD-10-CM | POA: Insufficient documentation

## 2021-11-17 DIAGNOSIS — Z01818 Encounter for other preprocedural examination: Secondary | ICD-10-CM

## 2021-11-17 DIAGNOSIS — M75112 Incomplete rotator cuff tear or rupture of left shoulder, not specified as traumatic: Secondary | ICD-10-CM | POA: Diagnosis not present

## 2021-11-17 DIAGNOSIS — M75102 Unspecified rotator cuff tear or rupture of left shoulder, not specified as traumatic: Secondary | ICD-10-CM

## 2021-11-17 DIAGNOSIS — Z79899 Other long term (current) drug therapy: Secondary | ICD-10-CM | POA: Diagnosis not present

## 2021-11-17 DIAGNOSIS — G8918 Other acute postprocedural pain: Secondary | ICD-10-CM | POA: Diagnosis not present

## 2021-11-17 DIAGNOSIS — I10 Essential (primary) hypertension: Secondary | ICD-10-CM | POA: Insufficient documentation

## 2021-11-17 DIAGNOSIS — M75111 Incomplete rotator cuff tear or rupture of right shoulder, not specified as traumatic: Secondary | ICD-10-CM

## 2021-11-17 HISTORY — DX: Depression, unspecified: F32.A

## 2021-11-17 HISTORY — PX: SHOULDER ARTHROSCOPY WITH SUBACROMIAL DECOMPRESSION, ROTATOR CUFF REPAIR AND BICEP TENDON REPAIR: SHX5687

## 2021-11-17 HISTORY — DX: Anxiety disorder, unspecified: F41.9

## 2021-11-17 HISTORY — DX: Unspecified rotator cuff tear or rupture of left shoulder, not specified as traumatic: M75.102

## 2021-11-17 SURGERY — SHOULDER ARTHROSCOPY WITH SUBACROMIAL DECOMPRESSION, ROTATOR CUFF REPAIR AND BICEP TENDON REPAIR
Anesthesia: General | Site: Shoulder | Laterality: Left

## 2021-11-17 MED ORDER — BUPIVACAINE-EPINEPHRINE (PF) 0.5% -1:200000 IJ SOLN
INTRAMUSCULAR | Status: AC
  Filled 2021-11-17: qty 30

## 2021-11-17 MED ORDER — PHENYLEPHRINE HCL (PRESSORS) 10 MG/ML IV SOLN
INTRAVENOUS | Status: DC | PRN
  Administered 2021-11-17 (×2): 80 ug via INTRAVENOUS
  Administered 2021-11-17: 160 ug via INTRAVENOUS
  Administered 2021-11-17: 80 ug via INTRAVENOUS
  Administered 2021-11-17 (×2): 160 ug via INTRAVENOUS
  Administered 2021-11-17 (×2): 80 ug via INTRAVENOUS
  Administered 2021-11-17: 160 ug via INTRAVENOUS
  Administered 2021-11-17 (×3): 80 ug via INTRAVENOUS
  Administered 2021-11-17 (×2): 160 ug via INTRAVENOUS

## 2021-11-17 MED ORDER — PROPOFOL 10 MG/ML IV BOLUS
INTRAVENOUS | Status: AC
  Filled 2021-11-17: qty 20

## 2021-11-17 MED ORDER — ACETAMINOPHEN 500 MG PO TABS
1000.0000 mg | ORAL_TABLET | Freq: Once | ORAL | Status: AC
  Administered 2021-11-17: 1000 mg via ORAL

## 2021-11-17 MED ORDER — HYDROMORPHONE HCL 1 MG/ML IJ SOLN: 0.2500 mg | INTRAMUSCULAR | Status: DC | PRN

## 2021-11-17 MED ORDER — FENTANYL CITRATE (PF) 100 MCG/2ML IJ SOLN
100.0000 ug | Freq: Once | INTRAMUSCULAR | Status: AC
  Administered 2021-11-17: 100 ug via INTRAVENOUS

## 2021-11-17 MED ORDER — ACETAMINOPHEN 500 MG PO TABS
ORAL_TABLET | ORAL | Status: AC
  Filled 2021-11-17: qty 2

## 2021-11-17 MED ORDER — SODIUM CHLORIDE 0.9 % IR SOLN
Status: DC | PRN
  Administered 2021-11-17: 6000 mL

## 2021-11-17 MED ORDER — PROPOFOL 10 MG/ML IV BOLUS
INTRAVENOUS | Status: DC | PRN
  Administered 2021-11-17: 200 mg via INTRAVENOUS

## 2021-11-17 MED ORDER — OXYCODONE HCL 5 MG PO TABS: 5.0000 mg | ORAL_TABLET | Freq: Once | ORAL | Status: DC | PRN

## 2021-11-17 MED ORDER — PROMETHAZINE HCL 25 MG/ML IJ SOLN: 6.2500 mg | INTRAMUSCULAR | Status: DC | PRN

## 2021-11-17 MED ORDER — DEXAMETHASONE SODIUM PHOSPHATE 10 MG/ML IJ SOLN
INTRAMUSCULAR | Status: AC
  Filled 2021-11-17: qty 1

## 2021-11-17 MED ORDER — PHENYLEPHRINE HCL-NACL 20-0.9 MG/250ML-% IV SOLN
INTRAVENOUS | Status: DC | PRN
  Administered 2021-11-17: 40 ug/min via INTRAVENOUS

## 2021-11-17 MED ORDER — PHENYLEPHRINE 80 MCG/ML (10ML) SYRINGE FOR IV PUSH (FOR BLOOD PRESSURE SUPPORT)
PREFILLED_SYRINGE | INTRAVENOUS | Status: AC
  Filled 2021-11-17: qty 10

## 2021-11-17 MED ORDER — GABAPENTIN 300 MG PO CAPS
300.0000 mg | ORAL_CAPSULE | Freq: Once | ORAL | Status: AC
  Administered 2021-11-17: 300 mg via ORAL

## 2021-11-17 MED ORDER — TRANEXAMIC ACID-NACL 1000-0.7 MG/100ML-% IV SOLN
INTRAVENOUS | Status: AC
  Filled 2021-11-17: qty 100

## 2021-11-17 MED ORDER — TRANEXAMIC ACID-NACL 1000-0.7 MG/100ML-% IV SOLN
1000.0000 mg | INTRAVENOUS | Status: AC
  Administered 2021-11-17: 1000 mg via INTRAVENOUS

## 2021-11-17 MED ORDER — GABAPENTIN 300 MG PO CAPS
ORAL_CAPSULE | ORAL | Status: AC
  Filled 2021-11-17: qty 1

## 2021-11-17 MED ORDER — AMISULPRIDE (ANTIEMETIC) 5 MG/2ML IV SOLN: 10.0000 mg | Freq: Once | INTRAVENOUS | Status: DC | PRN

## 2021-11-17 MED ORDER — CEFAZOLIN SODIUM-DEXTROSE 2-4 GM/100ML-% IV SOLN
2.0000 g | INTRAVENOUS | Status: AC
  Administered 2021-11-17: 2 g via INTRAVENOUS

## 2021-11-17 MED ORDER — BUPIVACAINE HCL (PF) 0.5 % IJ SOLN
INTRAMUSCULAR | Status: DC | PRN
  Administered 2021-11-17: 20 mL via PERINEURAL

## 2021-11-17 MED ORDER — OXYCODONE HCL 5 MG/5ML PO SOLN: 5.0000 mg | Freq: Once | ORAL | Status: DC | PRN

## 2021-11-17 MED ORDER — DEXAMETHASONE SODIUM PHOSPHATE 4 MG/ML IJ SOLN
INTRAMUSCULAR | Status: DC | PRN
  Administered 2021-11-17: 8 mg via INTRAVENOUS

## 2021-11-17 MED ORDER — LIDOCAINE 2% (20 MG/ML) 5 ML SYRINGE
INTRAMUSCULAR | Status: AC
  Filled 2021-11-17: qty 5

## 2021-11-17 MED ORDER — ONDANSETRON HCL 4 MG/2ML IJ SOLN
INTRAMUSCULAR | Status: DC | PRN
  Administered 2021-11-17: 4 mg via INTRAVENOUS

## 2021-11-17 MED ORDER — EPHEDRINE 5 MG/ML INJ
INTRAVENOUS | Status: AC
  Filled 2021-11-17: qty 5

## 2021-11-17 MED ORDER — BUPIVACAINE LIPOSOME 1.3 % IJ SUSP
INTRAMUSCULAR | Status: DC | PRN
  Administered 2021-11-17: 10 mL via PERINEURAL

## 2021-11-17 MED ORDER — PHENYLEPHRINE HCL (PRESSORS) 10 MG/ML IV SOLN
INTRAVENOUS | Status: AC
Start: 2021-11-17 — End: ?
  Filled 2021-11-17: qty 1

## 2021-11-17 MED ORDER — SODIUM CHLORIDE 0.9 % IR SOLN
Status: DC | PRN
  Administered 2021-11-17: 9000 mL

## 2021-11-17 MED ORDER — EPINEPHRINE PF 1 MG/ML IJ SOLN
INTRAMUSCULAR | Status: AC
  Filled 2021-11-17: qty 4

## 2021-11-17 MED ORDER — MIDAZOLAM HCL 2 MG/2ML IJ SOLN
2.0000 mg | Freq: Once | INTRAMUSCULAR | Status: AC
  Administered 2021-11-17: 1 mg via INTRAVENOUS

## 2021-11-17 MED ORDER — FENTANYL CITRATE (PF) 100 MCG/2ML IJ SOLN
INTRAMUSCULAR | Status: AC
  Filled 2021-11-17: qty 2

## 2021-11-17 MED ORDER — MIDAZOLAM HCL 2 MG/2ML IJ SOLN
INTRAMUSCULAR | Status: AC
  Filled 2021-11-17: qty 2

## 2021-11-17 MED ORDER — ONDANSETRON HCL 4 MG/2ML IJ SOLN
INTRAMUSCULAR | Status: AC
  Filled 2021-11-17: qty 2

## 2021-11-17 MED ORDER — EPHEDRINE SULFATE (PRESSORS) 50 MG/ML IJ SOLN
INTRAMUSCULAR | Status: DC | PRN
  Administered 2021-11-17 (×2): 10 mg via INTRAVENOUS
  Administered 2021-11-17: 5 mg via INTRAVENOUS
  Administered 2021-11-17 (×2): 10 mg via INTRAVENOUS
  Administered 2021-11-17: 5 mg via INTRAVENOUS

## 2021-11-17 MED ORDER — LACTATED RINGERS IV SOLN: INTRAVENOUS | Status: DC

## 2021-11-17 MED ORDER — LIDOCAINE 2% (20 MG/ML) 5 ML SYRINGE
INTRAMUSCULAR | Status: DC | PRN
  Administered 2021-11-17: 40 mg via INTRAVENOUS

## 2021-11-17 MED ORDER — CEFAZOLIN SODIUM-DEXTROSE 2-4 GM/100ML-% IV SOLN
INTRAVENOUS | Status: AC
  Filled 2021-11-17: qty 100

## 2021-11-17 SURGICAL SUPPLY — 75 items
AID PSTN UNV HD RSTRNT DISP (MISCELLANEOUS) ×2
ANCH SUT 2 1.5X2.9 2 LD (Anchor) ×2 IMPLANT
ANCH SUT KNTLS STRL SHLDR SYS (Anchor) ×2 IMPLANT
ANCHOR JUGGERKNOT 2X2.9 WH/BL (Anchor) ×2 IMPLANT
ANCHOR SUT QUATTRO KNTLS 4.5 (Anchor) ×2 IMPLANT
APL PRP STRL LF DISP 70% ISPRP (MISCELLANEOUS) ×2
BLADE EXCALIBUR 4.0X13 (MISCELLANEOUS) ×3 IMPLANT
BURR OVAL 8 FLU 4.0X13 (MISCELLANEOUS) ×2 IMPLANT
CANNULA 5.75X71 LONG (CANNULA) ×2 IMPLANT
CANNULA 7X7 TWIST-IN (CANNULA) IMPLANT
CANNULA PASSPORT 5 (CANNULA) IMPLANT
CANNULA PASSPORT BUTTON 10-40 (CANNULA) IMPLANT
CANNULA TWIST IN 8.25X7CM (CANNULA) ×2 IMPLANT
CHLORAPREP W/TINT 26 (MISCELLANEOUS) ×4 IMPLANT
COOLER ICEMAN CLASSIC (MISCELLANEOUS) ×3 IMPLANT
DRAPE IMP U-DRAPE 54X76 (DRAPES) ×7 IMPLANT
DRAPE INCISE IOBAN 66X45 STRL (DRAPES) ×3 IMPLANT
DRAPE SHOULDER BEACH CHAIR (DRAPES) ×3 IMPLANT
DRAPE U-SHAPE 47X51 STRL (DRAPES) ×2 IMPLANT
DRSG PAD ABDOMINAL 8X10 ST (GAUZE/BANDAGES/DRESSINGS) ×3 IMPLANT
DW OUTFLOW CASSETTE/TUBE SET (MISCELLANEOUS) ×3 IMPLANT
GAUZE SPONGE 4X4 12PLY STRL (GAUZE/BANDAGES/DRESSINGS) ×3 IMPLANT
GAUZE XEROFORM 1X8 LF (GAUZE/BANDAGES/DRESSINGS) ×3 IMPLANT
GLOVE BIO SURGEON STRL SZ 6 (GLOVE) ×6 IMPLANT
GLOVE BIO SURGEON STRL SZ7.5 (GLOVE) ×3 IMPLANT
GLOVE BIOGEL PI IND STRL 6.5 (GLOVE) ×2 IMPLANT
GLOVE BIOGEL PI IND STRL 7.0 (GLOVE) ×4 IMPLANT
GLOVE BIOGEL PI IND STRL 8 (GLOVE) ×2 IMPLANT
GLOVE BIOGEL PI INDICATOR 6.5 (GLOVE) ×1
GLOVE BIOGEL PI INDICATOR 7.0 (GLOVE) ×4
GLOVE BIOGEL PI INDICATOR 8 (GLOVE) ×1
GLOVE ECLIPSE 8.0 STRL XLNG CF (GLOVE) ×1 IMPLANT
GLOVE SURG SS PI 7.0 STRL IVOR (GLOVE) ×2 IMPLANT
GLOVE SURG SYN 7.5  E (GLOVE)
GLOVE SURG SYN 7.5 E (GLOVE) IMPLANT
GLOVE SURG SYN 7.5 PF PI (GLOVE) ×1 IMPLANT
GOWN STRL REUS W/ TWL LRG LVL3 (GOWN DISPOSABLE) ×5 IMPLANT
GOWN STRL REUS W/ TWL XL LVL3 (GOWN DISPOSABLE) ×2 IMPLANT
GOWN STRL REUS W/TWL LRG LVL3 (GOWN DISPOSABLE) ×9
GOWN STRL REUS W/TWL XL LVL3 (GOWN DISPOSABLE) ×6 IMPLANT
KIT SHOULDER STAB MARCO (KITS) ×3 IMPLANT
KIT STR SPEAR 1.8 FBRTK DISP (KITS) IMPLANT
LASSO 90 CVE QUICKPAS (DISPOSABLE) IMPLANT
LASSO CRESCENT QUICKPASS (SUTURE) IMPLANT
MANIFOLD NEPTUNE II (INSTRUMENTS) ×5 IMPLANT
NDL SAFETY ECLIPSE 18X1.5 (NEEDLE) ×2 IMPLANT
NDL SCORPION MULTI FIRE (NEEDLE) IMPLANT
NDL SUT PASSER RTC (NEEDLE) IMPLANT
NEEDLE HYPO 18GX1.5 SHARP (NEEDLE) ×3
NEEDLE SCORPION MULTI FIRE (NEEDLE) IMPLANT
NEEDLE SUT PASSER RTC (NEEDLE) ×3 IMPLANT
PACK ARTHROSCOPY DSU (CUSTOM PROCEDURE TRAY) ×3 IMPLANT
PACK BASIN DAY SURGERY FS (CUSTOM PROCEDURE TRAY) ×3 IMPLANT
PAD COLD SHLDR WRAP-ON (PAD) ×3 IMPLANT
PORT APPOLLO RF 90DEGREE MULTI (SURGICAL WAND) ×3 IMPLANT
RESTRAINT HEAD UNIVERSAL NS (MISCELLANEOUS) ×3 IMPLANT
SHEET MEDIUM DRAPE 40X70 STRL (DRAPES) ×2 IMPLANT
SLEEVE SCD COMPRESS KNEE MED (STOCKING) ×3 IMPLANT
SLING ULTRA III MED (ORTHOPEDIC SUPPLIES) ×2 IMPLANT
SUT BROADBAND TAPE 2PK 1.5 (SUTURE) ×2 IMPLANT
SUT ETHILON 3 0 PS 1 (SUTURE) ×3 IMPLANT
SUT FIBERWIRE #2 38 T-5 BLUE (SUTURE)
SUT PDS AB 1 CT  36 (SUTURE)
SUT PDS AB 1 CT 36 (SUTURE) IMPLANT
SUT TIGER TAPE 7 IN WHITE (SUTURE) IMPLANT
SUTURE FIBERWR #2 38 T-5 BLUE (SUTURE) IMPLANT
SUTURE TAPE 1.3 40 TPR END (SUTURE) IMPLANT
SUTURE TAPE TIGERLINK 1.3MM BL (SUTURE) IMPLANT
SUTURETAPE 1.3 40 TPR END (SUTURE)
SUTURETAPE TIGERLINK 1.3MM BL (SUTURE)
SYR 5ML LL (SYRINGE) ×3 IMPLANT
TAPE FIBER 2MM 7IN #2 BLUE (SUTURE) IMPLANT
TOWEL GREEN STERILE FF (TOWEL DISPOSABLE) ×4 IMPLANT
TUBE CONNECTING 20X1/4 (TUBING) ×3 IMPLANT
TUBING ARTHROSCOPY IRRIG 16FT (MISCELLANEOUS) ×3 IMPLANT

## 2021-11-17 NOTE — H&P (Signed)
Chief Complaint: Left shoulder pain      History of Present Illness:    10/05/2021: Presents today for ongoing follow-up.  At her last appointment, I did recommend a specific series of band exercises to help supplement her physical therapy which she has not been doing for several months.  Despite this she continues to have pain with overhead motion and weakness.  This is interfering with her ability to stay active particularly with overhead yoga poses.   Emily Reid is a 76 y.o. female right-hand-dominant female with left shoulder pain who presents as a referral for ongoing management of her left shoulder.  She has previously had an injection in January which gave her significant relief.  She describes 1 out of 10 shoulder pain at this point.  She does have pain with lifting overhead.  This has been minimal after the injection.  She is completed 2 sessions of physical therapy.  She is very active and enjoys walking 5 to 6 miles a day.  She previously has tried massage therapy as well as dry needling with minimal relief.  She enjoys doing yoga and exercising with weights.  She states that she is limited in her ability to lift overhead weights or perform certain yoga poses that require overhead shoulder motion as this causes pain.  She is having pain when laying directly on the side at night.       Surgical History:   None   PMH/PSH/Family History/Social History/Meds/Allergies:         Past Medical History:  Diagnosis Date   Cataract      Mixed OU   Hyperlipidemia     Hypertension     Hypertensive retinopathy      OU   Urine incontinence           Past Surgical History:  Procedure Laterality Date   FACIAL COSMETIC SURGERY       TONSILLECTOMY   1957    Social History         Socioeconomic History   Marital status: Married      Spouse name: Not on file   Number of children: Not on file   Years of education: Not on file   Highest education level: Not on file   Occupational History   Not on file  Tobacco Use   Smoking status: Never   Smokeless tobacco: Never  Vaping Use   Vaping Use: Never used  Substance and Sexual Activity   Alcohol use: Yes      Alcohol/week: 7.0 standard drinks      Types: 7 Glasses of wine per week   Drug use: No   Sexual activity: Yes      Partners: Male  Other Topics Concern   Not on file  Social History Narrative   Not on file    Social Determinants of Health    Financial Resource Strain: Not on file  Food Insecurity: Not on file  Transportation Needs: Not on file  Physical Activity: Not on file  Stress: Not on file  Social Connections: Not on file         Family History  Problem Relation Age of Onset   Heart disease Mother     Hyperlipidemia Mother     Hypertension Mother     Stroke Mother      No Known Allergies       Current Outpatient Medications  Medication Sig Dispense Refill   acetaminophen (TYLENOL) 500 MG tablet Take 1 tablet (500 mg total) by mouth every 8 (eight) hours for 10 days. 30 tablet 0   aspirin EC 325 MG tablet Take 1 tablet (325 mg total) by mouth daily. 30 tablet 0   ibuprofen (ADVIL) 800 MG tablet Take 1 tablet (800 mg total) by mouth every 8 (eight) hours for 10 days. Please take with food, please alternate with acetaminophen 30 tablet 0   oxyCODONE (OXY IR/ROXICODONE) 5 MG immediate release tablet Take 1 tablet (5 mg total) by mouth every 4 (four) hours as needed (severe pain). 20 tablet 0   amLODipine (NORVASC) 10 MG tablet Take 1 tablet (10 mg total) by mouth daily. 90 tablet 1   buPROPion (WELLBUTRIN XL) 150 MG 24 hr tablet Take 150 mg by mouth daily.       Coenzyme Q10 (COQ10) 100 MG CAPS Take by mouth daily.       ergocalciferol (VITAMIN D2) 1.25 MG (50000 UT) capsule Take by mouth.       LOSARTAN POTASSIUM PO Take 1 tablet by mouth daily.       mirtazapine (REMERON) 15 MG tablet TAKE 1 TABLET AT BEDTIME 90 tablet 1   Multiple Vitamin (MULTIVITAMIN) tablet Take 1  tablet by mouth daily.       Omega-3 Fatty Acids (FISH OIL PO) Take 1 capsule by mouth 5 (five) times daily.       OVER THE COUNTER MEDICATION         OVER THE COUNTER MEDICATION         Phosphatidylserine 100 MG CAPS Take by mouth.       rosuvastatin (CRESTOR) 20 MG tablet Take 1 tablet (20 mg total) by mouth daily. 90 tablet 1   sertraline (ZOLOFT) 50 MG tablet Take 1 tablet (50 mg total) by mouth daily. 90 tablet 1   Turmeric (QC TUMERIC COMPLEX PO) Take 553 mg by mouth daily.       vitamin C (ASCORBIC ACID) 500 MG tablet Take 500 mg by mouth daily.        No current facility-administered medications for this visit.    Imaging Results (Last 48 hours)  No results found.     Review of Systems:   A ROS was performed including pertinent positives and negatives as documented in the HPI.   Physical Exam :   Constitutional: NAD and appears stated age Neurological: Alert and oriented Psych: Appropriate affect and cooperative Height 5\' 6"  (1.676 m), weight 146 lb (66.2 kg).    Comprehensive Musculoskeletal Exam:     Musculoskeletal Exam      Inspection Right Left  Skin No atrophy or winging No atrophy or winging  Palpation      Tenderness None Greater tuberosity, biceps  Range of Motion      Flexion (passive) 170 170  Flexion (active) 170 170  Abduction 170 170  ER at the side 70 70  Can reach behind back to T12 T12  Strength        Full 4/5 supraspinatus, negative belly press  Special Tests      Pseudoparalytic No No  Neurologic      Fires PIN, radial, median, ulnar, musculocutaneous, axillary, suprascapular, long thoracic, and spinal accessory innervated muscles. No abnormal sensibility  Vascular/Lymphatic      Radial Pulse 2+ 2+  Cervical Exam      Patient has symmetric cervical range of motion with negative Spurling's test.  Special  Test: Positive speeds test        Imaging:   Xray (3 views left shoulder): Normal   MRI (left shoulder): Partial-thickness  supraspinatus rotator cuff tear which is involving approximately 90% of the footprint as well as biceps tearing in the bicipital groove.  She has healthy appearing muscle belly of the rotator cuffs on T1 imaging.   I personally reviewed and interpreted the radiographs.     Assessment:   Very pleasant 76 year old female with left shoulder pain consistent with a partial rotator cuff tear.  I did describe the natural progression of rotator cuff tearing.  Unfortunately her pain relief has been wearing off after a most recent subacromial injection in the left shoulder.  She has been working through her therapy program both with structured physical therapy and with a structured series of band exercises which I provided her.  At this time, she continues to be limited with overhead activity which has changed her ability to exercise as well as participate in yoga.  She is overall hoping to not be limited in this regard.  She has trialed an injection and a shoulder rotator cuff strengthening program now for several months with limited relief.  I did discuss additional options including operative versus nonoperative.  We did discuss that should she elect for nonoperative management, PRP would be 1 additional treatment option.  We did also discuss that given that she has failed multiple nonoperative modalities, I do believe that she would be a candidate for rotator cuff debridement and repair given the fact that approximately 90% of the footprint is involved.  I did also discuss the role for a biceps tenodesis given her MRI findings and physical exam findings.  After discussion of her remaining options, she ultimately elected for shoulder arthroscopy with rotator cuff repair and biceps tenodesis Plan :     -Plan for left shoulder arthroscopy with rotator cuff repair and biceps tenodesis       After a lengthy discussion of treatment options, including risks, benefits, alternatives, complications of surgical and  nonsurgical conservative options, the patient elected surgical repair.    The patient  is aware of the material risks  and complications including, but not limited to injury to adjacent structures, neurovascular injury, infection, numbness, bleeding, implant failure, thermal burns, stiffness, persistent pain, failure to heal, disease transmission from allograft, need for further surgery, dislocation, anesthetic risks, blood clots, risks of death,and others. The probabilities of surgical success and failure discussed with patient given their particular co-morbidities.The time and nature of expected rehabilitation and recovery was discussed.The patient's questions were all answered preoperatively.  No barriers to understanding were noted. I explained the natural history of the disease process and Rx rationale.  I explained to the patient what I considered to be reasonable expectations given their personal situation.  The final treatment plan was arrived at through a shared patient decision making process model.   Patient was prescribed a shoulder immobilizer today for the diagnosis listed above under assessment. The patient requires stabilization from this orthosis in their left arm.     I personally saw and evaluated the patient, and participated in the management and treatment plan.   Huel CoteSteven Miangel Flom, MD Attending Physician, Orthopedic Surgery   This document was dictated using Dragon voice recognition software. A reasonable attempt at proof

## 2021-11-17 NOTE — Op Note (Signed)
Date of Surgery: 11/17/2021  INDICATIONS: Emily Reid is a 76 y.o.-year-old female with left shoulder rotator cuff tear which has failed conservative management .  The risk and benefits of the procedure were discussed in detail and documented in the pre-operative evaluation.   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  POSTOPERATIVE DIAGNOSIS: Same.  PROCEDURE: Arthroscopic limited debridement - 29518 Arthroscopic subacromial decompression - 84166 Arthroscopic rotator cuff repair - 06301 Arthroscopic biceps tenodesis - 60109  SURGEON: Benancio Deeds MD  ASSISTANT: Kerby Less, ATC  ANESTHESIA:  general plus interscalene nerve block  IV FLUIDS AND URINE: See anesthesia record.  ANTIBIOTICS: Ancef 2g  ESTIMATED BLOOD LOSS: 5 mL.  IMPLANTS:  Implant Name Type Inv. Item Serial No. Manufacturer Lot No. LRB No. Used Action  ANCHOR JUGGERKNOT 2X2.9 WH/BL - [id number] Anchor ANCHOR JUGGERKNOT 2X2.9 WH/BL  [employer](ORTH,TRAU,BIO,SG) [id number] Left 1 Implanted  ANCHOR SUT QUATTRO KNTLS 4.5 - [id number] Anchor ANCHOR SUT QUATTRO KNTLS 4.5  [employer]ON(ORTH,TRAU,BIO,SG) [id number] Left 1 Implanted    DRAINS: None  CULTURES: None  COMPLICATIONS: none  PROCEDURE:    OPERATIVE FINDING: Exam under anesthesia:   Examination under anesthesia revealed forward elevation of 150 degrees.  With the arm at the side, there was 65 degrees of external rotation.  There is a 1+ anterior load shift and a 1+ posterior load shift.    Arthroscopic findings demonstrated: Articular space: Significant anterior labral degeneration Chondral surfaces: Grade 1 changes of the humeral head and glenoid Biceps: significant fraying Subscapularis: Intact Supraspinatus: Near full thickness tear Infraspinatus: Intact    I identified the patient in the pre-operative holding area.  I marked the operative right shoulder with my initials. I reviewed the risks  and benefits of the proposed surgical intervention and the patient wished to proceed.  Anesthesia was then performed with regional block.  The patient was transferred to the operative suite and placed in the beach chair position with all bony prominences padded.     SCDs were placed on bilateral lower extremity. Appropriate antibiotics was administered within 1 hour before incision.  Anesthesia was induced.  The operative extremity was then prepped and draped in standard fashion. A time out was performed confirming the correct extremity, correct patient and correct procedure.   The arthroscope was introduced in the glenohumeral joint from a posterior portal.  An anterior portal was created.  The shoulder was examined and the above findings were noted.     With an arthroscopic shaver and a wand ablator, synovitis throughout the  shoulder was resected.  The arthroscopic shaver was used to excise torn portions of the labrum back to a stable margin. Specifically this was done for the anterior and superior labrum.  A shaver was introduced into the anterior portal and used to debride the undersurface of the acromion and remove impinging osteophyte.  This was done in the subacromial space.  At this time the biceps was tagged with a self passing device with a #2 nonabsorbable suture.  This was done through the anterior portal.  A subsequent lateral portal was established through the subacromial space and the suture was retrieved out through this portal.  Using this lateral portal, an additional #2 nonabsorbable tape was passed in a [place] and repeated fashion through the biceps tendon 3 times.  This was then brought back to the anterior portal and saved for passage into the lateral row.   Through visualization from intra-articular, the footprint of the rotator cuff  was debrided of soft tissues back to bleeding bone.  This was done with an arthroscopic shaver.     The rotator cuff was then approached through the  subacromial space. Anterior, lateral, and posterior portals were used.  Bursectomy was performed with an arthroscopic shaver and [employer] wand.  The soft tissues on the undersurface of the acromion and clavicle were resected with the arthroscopic shaver and wand. There was good excursion that was noted of the tendon back to its footprint which would be amendable to an repair.   The rotator cuff was repaired with a double row configuration with one double loaded medial row all suture suture anchor as noted above with sutures passed from anterior to posterior in horizontal fashion with a self passing suture device.  The sutures included the biceps suture was placed with a single lateral row anchor.  This provided excellent anatomic footprint approximation.   The shoulder was irrigated.  The arthroscopic instruments were removed.  Wounds were closed with 3-0 nylon sutures.  A sterile dressing was applied with xeroform, 4x8s, abdominal pad, and tape. An Flonnie Hailstone was placed and the upper extremity was placed in a shoulder immobilizer.  The patient tolerated the procedure well and was taken to the recovery room in stable condition.  All counts were correct in the case.  houlder immobilizer.  The patient tolerated the procedure well and was taken to the recovery room in stable condition.    POSTOPERATIVE PLAN: She will be in a sling until followup with PT. She will see me back in 2 weeks for suture removal.  Benancio Deeds, MD 10:31 AM

## 2021-11-17 NOTE — Discharge Instructions (Addendum)
Discharge Instructions    Attending Surgeon: Huel Cote, MD Office Phone Number: [phone]   Diagnosis and Procedures:    Surgeries Performed: Left shoulder rotator cuff repair with biceps tenodesis  Discharge Plan:    Diet: Resume usual diet. Begin with light or bland foods.  Drink plenty of fluids.  Activity:  Keep sling and dressing in place until your follow up visit in Physical Therapy You are advised to go home directly from the hospital or surgical center. Restrict your activities.  GENERAL INSTRUCTIONS: 1.  Keep your surgical site elevated above your heart for at least 5-7 days or longer to prevent swelling. This will improve your comfort and your overall recovery following surgery.     2. Please call Dr. Serena Croissant office at [phone] with questions Monday-Friday during business hours. If no one answers, please leave a message and someone should get back to the patient within 24 hours. For emergencies please call 911 or proceed to the emergency room.   3. Patient to notify surgical team if experiences any of the following: Bowel/Bladder dysfunction, uncontrolled pain, nerve/muscle weakness, incision with increased drainage or redness, nausea/vomiting and Fever greater than 101.0 F.  Be alert for signs of infection including redness, streaking, odor, fever or chills. Be alert for excessive pain or bleeding and notify your surgeon immediately.  WOUND INSTRUCTIONS:   Leave your dressing/cast/splint in place until your post operative visit.  Keep it clean and dry.  Always keep the incision clean and dry until the staples/sutures are removed. If there is no drainage from the incision you should keep it open to air. If there is drainage from the incision you must keep it covered at all times until the drainage stops  Do not soak in a bath tub, hot tub, pool, lake or other body of water until 21 days after your surgery and your incision is completely dry and  healed.  If you have removable sutures (or staples) they must be removed 10-14 days (unless otherwise instructed) from the day of your surgery.     1)  Elevate the extremity as much as possible.  2)  Keep the dressing clean and dry.  3)  Please call us if the dressing becomes wet or dirty.  4)  If you are experiencing worsening pain or worsening swelling, please call.     MEDICATIONS: Resume all previous home medications at the previous prescribed dose and frequency unless otherwise noted Start taking the  pain medications on an as-needed basis as prescribed  Please taper down pain medication over the next week following surgery.  Ideally you should not require a refill of any narcotic pain medication.  Take pain medication with food to minimize nausea. In addition to the prescribed pain medication, you may take over-the-counter pain relievers such as Tylenol.  Do NOT take additional tylenol if your pain medication already has tylenol in it.  Aspirin 325mg  daily for four weeks.      FOLLOWUP INSTRUCTIONS: 1. Follow up at the Physical Therapy Clinic 3-4 days following surgery. This appointment should be scheduled unless other arrangements have been made.The Physical Therapy scheduling number is [phone] if an appointment has not already been arranged.  2. Contact Dr. [phone] office during office hours at [phone] or the practice after hours line at [phone] for non-emergencies. For medical emergencies call 911.   Discharge Location: Home    No Tylenol until after 12:45pm today if needed  Post Anesthesia Home Care Instructions  Activity: Get plenty of rest for the remainder of the day. A responsible individual must stay with you for 24 hours following the procedure.  For the next 24 hours, DO NOT: -Drive a car -Advertising copywriter -Drink alcoholic beverages -Take any medication unless instructed by your physician -Make any legal decisions or sign important  papers.  Meals: Start with liquid foods such as gelatin or soup. Progress to regular foods as tolerated. Avoid greasy, spicy, heavy foods. If nausea and/or vomiting occur, drink only clear liquids until the nausea and/or vomiting subsides. Call your physician if vomiting continues.  Special Instructions/Symptoms: Your throat may feel dry or sore from the anesthesia or the breathing tube placed in your throat during surgery. If this causes discomfort, gargle with warm salt water. The discomfort should disappear within 24 hours.  If you had a scopolamine patch placed behind your ear for the management of post- operative nausea and/or vomiting:  1. The medication in the patch is effective for 72 hours, after which it should be removed.  Wrap patch in a tissue and discard in the trash. Wash hands thoroughly with soap and water. 2. You may remove the patch earlier than 72 hours if you experience unpleasant side effects which may include dry mouth, dizziness or visual disturbances. 3. Avoid touching the patch. Wash your hands with soap and water after contact with the patch.     Regional Anesthesia Blocks  1. Numbness or the inability to move the "blocked" extremity may last from 3-48 hours after placement. The length of time depends on the medication injected and your individual response to the medication. If the numbness is not going away after 48 hours, call your surgeon.  2. The extremity that is blocked will need to be protected until the numbness is gone and the  Strength has returned. Because you cannot feel it, you will need to take extra care to avoid injury. Because it may be weak, you may have difficulty moving it or using it. You may not know what position it is in without looking at it while the block is in effect.  3. For blocks in the legs and feet, returning to weight bearing and walking needs to be done carefully. You will need to wait until the numbness is entirely gone and the  strength has returned. You should be able to move your leg and foot normally before you try and bear weight or walk. You will need someone to be with you when you first try to ensure you do not fall and possibly risk injury.  4. Bruising and tenderness at the needle site are common side effects and will resolve in a few days.  5. Persistent numbness or new problems with movement should be communicated to the surgeon or the [facility] [phone] [facility] [phone]).  Donjoy Ultrasling III (Red ball):  Please contact your surgeon if you have questions or concerns about your sling.

## 2021-11-17 NOTE — Interval H&P Note (Signed)
History and Physical Interval Note:  11/17/2021 6:57 AM  Emily Reid  has presented today for surgery, with the diagnosis of LEFT SHOULDER ROTATOR CUFF TEAR, BICEPS TENDON TEAR.  The various methods of treatment have been discussed with the patient and family. After consideration of risks, benefits and other options for treatment, the patient has consented to  Procedure(s): LEFT SHOULDER ARTHROSCOPY WITH ROTATOR CUFF REPAIR AND OPEN BICEPS TENODESIS (Left) as a surgical intervention.  The patient's history has been reviewed, patient examined, no change in status, stable for surgery.  I have reviewed the patient's chart and labs.  Questions were answered to the patient's satisfaction.     Huel Cote

## 2021-11-17 NOTE — Anesthesia Preprocedure Evaluation (Signed)
Anesthesia Evaluation  Patient identified by MRN, date of birth, ID band Patient awake    Reviewed: Allergy & Precautions, NPO status , Patient's Chart, lab work & pertinent test results  Airway Mallampati: II  TM Distance: >3 FB Neck ROM: Full    Dental no notable dental hx.    Pulmonary neg pulmonary ROS,    Pulmonary exam normal breath sounds clear to auscultation       Cardiovascular hypertension, Pt. on medications negative cardio ROS Normal cardiovascular exam Rhythm:Regular Rate:Normal     Neuro/Psych Anxiety Depression negative neurological ROS  negative psych ROS   GI/Hepatic negative GI ROS, Neg liver ROS,   Endo/Other  negative endocrine ROS  Renal/GU negative Renal ROS  negative genitourinary   Musculoskeletal negative musculoskeletal ROS (+)   Abdominal   Peds negative pediatric ROS (+)  Hematology negative hematology ROS (+)   Anesthesia Other Findings   Reproductive/Obstetrics negative OB ROS                             Anesthesia Physical Anesthesia Plan  ASA: 2  Anesthesia Plan: General   Post-op Pain Management: Regional block*, Tylenol PO (pre-op)*, Gabapentin PO (pre-op)* and Minimal or no pain anticipated   Induction: Intravenous  PONV Risk Score and Plan: 3 and Ondansetron, Dexamethasone, Midazolam and Treatment may vary due to age or medical condition  Airway Management Planned: LMA  Additional Equipment:   Intra-op Plan:   Post-operative Plan: Extubation in OR  Informed Consent: I have reviewed the patients History and Physical, chart, labs and discussed the procedure including the risks, benefits and alternatives for the proposed anesthesia with the patient or authorized representative who has indicated his/her understanding and acceptance.     Dental advisory given  Plan Discussed with: CRNA  Anesthesia Plan Comments:          Anesthesia Quick Evaluation

## 2021-11-17 NOTE — Anesthesia Procedure Notes (Signed)
Anesthesia Regional Block: Interscalene brachial plexus block   Pre-Anesthetic Checklist: , timeout performed,  Correct Patient, Correct Site, Correct Laterality,  Correct Procedure, Correct Position, site marked,  Risks and benefits discussed,  Surgical consent,  Pre-op evaluation,  At surgeon's request and post-op pain management  Laterality: Left  Prep: chloraprep       Needles:  Injection technique: Single-shot  Needle Type: Stimiplex     Needle Length: 9cm  Needle Gauge: 21     Additional Needles:   Procedures:,,,, ultrasound used (permanent image in chart),,    Narrative:  Start time: 11/17/2021 6:59 AM End time: 11/17/2021 7:04 AM Injection made incrementally with aspirations every 5 mL.  Performed by: Personally  Anesthesiologist: Lynda Rainwater, MD

## 2021-11-17 NOTE — Anesthesia Postprocedure Evaluation (Signed)
Anesthesia Post Note  Patient: Emily Reid  Procedure(s) Performed: LEFT SHOULDER ARTHROSCOPY WITH ROTATOR CUFF REPAIR AND BICEPS TENODESIS (Left: Shoulder)     Patient location during evaluation: PACU Anesthesia Type: General Level of consciousness: awake and alert Pain management: pain level controlled Vital Signs Assessment: post-procedure vital signs reviewed and stable Respiratory status: spontaneous breathing, nonlabored ventilation and respiratory function stable Cardiovascular status: blood pressure returned to baseline and stable Postop Assessment: no apparent nausea or vomiting Anesthetic complications: no   No notable events documented.  Last Vitals:  Vitals:   11/17/21 1115 11/17/21 1130  BP: 127/63 (!) 115/55  Pulse: 84 79  Resp: 18 16  Temp:  36.6 C  SpO2: 95% 95%    Last Pain:  Vitals:   11/17/21 1130  TempSrc:   PainSc: 0-No pain                 Lowella Curb

## 2021-11-17 NOTE — Anesthesia Procedure Notes (Signed)
Procedure Name: LMA Insertion Date/Time: 11/17/2021 7:37 AM Performed by: Alford Highland, CRNA Pre-anesthesia Checklist: Patient identified, Emergency Drugs available, Suction available and Patient being monitored Patient Re-evaluated:Patient Re-evaluated prior to induction Oxygen Delivery Method: Circle System Utilized Preoxygenation: Pre-oxygenation with 100% oxygen Induction Type: IV induction Ventilation: Mask ventilation without difficulty LMA: LMA inserted LMA Size: 4.0 Number of attempts: 1 Airway Equipment and Method: Bite block Placement Confirmation: positive ETCO2 Tube secured with: Tape Dental Injury: Teeth and Oropharynx as per pre-operative assessment

## 2021-11-17 NOTE — Progress Notes (Signed)
Assisted Dr. Miller with left, interscalene , ultrasound guided block. Side rails up, monitors on throughout procedure. See vital signs in flow sheet. Tolerated Procedure well. 

## 2021-11-17 NOTE — Brief Op Note (Signed)
   Brief Op Note  Date of Surgery: 11/17/2021  Preoperative Diagnosis: LEFT SHOULDER ROTATOR CUFF TEAR, BICEPS TENDON TEAR  Postoperative Diagnosis: same  Procedure: Procedure(s): LEFT SHOULDER ARTHROSCOPY WITH ROTATOR CUFF REPAIR AND BICEPS TENODESIS  Implants: Implant Name Type Inv. Item Serial No. Manufacturer Lot No. LRB No. Used Action  ANCHOR JUGGERKNOT 2X2.9 WH/BL - [id number] Anchor ANCHOR JUGGERKNOT 2X2.9 WH/BL  [employer](ORTH,TRAU,BIO,SG) [id number] Left 1 Implanted  ANCHOR SUT QUATTRO KNTLS 4.5 - [id number] Anchor ANCHOR SUT QUATTRO KNTLS 4.5  [employer](ORTH,TRAU,BIO,SG) [id number] Left 1 Implanted    Surgeons: Surgeon(s): Huel Cote, MD  Anesthesia: Regional    Estimated Blood Loss: See anesthesia record  Complications: None  Condition to PACU: Stable  Benancio Deeds, MD 11/17/2021 10:30 AM

## 2021-11-17 NOTE — Transfer of Care (Signed)
Immediate Anesthesia Transfer of Care Note  Patient: Emily Reid  Procedure(s) Performed: LEFT SHOULDER ARTHROSCOPY WITH ROTATOR CUFF REPAIR AND BICEPS TENODESIS (Left: Shoulder)  Patient Location: PACU  Anesthesia Type:General and Regional  Level of Consciousness: drowsy  Airway & Oxygen Therapy: Patient Spontanous Breathing and Patient connected to face mask oxygen  Post-op Assessment: Report given to RN and Post -op Vital signs reviewed and stable  Post vital signs: Reviewed and stable  Last Vitals:  Vitals Value Taken Time  BP 129/62 11/17/21 1040  Temp    Pulse 80 11/17/21 1042  Resp 15 11/17/21 1042  SpO2 96 % 11/17/21 1042  Vitals shown include unvalidated device data.  Last Pain:  Vitals:   11/17/21 0636  TempSrc: Oral  PainSc: 0-No pain      Patients Stated Pain Goal: 5 (11/17/21 0636)  Complications: No notable events documented.

## 2021-11-18 ENCOUNTER — Encounter (HOSPITAL_BASED_OUTPATIENT_CLINIC_OR_DEPARTMENT_OTHER): Payer: Self-pay | Admitting: Orthopaedic Surgery

## 2021-11-18 NOTE — Progress Notes (Signed)
Left message stating courtesy call and if any questions or concerns please call the doctors office.  

## 2021-11-20 ENCOUNTER — Ambulatory Visit (HOSPITAL_BASED_OUTPATIENT_CLINIC_OR_DEPARTMENT_OTHER): Payer: Medicare PPO | Attending: Orthopaedic Surgery | Admitting: Physical Therapy

## 2021-11-20 ENCOUNTER — Encounter (HOSPITAL_BASED_OUTPATIENT_CLINIC_OR_DEPARTMENT_OTHER): Payer: Self-pay | Admitting: Physical Therapy

## 2021-11-20 DIAGNOSIS — R293 Abnormal posture: Secondary | ICD-10-CM | POA: Diagnosis not present

## 2021-11-20 DIAGNOSIS — M6281 Muscle weakness (generalized): Secondary | ICD-10-CM | POA: Insufficient documentation

## 2021-11-20 DIAGNOSIS — M25512 Pain in left shoulder: Secondary | ICD-10-CM | POA: Diagnosis not present

## 2021-11-20 NOTE — Progress Notes (Signed)
[facility] Note  11/25/2021     CHIEF COMPLAINT Patient presents for Retina Follow Up   HISTORY OF PRESENT ILLNESS: Emily Reid is a 76 y.o. female who presents to the clinic today for:  HPI     Retina Follow Up   Patient presents with  CRVO/BRVO.  In right eye.  Severity is moderate.  Duration of 11 weeks.  Since onset it is stable.  I, the attending physician,  performed the HPI with the patient and updated documentation appropriately.        Comments   Pt here for 11 wk ret f/u for BRVO OD. Pt states VA the same, no change.       Last edited by Bernarda Caffey, MD on 11/27/2021  1:08 AM.     Referring physician: Melbourne Abts MD [address] [place], [place] [id number]  HISTORICAL INFORMATION:   Selected notes from the MEDICAL RECORD NUMBER Referred by Dr. Joni Fears for continuation of eye injections OD for BRVO with edema LEE: 08/05/2020 Ocular Hx- BRVO, CME, iritis OD PMH- HTN   CURRENT MEDICATIONS: No current outpatient medications on file. (Ophthalmic Drugs)   No current facility-administered medications for this visit. (Ophthalmic Drugs)   Current Outpatient Medications (Other)  Medication Sig   amLODipine (NORVASC) 10 MG tablet Take 1 tablet (10 mg total) by mouth daily.   buPROPion (WELLBUTRIN XL) 150 MG 24 hr tablet Take 150 mg by mouth daily.   Coenzyme Q10 (COQ10) 100 MG CAPS Take by mouth daily.   LOSARTAN POTASSIUM PO Take 1 tablet by mouth daily.   mirtazapine (REMERON) 15 MG tablet TAKE 1 TABLET AT BEDTIME   Multiple Vitamin (MULTIVITAMIN) tablet Take 1 tablet by mouth daily.   Phosphatidylserine 100 MG CAPS Take by mouth.   rosuvastatin (CRESTOR) 20 MG tablet Take 20 mg by mouth daily.   sertraline (ZOLOFT) 50 MG tablet TAKE 1 TABLET EVERY DAY   Turmeric (QC TUMERIC COMPLEX PO) Take 553 mg by mouth daily.   vitamin C (ASCORBIC ACID) 500 MG tablet Take 500 mg by mouth daily.   aspirin EC 325 MG tablet Take 1  tablet (325 mg total) by mouth daily. (Patient not taking: Reported on 11/13/2021)   oxyCODONE (OXY IR/ROXICODONE) 5 MG immediate release tablet Take 1 tablet (5 mg total) by mouth every 4 (four) hours as needed (severe pain). (Patient not taking: Reported on 11/13/2021)   No current facility-administered medications for this visit. (Other)   REVIEW OF SYSTEMS: ROS   Positive for: Genitourinary, Eyes Negative for: Constitutional, Gastrointestinal, Neurological, Skin, Musculoskeletal, HENT, Endocrine, Cardiovascular, Respiratory, Psychiatric, Allergic/Imm, Heme/Lymph Last edited by Kingsley Spittle, COT on 11/25/2021  1:41 PM.     ALLERGIES No Known Allergies  PAST MEDICAL HISTORY Past Medical History:  Diagnosis Date   Anxiety    Cataract    Mixed OU   Depression    Hyperlipidemia    Hypertension    Hypertensive retinopathy    OU   Left rotator cuff tear    Urine incontinence    Past Surgical History:  Procedure Laterality Date   FACIAL COSMETIC SURGERY     SHOULDER ARTHROSCOPY WITH SUBACROMIAL DECOMPRESSION, ROTATOR CUFF REPAIR AND BICEP TENDON REPAIR Left 11/17/2021   Procedure: LEFT SHOULDER ARTHROSCOPY WITH ROTATOR CUFF REPAIR AND BICEPS TENODESIS;  Surgeon: Vanetta Mulders, MD;  Location: [facility];  Service: Orthopedics;  Laterality: Left;   TONSILLECTOMY  1957  tummy tuck     FAMILY HISTORY Family History  Problem Relation Age of Onset   Heart disease Mother    Hyperlipidemia Mother    Hypertension Mother    Stroke Mother    SOCIAL HISTORY Social History   Tobacco Use   Smoking status: Never   Smokeless tobacco: Never  Vaping Use   Vaping Use: Never used  Substance Use Topics   Alcohol use: Yes    Alcohol/week: 7.0 standard drinks    Types: 7 Glasses of wine per week    Comment: social   Drug use: No       OPHTHALMIC EXAM:  Base Eye Exam     Visual Acuity (Snellen - Linear)       Right Left   Dist [place] 20/25 -2    Dist cc   20/20   Dist ph [place] 20/20     Correction: Glasses         Tonometry (Tonopen, 1:45 PM)       Right Left   Pressure 13 13         Pupils       Pupils Dark Light Shape React APD   Right PERRL 3 2 Round Brisk None   Left PERRL 3 2 Round Brisk None         Visual Fields (Counting fingers)       Left Right    Full Full         Extraocular Movement       Right Left    Full, Ortho Full, Ortho         Neuro/Psych     Oriented x3: Yes   Mood/Affect: Normal         Dilation     Both eyes: 1.0% Mydriacyl, 2.5% Phenylephrine @ 1:46 PM           Slit Lamp and Fundus Exam     Slit Lamp Exam       Right Left   Lids/Lashes Dermatochalasis - upper lid Dermatochalasis - upper lid   Conjunctiva/Sclera White and quiet White and quiet   Cornea 1+ Punctate epithelial erosions, early band K nasal and temporal 1+ Punctate epithelial erosions, early band K nasal and temporal   Anterior Chamber Deep and quiet Deep and quiet   Iris Round and dilated Round and dilated   Lens 2+ Nuclear sclerosis, 1-2+ Cortical cataract, focal Posterior subcapsular cataract at 0700 2+ Nuclear sclerosis, 2+ Cortical cataract, focal Posterior subcapsular cataract at 0700   Anterior Vitreous Vitreous syneresis, silicone oil micro bubbles Vitreous syneresis, silicone oil micro bubbles, Posterior vitreous detachment         Fundus Exam       Right Left   Disc Pink and Sharp, mild PPA Pink and Sharp   C/D Ratio 0.3 0.2   Macula Flat, Blunted foveal reflex, Drusen, RPE mottling, No heme, interval improvement in central cystic changes / edema Flat, Blunted foveal reflex, Drusen, RPE mottling and clumping, No heme or edema   Vessels mild attenuation, mild tortuosity attenuated, mild tortuousity   Periphery Attached, no heme Attached, mild mid zonal drusen, no heme              IMAGING AND PROCEDURES  Imaging and Procedures for 11/25/2021  OCT, Retina - OU - Both Eyes       Right  Eye Quality was good. Central Foveal Thickness: 291. Progression has improved. Findings include normal foveal contour, no SRF, intraretinal fluid,  abnormal foveal contour (Hx of BRVO, interval improvement in cystic changes IT fovea -- just trace cystic changes remain).   Left Eye Quality was good. Central Foveal Thickness: 290. Progression has been stable. Findings include normal foveal contour, no IRF, no SRF, retinal drusen .   Notes *Images captured and stored on drive  Diagnosis / Impression:  OD: Hx of BRVO, interval improvement in cystic changes IT fovea -- just trace cystic changes remain OS: NFP, no IRF/SRF, +drusen  Clinical management:  See below  Abbreviations: NFP - Normal foveal profile. CME - cystoid macular edema. PED - pigment epithelial detachment. IRF - intraretinal fluid. SRF - subretinal fluid. EZ - ellipsoid zone. ERM - epiretinal membrane. ORA - outer retinal atrophy. ORT - outer retinal tubulation. SRHM - subretinal hyper-reflective material. IRHM - intraretinal hyper-reflective material      Intravitreal Injection, Pharmacologic Agent - OD - Right Eye       Time Out 11/25/2021. 2:03 PM. Confirmed correct patient, procedure, site, and patient consented.   Anesthesia Topical anesthesia was used. Anesthetic medications included Lidocaine 2%, Proparacaine 0.5%.   Procedure Preparation included 5% betadine to ocular surface, eyelid speculum. A supplied (32g) needle was used.   Injection: 1.25 mg Bevacizumab 1.98m/0.05ml   Route: Intravitreal, Site: Right Eye   NDC:: 41962-229-79 Lot: [id number] , Expiration date: 01/12/2022   Post-op Post injection exam found visual acuity of at least counting fingers. The patient tolerated the procedure well. There were no complications. The patient received written and verbal post procedure care education. Post injection medications were not given.            ASSESSMENT/PLAN:    ICD-10-CM   1. Branch retinal vein  occlusion of right eye with macular edema  H34.8310 OCT, Retina - OU - Both Eyes    Intravitreal Injection, Pharmacologic Agent - OD - Right Eye    Bevacizumab (AVASTIN) SOLN 1.25 mg    2. Essential hypertension  I10     3. Hypertensive retinopathy of both eyes  H35.033     4. Combined forms of age-related cataract of both eyes  H25.813      1. BRVO w/ CME OD             - s/p IVA OD #1 (03.30.22), #2 (05.23.22), #3 (7.25.22), #4 (10.05.22), #5 (12.14.22), #6 (03.15.23)  - [id number] pt was receiving Lucentis q7-8 wks; pt reports history Avastin, but was switched to Lucentis at some point - last injection IVL OD was 02.04.22 (7.5 wks prior to initial visit here) - pt reports hx of worsening vision and OCT around 10 weeks - **worsening of IRF on OCT at 13 weeks on 03.15.23** - BCVA OD is 20/20 (stable) - OCT today shows interval improvement in cystic changes IT fovea at 11 weeks - recommend IVA OD #7 today, 05.31.23 -- maintenance therapy w/ f/u in 11 wks - RBA of procedure discussed, questions answered - informed consent obtained and signed - see procedure note - F/U 11 weeks -- DFE/OCT/possible injection  2,3. Hypertensive retinopathy OU - discussed importance of tight BP control - monitor  4. Mixed Cataract OU - The symptoms of cataract, surgical options, and treatments and risks were discussed with patient. - discussed diagnosis and progression - not yet visually significant - monitor for now  Ophthalmic Meds Ordered this visit:  Meds ordered this encounter  Medications   Bevacizumab (AVASTIN) SOLN 1.25 mg     Return  in about 11 weeks (around 02/10/2022) for f/u BRVO OD, DFE, OCT.  There are no Patient Instructions on file for this visit.  This document serves as a record of services personally performed by Gardiner Sleeper, MD, PhD. It was created on their behalf by Orvan Falconer, an ophthalmic technician. The creation of  this record is the provider's dictation and/or activities during the visit.    Electronically signed by: Orvan Falconer, OA, 11/27/21  1:10 AM  This document serves as a record of services personally performed by Gardiner Sleeper, MD, PhD. It was created on their behalf by San Jetty. Owens Shark, OA an ophthalmic technician. The creation of this record is the provider's dictation and/or activities during the visit.    Electronically signed by: San Jetty. Roper, OA 05.31.2023 1:10 AM  Gardiner Sleeper, M.D., Ph.D. Diseases & Surgery of the Retina and Vitreous [facility]  I have reviewed the above documentation for accuracy and completeness, and I agree with the above. Gardiner Sleeper, M.D., Ph.D. 11/27/21 1:11 AM   Abbreviations: M myopia (nearsighted); A astigmatism; H hyperopia (farsighted); P presbyopia; Mrx spectacle prescription;  CTL contact lenses; OD right eye; OS left eye; OU both eyes  XT exotropia; ET esotropia; PEK punctate epithelial keratitis; PEE punctate epithelial erosions; DES dry eye syndrome; MGD meibomian gland dysfunction; ATs artificial tears; PFAT's preservative free artificial tears; [place] nuclear sclerotic cataract; PSC posterior subcapsular cataract; ERM epi-retinal membrane; PVD posterior vitreous detachment; RD retinal detachment; DM diabetes mellitus; DR diabetic retinopathy; NPDR non-proliferative diabetic retinopathy; PDR proliferative diabetic retinopathy; CSME clinically significant macular edema; DME diabetic macular edema; dbh dot blot hemorrhages; CWS cotton wool spot; POAG primary open angle glaucoma; C/D cup-to-disc ratio; HVF humphrey visual field; GVF goldmann visual field; OCT optical coherence tomography; IOP intraocular pressure; BRVO Branch retinal vein occlusion; CRVO central retinal vein occlusion; CRAO central retinal artery occlusion; BRAO branch retinal artery occlusion; RT retinal tear; SB scleral buckle; PPV pars plana vitrectomy; VH  Vitreous hemorrhage; PRP panretinal laser photocoagulation; IVK intravitreal kenalog; VMT vitreomacular traction; MH Macular hole;  NVD neovascularization of the disc; NVE neovascularization elsewhere; AREDS age related eye disease study; ARMD age related macular degeneration; POAG primary open angle glaucoma; EBMD epithelial/anterior basement membrane dystrophy; ACIOL anterior chamber intraocular lens; IOL intraocular lens; PCIOL posterior chamber intraocular lens; Phaco/IOL phacoemulsification with intraocular lens placement; [place] photorefractive keratectomy; LASIK laser assisted in situ keratomileusis; HTN hypertension; DM diabetes mellitus; COPD chronic obstructive pulmonary disease

## 2021-11-20 NOTE — Therapy (Signed)
OUTPATIENT PHYSICAL THERAPY SHOULDER EVALUATION   Patient Name: Emily Reid MRN: [id number] DOB:06/05/1946, 76 y.o., female Today's Date: 11/20/2021   PT End of Session - 11/20/21 1152     Visit Number 1    Number of Visits 25    Date for PT Re-Evaluation 02/12/22    Authorization Type [employer] MCR    PT Start Time 0804    PT Stop Time 0845    PT Time Calculation (min) 41 min    Activity Tolerance Patient tolerated treatment well    Behavior During Therapy WFL for tasks assessed/performed             Past Medical History:  Diagnosis Date   Anxiety    Cataract    Mixed OU   Depression    Hyperlipidemia    Hypertension    Hypertensive retinopathy    OU   Left rotator cuff tear    Urine incontinence    Past Surgical History:  Procedure Laterality Date   FACIAL COSMETIC SURGERY     SHOULDER ARTHROSCOPY WITH SUBACROMIAL DECOMPRESSION, ROTATOR CUFF REPAIR AND BICEP TENDON REPAIR Left 11/17/2021   Procedure: LEFT SHOULDER ARTHROSCOPY WITH ROTATOR CUFF REPAIR AND BICEPS TENODESIS;  Surgeon: Vanetta Mulders, MD;  Location: [facility];  Service: Orthopedics;  Laterality: Left;   TONSILLECTOMY  1957   tummy tuck     Patient Active Problem List   Diagnosis Date Noted   Biceps tendonosis of left shoulder    Nontraumatic incomplete tear of right rotator cuff    Numbness of toes 11/27/2020   Stage 3b chronic kidney disease ([facility]) 11/04/2020   Routine general medical examination at a health care facility 10/31/2020   Visit for screening mammogram 10/28/2020   Primary hypertension 10/28/2020   Hyperlipidemia LDL goal <130 10/28/2020   HIP PAIN 12/10/2008   ILIOTIBIAL BAND SYNDROME 12/10/2008    PCP: Ardeth Perfect, MD  REFERRING PROVIDER: Vanetta Mulders, MD  REFERRING DIAG: M75.111 (ICD-10-CM) - Nontraumatic incomplete tear of right rotator cuff  THERAPY DIAG:  Acute pain of left shoulder  Muscle weakness (generalized)  Abnormal posture  Rationale  for Evaluation and Treatment Rehabilitation  ONSET DATE: DOS 11/17/21  SUBJECTIVE:                                                                                                                                                                                      SUBJECTIVE STATEMENT: I am feeling really good. Taking tylenol and aspirin. A little N/T in fingers.   PERTINENT HISTORY: none  PAIN:  Are you having pain? No  PRECAUTIONS: Shoulder  WEIGHT BEARING RESTRICTIONS Yes NWB  FALLS:  Has patient fallen in last 6 months? No  LIVING ENVIRONMENT: Lives with: lives with their spouse   OCCUPATION: Not working  PLOF: Independent  PATIENT GOALS aerobics, yoga, walking, I am active and I want to stay that way  OBJECTIVE:   PATIENT SURVEYS:  FOTO 4  COGNITION:  Overall cognitive status: Within functional limits for tasks assessed        POSTURE: Forward rounded posture as expected in sling  UPPER EXTREMITY ROM:   Passive ROM Left eval  Shoulder flexion 90  Shoulder extension   Shoulder abduction 45  Shoulder adduction   Shoulder internal rotation   Shoulder external rotation   Elbow flexion   Elbow extension   Wrist flexion   Wrist extension   Wrist ulnar deviation   Wrist radial deviation   Wrist pronation   Wrist supination   (Blank rows = not tested)  UPPER EXTREMITY MMT:  MMT Right  Left   Shoulder flexion    Shoulder extension    Shoulder abduction    Shoulder adduction    Shoulder internal rotation    Shoulder external rotation    Middle trapezius    Lower trapezius    Elbow flexion    Elbow extension    Wrist flexion    Wrist extension    Wrist ulnar deviation    Wrist radial deviation    Wrist pronation    Wrist supination    Grip strength (lbs)    (Blank rows = not tested)  PALPATION:  Empty end feel without pain   TODAY'S TREATMENT:  Eval PROM in supine, PT removed bandages & replaced with gauze/tegaderm, refit sling.   Scap retraction Passive elbow flexion pendulums    PATIENT EDUCATION: Education details: Anatomy of condition, POC, HEP, exercise form/rationale  Person educated: Patient Education method: Explanation, Demonstration, Tactile cues, Verbal cues, and Handouts Education comprehension: verbalized understanding, returned demonstration, verbal cues required, tactile cues required, and needs further education   HOME EXERCISE PROGRAM: [id number]  ASSESSMENT:  CLINICAL IMPRESSION: Patient is a 76 y.o. F who was seen today for physical therapy evaluation and treatment for rehab following RCR and biceps tenodesis.    OBJECTIVE IMPAIRMENTS decreased activity tolerance, decreased ROM, decreased strength, increased muscle spasms, impaired UE functional use, improper body mechanics, postural dysfunction, and pain.   ACTIVITY LIMITATIONS carrying, lifting, bed mobility, bathing, dressing, reach over head, hygiene/grooming, and caring for others  PARTICIPATION LIMITATIONS: meal prep, cleaning, laundry, driving, community activity, and fitness  PERSONAL FACTORS none  REHAB POTENTIAL: Good  CLINICAL DECISION MAKING: Stable/uncomplicated  EVALUATION COMPLEXITY: Low   GOALS: Goals reviewed with patient? Yes  SHORT TERM GOALS: Target date: 12/18/2021  PROM to meet stated protocol goals Baseline: see chart Goal status: INITIAL  2.  Resting pain <=3/10 Baseline:  Goal status: INITIAL   LONG TERM GOALS: Target date: 02/12/2022    Shoulder MMT at least 50% of opp UE per dynamometry testing Baseline:  Goal status: INITIAL  2.  Able to perform full overhead motion with 2lb without elevation of shoulder Baseline:  Goal status: INITIAL  3.  Resolution of scapular winging Baseline:  Goal status: INITIAL  4.  Able to perform self care activities without assistance Baseline:  Goal status: INITIAL  5.  Further goals to be set at this date   PLAN: PT FREQUENCY: 1-2x/week  PT  DURATION: 12 weeks  PLANNED INTERVENTIONS: Therapeutic exercises, Therapeutic activity, Neuromuscular re-education, Patient/Family education, Joint mobilization, Aquatic Therapy, Dry Needling, Electrical stimulation,  Cryotherapy, Moist heat, Taping, Vasopneumatic device, Manual therapy, and Re-evaluation  PLAN FOR NEXT SESSION: continue per protocol  Referring diagnosis? M75.111 (ICD-10-CM) - Nontraumatic incomplete tear of right rotator cuff Treatment diagnosis? (if different than referring diagnosis) M75.111 (ICD-10-CM) - Nontraumatic incomplete tear of right rotator cuff What was this (referring dx) caused by? [x]  Surgery []  Fall []  Ongoing issue []  Arthritis []  Other: ____________  Laterality: []  Rt [x]  Lt []  Both  Check all possible CPT codes:  *CHOOSE 10 OR LESS*    []  97110 (Therapeutic Exercise)  []  92507 (SLP Treatment)  []  97112 (Neuro Re-ed)   []  92526 (Swallowing Treatment)   []  97116 (Gait Training)   []  [id number] (Cognitive Training, 1st 15 minutes) []  97140 (Manual Therapy)   []  97130 (Cognitive Training, each add'l 15 minutes)  []  97164 (Re-evaluation)                              []  Other, List CPT Code ____________  []  [id number] (Therapeutic Activities)     []  [id number] (Self Care)   [x]  All codes above (97110 - 97535)  []  97012 (Mechanical Traction)  [x]  97014 (E-stim Unattended)  []  97032 (E-stim manual)  []  29562 (Ionto)  []  97035 (Ultrasound)  []  97760 (Orthotic Fit) [x]  97750 (Physical Performance Training) [x]  [id number] (Aquatic Therapy) []  97034 (Contrast Bath) []  [id number] (Paraffin) []  97597 (Wound Care 1st 20 sq cm) []  97598 (Wound Care each add'l 20 sq cm) []  97016 (Vasopneumatic Device) []  [phone] Comptroller) []  [id number] (Prosthetic Training)  Gay Filler. Shuntel Fishburn PT, DPT 11/20/21 12:07 PM

## 2021-11-25 ENCOUNTER — Ambulatory Visit (INDEPENDENT_AMBULATORY_CARE_PROVIDER_SITE_OTHER): Payer: Medicare PPO | Admitting: Ophthalmology

## 2021-11-25 ENCOUNTER — Encounter (INDEPENDENT_AMBULATORY_CARE_PROVIDER_SITE_OTHER): Payer: Self-pay | Admitting: Ophthalmology

## 2021-11-25 DIAGNOSIS — H35033 Hypertensive retinopathy, bilateral: Secondary | ICD-10-CM | POA: Diagnosis not present

## 2021-11-25 DIAGNOSIS — I10 Essential (primary) hypertension: Secondary | ICD-10-CM

## 2021-11-25 DIAGNOSIS — H25813 Combined forms of age-related cataract, bilateral: Secondary | ICD-10-CM

## 2021-11-25 DIAGNOSIS — H34831 Tributary (branch) retinal vein occlusion, right eye, with macular edema: Secondary | ICD-10-CM

## 2021-11-27 ENCOUNTER — Encounter (HOSPITAL_BASED_OUTPATIENT_CLINIC_OR_DEPARTMENT_OTHER): Payer: Self-pay | Admitting: Physical Therapy

## 2021-11-27 ENCOUNTER — Encounter (INDEPENDENT_AMBULATORY_CARE_PROVIDER_SITE_OTHER): Payer: Self-pay | Admitting: Ophthalmology

## 2021-11-27 ENCOUNTER — Ambulatory Visit (HOSPITAL_BASED_OUTPATIENT_CLINIC_OR_DEPARTMENT_OTHER): Payer: Medicare PPO | Attending: Orthopaedic Surgery | Admitting: Physical Therapy

## 2021-11-27 DIAGNOSIS — R293 Abnormal posture: Secondary | ICD-10-CM | POA: Insufficient documentation

## 2021-11-27 DIAGNOSIS — M75111 Incomplete rotator cuff tear or rupture of right shoulder, not specified as traumatic: Secondary | ICD-10-CM | POA: Insufficient documentation

## 2021-11-27 DIAGNOSIS — M25512 Pain in left shoulder: Secondary | ICD-10-CM | POA: Diagnosis not present

## 2021-11-27 DIAGNOSIS — M6281 Muscle weakness (generalized): Secondary | ICD-10-CM | POA: Diagnosis not present

## 2021-11-27 MED ORDER — BEVACIZUMAB CHEMO INJECTION 1.25MG/0.05ML SYRINGE FOR KALEIDOSCOPE
1.2500 mg | INTRAVITREAL | Status: AC | PRN
  Administered 2021-11-27: 1.25 mg via INTRAVITREAL

## 2021-11-27 NOTE — Therapy (Signed)
OUTPATIENT PHYSICAL THERAPY SHOULDER EVALUATION   Patient Name: Emily Reid MRN: [id number] DOB:1946/03/31, 76 y.o., female Today's Date: 11/29/2021   PT End of Session - 11/29/21 0857     Number of Visits 25    Date for PT Re-Evaluation 02/12/22    Authorization Type [employer] MCR    PT Start Time 1605    PT Stop Time 1643    PT Time Calculation (min) 38 min    Activity Tolerance Patient tolerated treatment well    Behavior During Therapy WFL for tasks assessed/performed              Past Medical History:  Diagnosis Date   Anxiety    Cataract    Mixed OU   Depression    Hyperlipidemia    Hypertension    Hypertensive retinopathy    OU   Left rotator cuff tear    Urine incontinence    Past Surgical History:  Procedure Laterality Date   FACIAL COSMETIC SURGERY     SHOULDER ARTHROSCOPY WITH SUBACROMIAL DECOMPRESSION, ROTATOR CUFF REPAIR AND BICEP TENDON REPAIR Left 11/17/2021   Procedure: LEFT SHOULDER ARTHROSCOPY WITH ROTATOR CUFF REPAIR AND BICEPS TENODESIS;  Surgeon: Huel CoteBokshan, Steven, MD;  Location: [facility];  Service: Orthopedics;  Laterality: Left;   TONSILLECTOMY  1957   tummy tuck     Patient Active Problem List   Diagnosis Date Noted   Biceps tendonosis of left shoulder    Nontraumatic incomplete tear of right rotator cuff    Numbness of toes 11/27/2020   Stage 3b chronic kidney disease ([facility]) 11/04/2020   Routine general medical examination at a health care facility 10/31/2020   Visit for screening mammogram 10/28/2020   Primary hypertension 10/28/2020   Hyperlipidemia LDL goal <130 10/28/2020   HIP PAIN 12/10/2008   ILIOTIBIAL BAND SYNDROME 12/10/2008    PCP: Link SnufferHolwerda, MD  REFERRING PROVIDER: Huel CoteBokshan, Steven, MD  REFERRING DIAG: M75.111 (ICD-10-CM) - Nontraumatic incomplete tear of right rotator cuff  THERAPY DIAG:  Acute pain of left shoulder  Muscle weakness (generalized)  Abnormal posture  Rationale for Evaluation and  Treatment Rehabilitation  ONSET DATE: DOS 11/17/21  SUBJECTIVE:                                                                                                                                                                                      SUBJECTIVE STATEMENT: Pt reports shoulder feeling great; pain is well controlled and pt has stopped taking pain meds.  PERTINENT HISTORY: none  PAIN:  Are you having pain? No  PRECAUTIONS: Shoulder  WEIGHT BEARING RESTRICTIONS Yes NWB  FALLS:  Has patient fallen in  last 6 months? No  LIVING ENVIRONMENT: Lives with: lives with their spouse   OCCUPATION: Not working  PLOF: Independent  PATIENT GOALS aerobics, yoga, walking, I am active and I want to stay that way  OBJECTIVE:   PATIENT SURVEYS:  FOTO 4  UPPER EXTREMITY ROM:   Passive ROM Left eval Left 6/2   Shoulder flexion 90 95  Shoulder extension    Shoulder abduction 45   Shoulder adduction    Shoulder internal rotation    Shoulder external rotation  15 without pushing to end range   Elbow flexion    Elbow extension    Wrist flexion    Wrist extension    Wrist ulnar deviation    Wrist radial deviation    Wrist pronation    Wrist supination    (Blank rows = not tested)  UPPER EXTREMITY MMT:  MMT Right  Left   Shoulder flexion    Shoulder extension    Shoulder abduction    Shoulder adduction    Shoulder internal rotation    Shoulder external rotation    Middle trapezius    Lower trapezius    Elbow flexion    Elbow extension    Wrist flexion    Wrist extension    Wrist ulnar deviation    Wrist radial deviation    Wrist pronation    Wrist supination    Grip strength (lbs)    (Blank rows = not tested)    TODAY'S TREATMENT:   6/2 PROM flexion and ER in supine Pendulums in all directions x20 each   Reviewed HEP    5/26 PROM in supine, PT removed bandages & replaced with gauze/tegaderm, refit sling.  Scap retraction Passive elbow  flexion pendulums    PATIENT EDUCATION: Education details: Anatomy of condition, POC, HEP, exercise form/rationale  Person educated: Patient Education method: Explanation, Demonstration, Tactile cues, Verbal cues, and Handouts Education comprehension: verbalized understanding, returned demonstration, verbal cues required, tactile cues required, and needs further education   HOME EXERCISE PROGRAM: [id number]  ASSESSMENT:  CLINICAL IMPRESSION: Patient is a 76 y.o. F who was seen today for physical therapy evaluation and treatment for rehab following RCR and biceps tenodesis on 11/17/2021.   Patient tolerated treatment well. Her ROM is progressing as would be expected. Therapy will continue to advance ROM per protocol. She had no increase in pain.    OBJECTIVE IMPAIRMENTS decreased activity tolerance, decreased ROM, decreased strength, increased muscle spasms, impaired UE functional use, improper body mechanics, postural dysfunction, and pain.   ACTIVITY LIMITATIONS carrying, lifting, bed mobility, bathing, dressing, reach over head, hygiene/grooming, and caring for others  PARTICIPATION LIMITATIONS: meal prep, cleaning, laundry, driving, community activity, and fitness  PERSONAL FACTORS none  REHAB POTENTIAL: Good  CLINICAL DECISION MAKING: Stable/uncomplicated  EVALUATION COMPLEXITY: Low   GOALS: Goals reviewed with patient? Yes  SHORT TERM GOALS: Target date: 12/18/2021  PROM to meet stated protocol goals Baseline: see chart Goal status: INITIAL  2.  Resting pain <=3/10 Baseline:  Goal status: INITIAL   LONG TERM GOALS: Target date: 02/12/2022    Shoulder MMT at least 50% of opp UE per dynamometry testing Baseline:  Goal status: INITIAL  2.  Able to perform full overhead motion with 2lb without elevation of shoulder Baseline:  Goal status: INITIAL  3.  Resolution of scapular winging Baseline:  Goal status: INITIAL  4.  Able to perform self care activities  without assistance Baseline:  Goal status: INITIAL  5.  Further goals  to be set at this date   PLAN: PT FREQUENCY: 1-2x/week  PT DURATION: 12 weeks  PLANNED INTERVENTIONS: Therapeutic exercises, Therapeutic activity, Neuromuscular re-education, Patient/Family education, Joint mobilization, Aquatic Therapy, Dry Needling, Electrical stimulation, Cryotherapy, Moist heat, Taping, Vasopneumatic device, Manual therapy, and Re-evaluation  PLAN FOR NEXT SESSION: continue per protocol   Lorayne Bender PT DPT  11/29/2021  8:59 AM

## 2021-11-29 ENCOUNTER — Encounter (HOSPITAL_BASED_OUTPATIENT_CLINIC_OR_DEPARTMENT_OTHER): Payer: Self-pay | Admitting: Physical Therapy

## 2021-11-30 ENCOUNTER — Ambulatory Visit (INDEPENDENT_AMBULATORY_CARE_PROVIDER_SITE_OTHER): Payer: Medicare PPO | Admitting: Orthopaedic Surgery

## 2021-11-30 DIAGNOSIS — M75111 Incomplete rotator cuff tear or rupture of right shoulder, not specified as traumatic: Secondary | ICD-10-CM

## 2021-11-30 NOTE — Progress Notes (Signed)
Post Operative Evaluation    Procedure/Date of Surgery: Left shoulder rotator cuff repair and biceps tenodesis 11/17/21  Interval History:    Presents today 2 weeks status post the above procedure.  Overall she is 6 doing extremely well.  She denies any pain in the left shoulder at this time.  She has been compliant with sling usage.  She is working with physical therapy multiple times weekly.  She is taking her aspirin as instructed.   PMH/PSH/Family History/Social History/Meds/Allergies:    Past Medical History:  Diagnosis Date   Anxiety    Cataract    Mixed OU   Depression    Hyperlipidemia    Hypertension    Hypertensive retinopathy    OU   Left rotator cuff tear    Urine incontinence    Past Surgical History:  Procedure Laterality Date   FACIAL COSMETIC SURGERY     SHOULDER ARTHROSCOPY WITH SUBACROMIAL DECOMPRESSION, ROTATOR CUFF REPAIR AND BICEP TENDON REPAIR Left 11/17/2021   Procedure: LEFT SHOULDER ARTHROSCOPY WITH ROTATOR CUFF REPAIR AND BICEPS TENODESIS;  Surgeon: Huel Cote, MD;  Location: [facility];  Service: Orthopedics;  Laterality: Left;   TONSILLECTOMY  1957   tummy tuck     Social History   Socioeconomic History   Marital status: Married    Spouse name: Not on file   Number of children: Not on file   Years of education: Not on file   Highest education level: Not on file  Occupational History   Not on file  Tobacco Use   Smoking status: Never   Smokeless tobacco: Never  Vaping Use   Vaping Use: Never used  Substance and Sexual Activity   Alcohol use: Yes    Alcohol/week: 7.0 standard drinks    Types: 7 Glasses of wine per week    Comment: social   Drug use: No   Sexual activity: Yes    Partners: Male    Birth control/protection: Post-menopausal  Other Topics Concern   Not on file  Social History Narrative   Not on file   Social Determinants of Health   Financial Resource Strain:  Not on file  Food Insecurity: Not on file  Transportation Needs: Not on file  Physical Activity: Not on file  Stress: Not on file  Social Connections: Not on file   Family History  Problem Relation Age of Onset   Heart disease Mother    Hyperlipidemia Mother    Hypertension Mother    Stroke Mother    No Known Allergies Current Outpatient Medications  Medication Sig Dispense Refill   amLODipine (NORVASC) 10 MG tablet Take 1 tablet (10 mg total) by mouth daily. 90 tablet 1   aspirin EC 325 MG tablet Take 1 tablet (325 mg total) by mouth daily. (Patient not taking: Reported on 11/13/2021) 30 tablet 0   buPROPion (WELLBUTRIN XL) 150 MG 24 hr tablet Take 150 mg by mouth daily.     Coenzyme Q10 (COQ10) 100 MG CAPS Take by mouth daily.     LOSARTAN POTASSIUM PO Take 1 tablet by mouth daily.     mirtazapine (REMERON) 15 MG tablet TAKE 1 TABLET AT BEDTIME 90 tablet 1   Multiple Vitamin (MULTIVITAMIN) tablet Take 1 tablet by mouth daily.     oxyCODONE (OXY IR/ROXICODONE) 5 MG  immediate release tablet Take 1 tablet (5 mg total) by mouth every 4 (four) hours as needed (severe pain). (Patient not taking: Reported on 11/13/2021) 20 tablet 0   Phosphatidylserine 100 MG CAPS Take by mouth.     rosuvastatin (CRESTOR) 20 MG tablet Take 20 mg by mouth daily.     sertraline (ZOLOFT) 50 MG tablet TAKE 1 TABLET EVERY DAY 90 tablet 1   Turmeric (QC TUMERIC COMPLEX PO) Take 553 mg by mouth daily.     vitamin C (ASCORBIC ACID) 500 MG tablet Take 500 mg by mouth daily.     No current facility-administered medications for this visit.   No results found.  Review of Systems:   A ROS was performed including pertinent positives and negatives as documented in the HPI.   Musculoskeletal Exam:    There were no vitals taken for this visit.  Left incisions about the shoulder are well-healed.  There is no erythema or redness.  Passive forward elevation in the supine position is to 120 degrees easily, external  rotation at the side is to 40 degrees again easily.  Internal rotation was deferred.  Sensation is intact all distributions of the left arm.  Fires EPL as well as wrist extensors and flexors.  2+ radial pulse  Imaging:    None  I personally reviewed and interpreted the radiographs.   Assessment:   76 year old female who is 2-week status post left shoulder rotator cuff repair biceps tenodesis overall doing very well.  At this time she will continue to advance per protocol.  I do believe that she would be a candidate for an accelerated rehab protocol with some active assisted range of motion beginning at approximately the 4-week mark.  Plan :    -She will return to clinic in 4 weeks for reassessment      I personally saw and evaluated the patient, and participated in the management and treatment plan.  Huel Cote, MD Attending Physician, Orthopedic Surgery  This document was dictated using Dragon voice recognition software. A reasonable attempt at proof reading has been made to minimize errors.

## 2021-12-01 DIAGNOSIS — M545 Low back pain, unspecified: Secondary | ICD-10-CM | POA: Diagnosis not present

## 2021-12-01 DIAGNOSIS — R200 Anesthesia of skin: Secondary | ICD-10-CM | POA: Diagnosis not present

## 2021-12-01 DIAGNOSIS — M25512 Pain in left shoulder: Secondary | ICD-10-CM | POA: Diagnosis not present

## 2021-12-02 ENCOUNTER — Ambulatory Visit (HOSPITAL_BASED_OUTPATIENT_CLINIC_OR_DEPARTMENT_OTHER): Payer: Medicare PPO | Admitting: Physical Therapy

## 2021-12-02 DIAGNOSIS — M25512 Pain in left shoulder: Secondary | ICD-10-CM | POA: Diagnosis not present

## 2021-12-02 DIAGNOSIS — M6281 Muscle weakness (generalized): Secondary | ICD-10-CM

## 2021-12-02 DIAGNOSIS — R293 Abnormal posture: Secondary | ICD-10-CM

## 2021-12-02 DIAGNOSIS — M75111 Incomplete rotator cuff tear or rupture of right shoulder, not specified as traumatic: Secondary | ICD-10-CM | POA: Diagnosis not present

## 2021-12-02 NOTE — Therapy (Signed)
OUTPATIENT PHYSICAL THERAPY SHOULDER TREATMENT   Patient Name: Emily PeaSheron S Schmuck MRN: [id number] DOB:30-Dec-1945, 76 y.o., female Today's Date: 12/02/2021   PT End of Session - 12/02/21 1552     Visit Number 3    Number of Visits 25    Date for PT Re-Evaluation 02/12/22    Authorization Type [employer] MCR    PT Start Time 1549    PT Stop Time 1625    PT Time Calculation (min) 36 min    Activity Tolerance Patient tolerated treatment well    Behavior During Therapy WFL for tasks assessed/performed              Past Medical History:  Diagnosis Date   Anxiety    Cataract    Mixed OU   Depression    Hyperlipidemia    Hypertension    Hypertensive retinopathy    OU   Left rotator cuff tear    Urine incontinence    Past Surgical History:  Procedure Laterality Date   FACIAL COSMETIC SURGERY     SHOULDER ARTHROSCOPY WITH SUBACROMIAL DECOMPRESSION, ROTATOR CUFF REPAIR AND BICEP TENDON REPAIR Left 11/17/2021   Procedure: LEFT SHOULDER ARTHROSCOPY WITH ROTATOR CUFF REPAIR AND BICEPS TENODESIS;  Surgeon: Huel CoteBokshan, Steven, MD;  Location: [facility];  Service: Orthopedics;  Laterality: Left;   TONSILLECTOMY  1957   tummy tuck     Patient Active Problem List   Diagnosis Date Noted   Biceps tendonosis of left shoulder    Nontraumatic incomplete tear of right rotator cuff    Numbness of toes 11/27/2020   Stage 3b chronic kidney disease (HCC) 11/04/2020   Routine general medical examination at a health care facility 10/31/2020   Visit for screening mammogram 10/28/2020   Primary hypertension 10/28/2020   Hyperlipidemia LDL goal <130 10/28/2020   HIP PAIN 12/10/2008   ILIOTIBIAL BAND SYNDROME 12/10/2008    PCP: Link SnufferHolwerda, MD  REFERRING PROVIDER: Huel CoteBokshan, Steven, MD  REFERRING DIAG: M75.111 (ICD-10-CM) - Nontraumatic incomplete tear of right rotator cuff  THERAPY DIAG:  Acute pain of left shoulder  Muscle weakness (generalized)  Abnormal posture  Rationale  for Evaluation and Treatment Rehabilitation  ONSET DATE: DOS 11/17/21  SUBJECTIVE:                                                                                                                                                                                      SUBJECTIVE STATEMENT: Pt reports she lifted a potted plant (with both arms) 3 days ago, and has been occasionally carrying purse in Lt hand, while in sling. No pain with either of these activities.  Other than that, no new  changes.   PERTINENT HISTORY: none  PAIN:  Are you having pain? No  PRECAUTIONS: Shoulder  WEIGHT BEARING RESTRICTIONS Yes NWB  FALLS:  Has patient fallen in last 6 months? No  LIVING ENVIRONMENT: Lives with: lives with their spouse   OCCUPATION: Not working  PLOF: Independent  PATIENT GOALS aerobics, yoga, walking, I am active and I want to stay that way  OBJECTIVE:   PATIENT SURVEYS:  FOTO 4  UPPER EXTREMITY ROM:   Passive ROM Left eval Left 6/2   Shoulder flexion 90 95  Shoulder extension    Shoulder abduction 45   Shoulder adduction    Shoulder internal rotation    Shoulder external rotation  15 without pushing to end range   Elbow flexion    Elbow extension    Wrist flexion    Wrist extension    Wrist ulnar deviation    Wrist radial deviation    Wrist pronation    Wrist supination    (Blank rows = not tested)  UPPER EXTREMITY MMT:  MMT Right  Left   Shoulder flexion    Shoulder extension    Shoulder abduction    Shoulder adduction    Shoulder internal rotation    Shoulder external rotation    Middle trapezius    Lower trapezius    Elbow flexion    Elbow extension    Wrist flexion    Wrist extension    Wrist ulnar deviation    Wrist radial deviation    Wrist pronation    Wrist supination    Grip strength (lbs)    (Blank rows = not tested)    TODAY'S TREATMENT:     6/7    Seated :shoulder rolls CW    Scap retraction without movement of arm x 3-5 sec x  5 (arm supported on pillow)    Lt forearm pronation/ supination x 5;  verbally reviewed pendulum Manual:  PROM to Lt shoulder - into flex (to tissue limits and no pain), abdct (<60), ER (arm in supported scaption at side) <40; elbow flex / ext  STM to decrease fascial restriction and improve mobility: to Lt periscapular musculature, Lt cervical/upper-mid thoracic paraspinals, gentle to distal biceps brachii and forearm   6/2 PROM flexion and ER in supine Pendulums in all directions x20 each   Reviewed HEP   5/26 PROM in supine, PT removed bandages & replaced with gauze/tegaderm, refit sling.  Scap retraction Passive elbow flexion pendulums    PATIENT EDUCATION: Education details: Anatomy of condition, POC, HEP, exercise form/rationale;  protocol precautions (no lifting)  Person educated: Patient Education method: Explanation, Demonstration, Tactile cues, Verbal cues, and Handouts Education comprehension: verbalized understanding, returned demonstration, verbal cues required, tactile cues required, and needs further education   HOME EXERCISE PROGRAM: [id number]  ASSESSMENT:  CLINICAL IMPRESSION: Pt is 2 wks s/p RCR and biceps tenodesis (11/17/2021). She is tolerating PROM well, with minimal guarding and no pain.  Pt encouraged to maintain surgeons lifting precautions until lift restriction is lifted.  ROM is gradually progressing.  Goals are ongoing.    OBJECTIVE IMPAIRMENTS decreased activity tolerance, decreased ROM, decreased strength, increased muscle spasms, impaired UE functional use, improper body mechanics, postural dysfunction, and pain.   ACTIVITY LIMITATIONS carrying, lifting, bed mobility, bathing, dressing, reach over head, hygiene/grooming, and caring for others  PARTICIPATION LIMITATIONS: meal prep, cleaning, laundry, driving, community activity, and fitness  PERSONAL FACTORS none  REHAB POTENTIAL: Good  CLINICAL DECISION MAKING:  Stable/uncomplicated  EVALUATION COMPLEXITY:  Low   GOALS: Goals reviewed with patient? Yes  SHORT TERM GOALS: Target date: 12/18/2021  PROM to meet stated protocol goals Baseline: see chart Goal status: INITIAL  2.  Resting pain <=3/10 Baseline:  Goal status: INITIAL   LONG TERM GOALS: Target date: 02/12/2022    Shoulder MMT at least 50% of opp UE per dynamometry testing Baseline:  Goal status: INITIAL  2.  Able to perform full overhead motion with 2lb without elevation of shoulder Baseline:  Goal status: INITIAL  3.  Resolution of scapular winging Baseline:  Goal status: INITIAL  4.  Able to perform self care activities without assistance Baseline:  Goal status: INITIAL  5.  Further goals to be set at this date   PLAN: PT FREQUENCY: 1-2x/week  PT DURATION: 12 weeks  PLANNED INTERVENTIONS: Therapeutic exercises, Therapeutic activity, Neuromuscular re-education, Patient/Family education, Joint mobilization, Aquatic Therapy, Dry Needling, Electrical stimulation, Cryotherapy, Moist heat, Taping, Vasopneumatic device, Manual therapy, and Re-evaluation  PLAN FOR NEXT SESSION: continue per protocol   Mayer Camel, PTA 12/02/21 4:39 PM

## 2021-12-09 ENCOUNTER — Ambulatory Visit (HOSPITAL_BASED_OUTPATIENT_CLINIC_OR_DEPARTMENT_OTHER): Payer: Medicare PPO | Admitting: Physical Therapy

## 2021-12-09 DIAGNOSIS — M6281 Muscle weakness (generalized): Secondary | ICD-10-CM | POA: Diagnosis not present

## 2021-12-09 DIAGNOSIS — M75111 Incomplete rotator cuff tear or rupture of right shoulder, not specified as traumatic: Secondary | ICD-10-CM | POA: Diagnosis not present

## 2021-12-09 DIAGNOSIS — M25512 Pain in left shoulder: Secondary | ICD-10-CM

## 2021-12-09 DIAGNOSIS — R293 Abnormal posture: Secondary | ICD-10-CM

## 2021-12-09 NOTE — Therapy (Signed)
OUTPATIENT PHYSICAL THERAPY SHOULDER TREATMENT   Patient Name: Emily Reid MRN: [id number] DOB:04-06-1946, 76 y.o., female Today's Date: 12/09/2021      Past Medical History:  Diagnosis Date   Anxiety    Cataract    Mixed OU   Depression    Hyperlipidemia    Hypertension    Hypertensive retinopathy    OU   Left rotator cuff tear    Urine incontinence    Past Surgical History:  Procedure Laterality Date   FACIAL COSMETIC SURGERY     SHOULDER ARTHROSCOPY WITH SUBACROMIAL DECOMPRESSION, ROTATOR CUFF REPAIR AND BICEP TENDON REPAIR Left 11/17/2021   Procedure: LEFT SHOULDER ARTHROSCOPY WITH ROTATOR CUFF REPAIR AND BICEPS TENODESIS;  Surgeon: Huel Cote, MD;  Location: [facility];  Service: Orthopedics;  Laterality: Left;   TONSILLECTOMY  1957   tummy tuck     Patient Active Problem List   Diagnosis Date Noted   Biceps tendonosis of left shoulder    Nontraumatic incomplete tear of right rotator cuff    Numbness of toes 11/27/2020   Stage 3b chronic kidney disease (HCC) 11/04/2020   Routine general medical examination at a health care facility 10/31/2020   Visit for screening mammogram 10/28/2020   Primary hypertension 10/28/2020   Hyperlipidemia LDL goal <130 10/28/2020   HIP PAIN 12/10/2008   ILIOTIBIAL BAND SYNDROME 12/10/2008    PCP: Link Snuffer, MD  REFERRING PROVIDER: Huel Cote, MD  REFERRING DIAG: M75.111 (ICD-10-CM) - Nontraumatic incomplete tear of right rotator cuff  THERAPY DIAG:  Acute pain of left shoulder  Muscle weakness (generalized)  Abnormal posture  Rationale for Evaluation and Treatment Rehabilitation  ONSET DATE: DOS 11/17/21  SUBJECTIVE:                                                                                                                                                                                      SUBJECTIVE STATEMENT: Pt reports she has no pain or soreness today or following previous session.  Pt had MD visit on 6/5 - good progress so far and deemed a candidate for accelerated rehab (AAROM indicated at the 4-week post-op mark).  PERTINENT HISTORY: none  PAIN:  Are you having pain? No  PRECAUTIONS: Shoulder  WEIGHT BEARING RESTRICTIONS Yes NWB  FALLS:  Has patient fallen in last 6 months? No  LIVING ENVIRONMENT: Lives with: lives with their spouse   OCCUPATION: Not working  PLOF: Independent  PATIENT GOALS aerobics, yoga, walking, I am active and I want to stay that way  OBJECTIVE:   PATIENT SURVEYS:  FOTO 4  UPPER EXTREMITY ROM:   Passive ROM Left eval Left 6/2  Shoulder flexion 90 95  Shoulder extension    Shoulder abduction 45   Shoulder adduction    Shoulder internal rotation    Shoulder external rotation  15 without pushing to end range   Elbow flexion    Elbow extension    Wrist flexion    Wrist extension    Wrist ulnar deviation    Wrist radial deviation    Wrist pronation    Wrist supination    (Blank rows = not tested)  UPPER EXTREMITY MMT:  MMT Right  Left   Shoulder flexion    Shoulder extension    Shoulder abduction    Shoulder adduction    Shoulder internal rotation    Shoulder external rotation    Middle trapezius    Lower trapezius    Elbow flexion    Elbow extension    Wrist flexion    Wrist extension    Wrist ulnar deviation    Wrist radial deviation    Wrist pronation    Wrist supination    Grip strength (lbs)    (Blank rows = not tested)    TODAY'S TREATMENT:        6/14   Pendulums   Manual: PROM flexion, abduction, and ER (arm by side) gentle to end limits within protocol Trigger point release to the upper trap   Reviewed precautions      6/7   Seated :shoulder rolls CW   Scap retraction without movement of arm x 3-5 sec x 5 (arm supported on pillow)   Lt forearm pronation/ supination x 5;  verbally reviewed pendulum Manual:  PROM to Lt shoulder - into flex (to tissue limits and no pain), abdct  (<60), ER (arm in supported scaption at side) <40; elbow flex / ext  STM to decrease fascial restriction and improve mobility: to Lt periscapular musculature, Lt cervical/upper-mid thoracic paraspinals, gentle to distal biceps brachii and forearm   6/2 PROM flexion and ER in supine Pendulums in all directions x20 each    Reviewed HEP     5/26 PROM in supine, PT removed bandages & replaced with gauze/tegaderm, refit sling.  Scap retraction Passive elbow flexion pendulums    PATIENT EDUCATION: Education details: Anatomy of condition, POC, HEP, exercise form/rationale;  protocol precautions (no lifting)  Person educated: Patient Education method: Explanation, Demonstration, Tactile cues, Verbal cues, and Handouts Education comprehension: verbalized understanding, returned demonstration, verbal cues required, tactile cues required, and needs further education   HOME EXERCISE PROGRAM: [id number]  ASSESSMENT:  CLINICAL IMPRESSION: Pt is now 3 weeks post-op and is tolerated all treatment well. Pt has no pain, minimal guarding, and empty end-feels at all end ranges as indicated in protocol. Pt encouraged to maintain restrictions and will be advanced per MD recommendation at next visit. Per protocol she can begin AAROM on week 4 if she is doing well.     OBJECTIVE IMPAIRMENTS decreased activity tolerance, decreased ROM, decreased strength, increased muscle spasms, impaired UE functional use, improper body mechanics, postural dysfunction, and pain.   ACTIVITY LIMITATIONS carrying, lifting, bed mobility, bathing, dressing, reach over head, hygiene/grooming, and caring for others  PARTICIPATION LIMITATIONS: meal prep, cleaning, laundry, driving, community activity, and fitness  PERSONAL FACTORS none  REHAB POTENTIAL: Good  CLINICAL DECISION MAKING: Stable/uncomplicated  EVALUATION COMPLEXITY: Low   GOALS: Goals reviewed with patient? Yes  SHORT TERM GOALS: Target date:  12/18/2021  PROM to meet stated protocol goals Baseline: see chart Goal status: INITIAL  2.  Resting pain <=  3/10 Baseline:  Goal status: INITIAL   LONG TERM GOALS: Target date: 02/12/2022    Shoulder MMT at least 50% of opp UE per dynamometry testing Baseline:  Goal status: INITIAL  2.  Able to perform full overhead motion with 2lb without elevation of shoulder Baseline:  Goal status: INITIAL  3.  Resolution of scapular winging Baseline:  Goal status: INITIAL  4.  Able to perform self care activities without assistance Baseline:  Goal status: INITIAL  5.  Further goals to be set at this date   PLAN: PT FREQUENCY: 1-2x/week  PT DURATION: 12 weeks  PLANNED INTERVENTIONS: Therapeutic exercises, Therapeutic activity, Neuromuscular re-education, Patient/Family education, Joint mobilization, Aquatic Therapy, Dry Needling, Electrical stimulation, Cryotherapy, Moist heat, Taping, Vasopneumatic device, Manual therapy, and Re-evaluation  PLAN FOR NEXT SESSION: Advanced rehab protocol - gentle AAROM exercise. Continue with all other treatments.   Illene Labradorave Kaiyon Hynes PT DPT  12/09/21 1:55 PM  Lysle MoralesMathew Hamlet SPT  During this treatment session, the therapist was present, participating in and directing the treatment.

## 2021-12-11 ENCOUNTER — Other Ambulatory Visit: Payer: Self-pay | Admitting: Internal Medicine

## 2021-12-11 DIAGNOSIS — Z1231 Encounter for screening mammogram for malignant neoplasm of breast: Secondary | ICD-10-CM

## 2021-12-21 ENCOUNTER — Ambulatory Visit (HOSPITAL_BASED_OUTPATIENT_CLINIC_OR_DEPARTMENT_OTHER): Payer: Medicare PPO | Admitting: Physical Therapy

## 2021-12-21 ENCOUNTER — Encounter (HOSPITAL_BASED_OUTPATIENT_CLINIC_OR_DEPARTMENT_OTHER): Payer: Self-pay | Admitting: Physical Therapy

## 2021-12-21 DIAGNOSIS — M6281 Muscle weakness (generalized): Secondary | ICD-10-CM | POA: Diagnosis not present

## 2021-12-21 DIAGNOSIS — R293 Abnormal posture: Secondary | ICD-10-CM | POA: Diagnosis not present

## 2021-12-21 DIAGNOSIS — M25512 Pain in left shoulder: Secondary | ICD-10-CM

## 2021-12-21 DIAGNOSIS — M75111 Incomplete rotator cuff tear or rupture of right shoulder, not specified as traumatic: Secondary | ICD-10-CM | POA: Diagnosis not present

## 2021-12-22 ENCOUNTER — Encounter (HOSPITAL_BASED_OUTPATIENT_CLINIC_OR_DEPARTMENT_OTHER): Payer: Self-pay | Admitting: Physical Therapy

## 2021-12-22 DIAGNOSIS — R200 Anesthesia of skin: Secondary | ICD-10-CM | POA: Diagnosis not present

## 2021-12-22 DIAGNOSIS — M25512 Pain in left shoulder: Secondary | ICD-10-CM | POA: Diagnosis not present

## 2021-12-22 DIAGNOSIS — M545 Low back pain, unspecified: Secondary | ICD-10-CM | POA: Diagnosis not present

## 2021-12-24 ENCOUNTER — Ambulatory Visit (HOSPITAL_BASED_OUTPATIENT_CLINIC_OR_DEPARTMENT_OTHER): Payer: Medicare PPO | Admitting: Physical Therapy

## 2021-12-24 DIAGNOSIS — R293 Abnormal posture: Secondary | ICD-10-CM | POA: Diagnosis not present

## 2021-12-24 DIAGNOSIS — M75111 Incomplete rotator cuff tear or rupture of right shoulder, not specified as traumatic: Secondary | ICD-10-CM | POA: Diagnosis not present

## 2021-12-24 DIAGNOSIS — M25512 Pain in left shoulder: Secondary | ICD-10-CM

## 2021-12-24 DIAGNOSIS — M6281 Muscle weakness (generalized): Secondary | ICD-10-CM | POA: Diagnosis not present

## 2021-12-24 NOTE — Therapy (Signed)
OUTPATIENT PHYSICAL THERAPY SHOULDER TREATMENT   Patient Name: Emily Reid MRN: [id number] DOB:04/25/1946, 76 y.o., female Today's Date: 12/25/2021   PT End of Session - 12/25/21 7829     Visit Number 6    Number of Visits 25    Date for PT Re-Evaluation 02/12/22    Authorization Type [employer] MCR    PT Start Time 1515    PT Stop Time 1545    PT Time Calculation (min) 30 min    Activity Tolerance Patient tolerated treatment well    Behavior During Therapy WFL for tasks assessed/performed                Past Medical History:  Diagnosis Date   Anxiety    Cataract    Mixed OU   Depression    Hyperlipidemia    Hypertension    Hypertensive retinopathy    OU   Left rotator cuff tear    Urine incontinence    Past Surgical History:  Procedure Laterality Date   FACIAL COSMETIC SURGERY     SHOULDER ARTHROSCOPY WITH SUBACROMIAL DECOMPRESSION, ROTATOR CUFF REPAIR AND BICEP TENDON REPAIR Left 11/17/2021   Procedure: LEFT SHOULDER ARTHROSCOPY WITH ROTATOR CUFF REPAIR AND BICEPS TENODESIS;  Surgeon: Huel Cote, MD;  Location: [facility];  Service: Orthopedics;  Laterality: Left;   TONSILLECTOMY  1957   tummy tuck     Patient Active Problem List   Diagnosis Date Noted   Biceps tendonosis of left shoulder    Nontraumatic incomplete tear of right rotator cuff    Numbness of toes 11/27/2020   Stage 3b chronic kidney disease (HCC) 11/04/2020   Routine general medical examination at a health care facility 10/31/2020   Visit for screening mammogram 10/28/2020   Primary hypertension 10/28/2020   Hyperlipidemia LDL goal <130 10/28/2020   HIP PAIN 12/10/2008   ILIOTIBIAL BAND SYNDROME 12/10/2008    PCP: Link Snuffer, MD  REFERRING PROVIDER: Huel Cote, MD  REFERRING DIAG: M75.111 (ICD-10-CM) - Nontraumatic incomplete tear of right rotator cuff  THERAPY DIAG:  Acute pain of left shoulder  Muscle weakness (generalized)  Abnormal  posture  Rationale for Evaluation and Treatment Rehabilitation  ONSET DATE: DOS 11/17/21  SUBJECTIVE:                                                                                                                                                                                      SUBJECTIVE STATEMENT: Patient continues to have very little pain. She has been doing the exercises that she can.  PERTINENT HISTORY: none  PAIN:  Are you having pain? No  PRECAUTIONS: Shoulder  WEIGHT BEARING RESTRICTIONS Yes  NWB  FALLS:  Has patient fallen in last 6 months? No  LIVING ENVIRONMENT: Lives with: lives with their spouse   OCCUPATION: Not working  PLOF: Independent  PATIENT GOALS aerobics, yoga, walking, I am active and I want to stay that way  OBJECTIVE:   PATIENT SURVEYS:  FOTO 4  UPPER EXTREMITY ROM:   Passive ROM Left eval Left 6/2   Shoulder flexion 90 95  Shoulder extension    Shoulder abduction 45   Shoulder adduction    Shoulder internal rotation    Shoulder external rotation  15 without pushing to end range   Elbow flexion    Elbow extension    Wrist flexion    Wrist extension    Wrist ulnar deviation    Wrist radial deviation    Wrist pronation    Wrist supination    (Blank rows = not tested)  UPPER EXTREMITY MMT:  MMT Right  Left   Shoulder flexion    Shoulder extension    Shoulder abduction    Shoulder adduction    Shoulder internal rotation    Shoulder external rotation    Middle trapezius    Lower trapezius    Elbow flexion    Elbow extension    Wrist flexion    Wrist extension    Wrist ulnar deviation    Wrist radial deviation    Wrist pronation    Wrist supination    Grip strength (lbs)    (Blank rows = not tested)    TODAY'S TREATMENT:   6/29  Pulleys flexion 2 min  Manual: PROM flexion, abduction, and ER (arm by side) gentle to end limits within protocol  Wand ER AAROM, x10 1lb Wand Press AAROM, x10 1lb Wand flexion  AAROM, x10 1lb    6/26 Pulleys flexion 2 min  Manual: PROM flexion, abduction, and ER (arm by side) gentle to end limits within protocol  Wand ER AAROM, x10 1lb Wand Press AAROM, x10 1lb Wand flexion AAROM, x10 1lb Shoulder retractions, x10  Extensive cuing for form and education not to push anything too far.      6/14   Pendulums   Manual: PROM flexion, abduction, and ER (arm by side) gentle to end limits within protocol Trigger point release to the upper trap   Reviewed precautions      6/7   Seated :shoulder rolls CW   Scap retraction without movement of arm x 3-5 sec x 5 (arm supported on pillow)   Lt forearm pronation/ supination x 5;  verbally reviewed pendulum Manual:  PROM to Lt shoulder - into flex (to tissue limits and no pain), abdct (<60), ER (arm in supported scaption at side) <40; elbow flex / ext  STM to decrease fascial restriction and improve mobility: to Lt periscapular musculature, Lt cervical/upper-mid thoracic paraspinals, gentle to distal biceps brachii and forearm      PATIENT EDUCATION: Education details: HEP progression, pulleys, AAROM exercises  Person educated: Patient Education method: Explanation, Demonstration, Tactile cues, Verbal cues, and Handouts Education comprehension: verbalized understanding, returned demonstration, verbal cues required, tactile cues required, and needs further education   HOME EXERCISE PROGRAM: [id number]  ASSESSMENT:  CLINICAL IMPRESSION: Pt is now 4 weeks post-op and is tolerated all treatment well. The patient continues to make excellent progress. She was able to do all allowed exercises with no increase in pain. Her range is past protocol limits. We will reduce her frequency to 1x a week and possibly 1x every other week  until we can progress protocol. She was advised to move her shoulder at home but not to over stress it 2nd to being past her protocol limits.  OBJECTIVE IMPAIRMENTS decreased activity  tolerance, decreased ROM, decreased strength, increased muscle spasms, impaired UE functional use, improper body mechanics, postural dysfunction, and pain.   ACTIVITY LIMITATIONS carrying, lifting, bed mobility, bathing, dressing, reach over head, hygiene/grooming, and caring for others  PARTICIPATION LIMITATIONS: meal prep, cleaning, laundry, driving, community activity, and fitness  PERSONAL FACTORS none  REHAB POTENTIAL: Good  CLINICAL DECISION MAKING: Stable/uncomplicated  EVALUATION COMPLEXITY: Low   GOALS: Goals reviewed with patient? Yes  SHORT TERM GOALS: Target date: 12/18/2021  PROM to meet stated protocol goals Baseline: see chart Goal status: INITIAL  2.  Resting pain <=3/10 Baseline:  Goal status: INITIAL   LONG TERM GOALS: Target date: 02/12/2022    Shoulder MMT at least 50% of opp UE per dynamometry testing Baseline:  Goal status: INITIAL  2.  Able to perform full overhead motion with 2lb without elevation of shoulder Baseline:  Goal status: INITIAL  3.  Resolution of scapular winging Baseline:  Goal status: INITIAL  4.  Able to perform self care activities without assistance Baseline:  Goal status: INITIAL  5.  Further goals to be set at this date   PLAN: PT FREQUENCY: 1-2x/week  PT DURATION: 12 weeks  PLANNED INTERVENTIONS: Therapeutic exercises, Therapeutic activity, Neuromuscular re-education, Patient/Family education, Joint mobilization, Aquatic Therapy, Dry Needling, Electrical stimulation, Cryotherapy, Moist heat, Taping, Vasopneumatic device, Manual therapy, and Re-evaluation  PLAN FOR NEXT SESSION: Advanced rehab protocol - gentle AAROM exercise. Continue with all other treatments.   Illene Labrador PT DPT  12/25/21 6:40 AM  Andree Moro SPT  During this treatment session, the therapist was present, participating in and directing the treatment.

## 2021-12-25 ENCOUNTER — Encounter (HOSPITAL_BASED_OUTPATIENT_CLINIC_OR_DEPARTMENT_OTHER): Payer: Medicare PPO | Admitting: Physical Therapy

## 2021-12-25 ENCOUNTER — Encounter (HOSPITAL_BASED_OUTPATIENT_CLINIC_OR_DEPARTMENT_OTHER): Payer: Self-pay | Admitting: Physical Therapy

## 2021-12-28 ENCOUNTER — Ambulatory Visit (INDEPENDENT_AMBULATORY_CARE_PROVIDER_SITE_OTHER): Payer: Medicare PPO | Admitting: Orthopaedic Surgery

## 2021-12-28 DIAGNOSIS — M75111 Incomplete rotator cuff tear or rupture of right shoulder, not specified as traumatic: Secondary | ICD-10-CM

## 2021-12-28 NOTE — Progress Notes (Signed)
Post Operative Evaluation    Procedure/Date of Surgery: Left shoulder rotator cuff repair and biceps tenodesis 11/17/21  Interval History:    Presents today 6 weeks status post left shoulder rotator cuff repair and biceps tenodesis.  Overall she is doing very well.  She has been working with physical therapy multiple times weekly.  Range of motion is dramatically improved.  She is here today for further assessment.  She has been compliant with sling usage.  Overall she has no pain involving the left shoulder.   PMH/PSH/Family History/Social History/Meds/Allergies:    Past Medical History:  Diagnosis Date   Anxiety    Cataract    Mixed OU   Depression    Hyperlipidemia    Hypertension    Hypertensive retinopathy    OU   Left rotator cuff tear    Urine incontinence    Past Surgical History:  Procedure Laterality Date   FACIAL COSMETIC SURGERY     SHOULDER ARTHROSCOPY WITH SUBACROMIAL DECOMPRESSION, ROTATOR CUFF REPAIR AND BICEP TENDON REPAIR Left 11/17/2021   Procedure: LEFT SHOULDER ARTHROSCOPY WITH ROTATOR CUFF REPAIR AND BICEPS TENODESIS;  Surgeon: Huel Cote, MD;  Location: [facility];  Service: Orthopedics;  Laterality: Left;   TONSILLECTOMY  1957   tummy tuck     Social History   Socioeconomic History   Marital status: Married    Spouse name: Not on file   Number of children: Not on file   Years of education: Not on file   Highest education level: Not on file  Occupational History   Not on file  Tobacco Use   Smoking status: Never   Smokeless tobacco: Never  Vaping Use   Vaping Use: Never used  Substance and Sexual Activity   Alcohol use: Yes    Alcohol/week: 7.0 standard drinks of alcohol    Types: 7 Glasses of wine per week    Comment: social   Drug use: No   Sexual activity: Yes    Partners: Male    Birth control/protection: Post-menopausal  Other Topics Concern   Not on file  Social History  Narrative   Not on file   Social Determinants of Health   Financial Resource Strain: Not on file  Food Insecurity: Not on file  Transportation Needs: Not on file  Physical Activity: Not on file  Stress: Not on file  Social Connections: Not on file   Family History  Problem Relation Age of Onset   Heart disease Mother    Hyperlipidemia Mother    Hypertension Mother    Stroke Mother    No Known Allergies Current Outpatient Medications  Medication Sig Dispense Refill   amLODipine (NORVASC) 10 MG tablet Take 1 tablet (10 mg total) by mouth daily. 90 tablet 1   aspirin EC 325 MG tablet Take 1 tablet (325 mg total) by mouth daily. (Patient not taking: Reported on 11/13/2021) 30 tablet 0   buPROPion (WELLBUTRIN XL) 150 MG 24 hr tablet Take 150 mg by mouth daily.     Coenzyme Q10 (COQ10) 100 MG CAPS Take by mouth daily.     LOSARTAN POTASSIUM PO Take 1 tablet by mouth daily.     mirtazapine (REMERON) 15 MG tablet TAKE 1 TABLET AT BEDTIME 90 tablet 1   Multiple Vitamin (MULTIVITAMIN) tablet Take 1 tablet  by mouth daily.     oxyCODONE (OXY IR/ROXICODONE) 5 MG immediate release tablet Take 1 tablet (5 mg total) by mouth every 4 (four) hours as needed (severe pain). (Patient not taking: Reported on 11/13/2021) 20 tablet 0   Phosphatidylserine 100 MG CAPS Take by mouth.     rosuvastatin (CRESTOR) 20 MG tablet Take 20 mg by mouth daily.     sertraline (ZOLOFT) 50 MG tablet TAKE 1 TABLET EVERY DAY 90 tablet 1   Turmeric (QC TUMERIC COMPLEX PO) Take 553 mg by mouth daily.     vitamin C (ASCORBIC ACID) 500 MG tablet Take 500 mg by mouth daily.     No current facility-administered medications for this visit.   No results found.  Review of Systems:   A ROS was performed including pertinent positives and negatives as documented in the HPI.   Musculoskeletal Exam:    There were no vitals taken for this visit.  Left incisions about the shoulder are well-healed.  Left shoulder active range of  motion is to approximately 160 degrees compared to 170 on the right.  External rotation at the side is to 60 degrees bilaterally.  Internal rotation is to L1 bilaterally.  Imaging:    None  I personally reviewed and interpreted the radiographs.   Assessment:   76 year old female who is 6-week status post left shoulder rotator cuff repair biceps tenodesis overall doing very well.  At this time she will continue to progress through the rehab protocol.  She may discontinue her sling.  She will begin the strengthening portion of the protocol.  I will see her back in 6 weeks for reassessment  Plan :    -She will return to clinic in 6 weeks for reassessment      I personally saw and evaluated the patient, and participated in the management and treatment plan.  Huel Cote, MD Attending Physician, Orthopedic Surgery  This document was dictated using Dragon voice recognition software. A reasonable attempt at proof reading has been made to minimize errors.

## 2021-12-29 DIAGNOSIS — M25552 Pain in left hip: Secondary | ICD-10-CM | POA: Diagnosis not present

## 2021-12-30 ENCOUNTER — Ambulatory Visit (HOSPITAL_BASED_OUTPATIENT_CLINIC_OR_DEPARTMENT_OTHER): Payer: Medicare PPO | Attending: Orthopaedic Surgery | Admitting: Physical Therapy

## 2021-12-30 ENCOUNTER — Encounter (HOSPITAL_BASED_OUTPATIENT_CLINIC_OR_DEPARTMENT_OTHER): Payer: Self-pay | Admitting: Physical Therapy

## 2021-12-30 DIAGNOSIS — R293 Abnormal posture: Secondary | ICD-10-CM | POA: Insufficient documentation

## 2021-12-30 DIAGNOSIS — M25512 Pain in left shoulder: Secondary | ICD-10-CM | POA: Diagnosis not present

## 2021-12-30 DIAGNOSIS — M6281 Muscle weakness (generalized): Secondary | ICD-10-CM | POA: Diagnosis not present

## 2021-12-30 NOTE — Therapy (Signed)
OUTPATIENT PHYSICAL THERAPY SHOULDER TREATMENT   Patient Name: Emily Reid MRN: [id number] DOB:Jan 08, 1946, 76 y.o., female Today's Date: 12/30/2021   PT End of Session - 12/30/21 1519     Visit Number 7    Number of Visits 25    Date for PT Re-Evaluation 02/12/22    Authorization Type [employer] MCR    PT Start Time 1519    PT Stop Time 8453    PT Time Calculation (min) 28 min    Activity Tolerance Patient tolerated treatment well    Behavior During Therapy WFL for tasks assessed/performed                Past Medical History:  Diagnosis Date   Anxiety    Cataract    Mixed OU   Depression    Hyperlipidemia    Hypertension    Hypertensive retinopathy    OU   Left rotator cuff tear    Urine incontinence    Past Surgical History:  Procedure Laterality Date   FACIAL COSMETIC SURGERY     SHOULDER ARTHROSCOPY WITH SUBACROMIAL DECOMPRESSION, ROTATOR CUFF REPAIR AND BICEP TENDON REPAIR Left 11/17/2021   Procedure: LEFT SHOULDER ARTHROSCOPY WITH ROTATOR CUFF REPAIR AND BICEPS TENODESIS;  Surgeon: Vanetta Mulders, MD;  Location: [facility];  Service: Orthopedics;  Laterality: Left;   TONSILLECTOMY  1957   tummy tuck     Patient Active Problem List   Diagnosis Date Noted   Biceps tendonosis of left shoulder    Nontraumatic incomplete tear of right rotator cuff    Numbness of toes 11/27/2020   Stage 3b chronic kidney disease ([place]) 11/04/2020   Routine general medical examination at a health care facility 10/31/2020   Visit for screening mammogram 10/28/2020   Primary hypertension 10/28/2020   Hyperlipidemia LDL goal <130 10/28/2020   HIP PAIN 12/10/2008   ILIOTIBIAL BAND SYNDROME 12/10/2008    PCP: Ardeth Perfect, MD  REFERRING PROVIDER: Vanetta Mulders, MD  REFERRING DIAG: M75.111 (ICD-10-CM) - Nontraumatic incomplete tear of right rotator cuff  THERAPY DIAG:  Acute pain of left shoulder  Muscle weakness (generalized)  Abnormal  posture  Rationale for Evaluation and Treatment Rehabilitation  ONSET DATE: DOS 11/17/21  SUBJECTIVE:                                                                                                                                                                                      SUBJECTIVE STATEMENT: Pt reports she is trying to use her arm more. (She motions moving LUE overhead and out to side).  Not lifting anything heavy. She'd like to know what she should avoid with her LUE.  She will be out of town for 2 wks.   PERTINENT HISTORY: none  PAIN:  Are you having pain? No  PRECAUTIONS: Shoulder  WEIGHT BEARING RESTRICTIONS Yes NWB  FALLS:  Has patient fallen in last 6 months? No  LIVING ENVIRONMENT: Lives with: lives with their spouse   OCCUPATION: Not working  PLOF: Independent  PATIENT GOALS aerobics, yoga, walking, I am active and I want to stay that way  OBJECTIVE:   PATIENT SURVEYS:  FOTO 4  UPPER EXTREMITY ROM:   Passive ROM Left eval Left 6/2   Shoulder flexion 90 95  Shoulder extension    Shoulder abduction 45   Shoulder adduction    Shoulder internal rotation    Shoulder external rotation  15 without pushing to end range   Elbow flexion    Elbow extension    Wrist flexion    Wrist extension    Wrist ulnar deviation    Wrist radial deviation    Wrist pronation    Wrist supination    (Blank rows = not tested)  UPPER EXTREMITY MMT:  MMT Right  Left   Shoulder flexion    Shoulder extension    Shoulder abduction    Shoulder adduction    Shoulder internal rotation    Shoulder external rotation    Middle trapezius    Lower trapezius    Elbow flexion    Elbow extension    Wrist flexion    Wrist extension    Wrist ulnar deviation    Wrist radial deviation    Wrist pronation    Wrist supination    Grip strength (lbs)    (Blank rows = not tested)    TODAY'S TREATMENT:   7/5 Pulleys flexion 2 min, gentle scaption 1 min  Seated:  Wand ER AAROM, x10 (elbow propped on pillow) Seated: W's (scap squeeze with gentle ER) x 10  Standing in front of mirror: AROM with LUE flexion and scaption below 70 (watching for scap hiking) Rolling small ball up wall into shoulder flexion x 5 Shoulder rolls Lateral neck flexion R/L Holding small ball with 2 hands:  -(bilat shoulders flexed ~40) with pronation / supination of forearms       -Elbows neutral to elbow flexion to shoulder flexion to raise ball to eye height -Scap squeeze with ball to diagonal motion (elbow flexion, ball towards shoulder/ elbow ext, ball to hip Holding small ball with L hand - moving just past neutral into ext   6/29  Pulleys flexion 2 min  Manual: PROM flexion, abduction, and ER (arm by side) gentle to end limits within protocol  Wand ER AAROM, x10 1lb Wand Press AAROM, x10 1lb Wand flexion AAROM, x10 1lb  6/26 Pulleys flexion 2 min  Manual: PROM flexion, abduction, and ER (arm by side) gentle to end limits within protocol  Wand ER AAROM, x10 1lb Wand Press AAROM, x10 1lb Wand flexion AAROM, x10 1lb Shoulder retractions, x10  Extensive cuing for form and education not to push anything too far.      6/14   Pendulums   Manual: PROM flexion, abduction, and ER (arm by side) gentle to end limits within protocol Trigger point release to the upper trap   Reviewed precautions      6/7   Seated :shoulder rolls CW   Scap retraction without movement of arm x 3-5 sec x 5 (arm supported on pillow)   Lt forearm pronation/ supination x 5;  verbally reviewed pendulum Manual:  PROM to Lt shoulder - into flex (to tissue limits and no pain), abdct (<60), ER (arm in supported scaption at side) <40; elbow flex / ext  STM to decrease fascial restriction and improve mobility: to Lt periscapular musculature, Lt cervical/upper-mid thoracic paraspinals, gentle to distal biceps brachii and forearm      PATIENT EDUCATION: Education details: HEP progression,  pulleys, AAROM exercises  Person educated: Patient Education method: Explanation, Demonstration, Tactile cues, Verbal cues, and Handouts Education comprehension: verbalized understanding, returned demonstration, verbal cues required, tactile cues required, and needs further education   HOME EXERCISE PROGRAM: [id number]  ASSESSMENT:  CLINICAL IMPRESSION: Pt is now 6 weeks post-op and is tolerating all treatment well. Worked on [employer] to AROM per protocol within pain-free range.  Reviewed rehab protocol restrictions and limitations, including NWB in LUE; pt verbalized understanding. Pt will be out of town for 2 wks.  Pt progressing well towards all goals, within rehab parameters. She has met STG #2.    OBJECTIVE IMPAIRMENTS decreased activity tolerance, decreased ROM, decreased strength, increased muscle spasms, impaired UE functional use, improper body mechanics, postural dysfunction, and pain.   ACTIVITY LIMITATIONS carrying, lifting, bed mobility, bathing, dressing, reach over head, hygiene/grooming, and caring for others  PARTICIPATION LIMITATIONS: meal prep, cleaning, laundry, driving, community activity, and fitness  PERSONAL FACTORS none  REHAB POTENTIAL: Good  CLINICAL DECISION MAKING: Stable/uncomplicated  EVALUATION COMPLEXITY: Low   GOALS: Goals reviewed with patient? Yes  SHORT TERM GOALS: Target date: 12/18/2021  PROM to meet stated protocol goals Baseline: see chart Goal status: INITIAL  2.  Resting pain <=3/10 Baseline:  Goal status: Achieved   LONG TERM GOALS: Target date: 02/12/2022    Shoulder MMT at least 50% of opp UE per dynamometry testing Baseline:  Goal status: INITIAL  2.  Able to perform full overhead motion with 2lb without elevation of shoulder Baseline:  Goal status: INITIAL  3.  Resolution of scapular winging Baseline:  Goal status: INITIAL  4.  Able to perform self care activities without assistance Baseline:  Goal status:  INITIAL  5.  Further goals to be set at this date   PLAN: PT FREQUENCY: 1-2x/week  PT DURATION: 12 weeks  PLANNED INTERVENTIONS: Therapeutic exercises, Therapeutic activity, Neuromuscular re-education, Patient/Family education, Joint mobilization, Aquatic Therapy, Dry Needling, Electrical stimulation, Cryotherapy, Moist heat, Taping, Vasopneumatic device, Manual therapy, and Re-evaluation  PLAN FOR NEXT SESSION: Advanced rehab per protocol.   Kerin Perna, PTA 12/30/21 4:27 PM

## 2021-12-31 DIAGNOSIS — M545 Low back pain, unspecified: Secondary | ICD-10-CM | POA: Diagnosis not present

## 2021-12-31 DIAGNOSIS — R200 Anesthesia of skin: Secondary | ICD-10-CM | POA: Diagnosis not present

## 2021-12-31 DIAGNOSIS — M25512 Pain in left shoulder: Secondary | ICD-10-CM | POA: Diagnosis not present

## 2022-01-04 ENCOUNTER — Encounter (HOSPITAL_BASED_OUTPATIENT_CLINIC_OR_DEPARTMENT_OTHER): Payer: Medicare PPO | Admitting: Physical Therapy

## 2022-01-06 ENCOUNTER — Encounter (HOSPITAL_BASED_OUTPATIENT_CLINIC_OR_DEPARTMENT_OTHER): Payer: Medicare PPO | Admitting: Physical Therapy

## 2022-01-08 ENCOUNTER — Encounter (HOSPITAL_BASED_OUTPATIENT_CLINIC_OR_DEPARTMENT_OTHER): Payer: Medicare PPO | Admitting: Physical Therapy

## 2022-01-11 DIAGNOSIS — M25512 Pain in left shoulder: Secondary | ICD-10-CM | POA: Diagnosis not present

## 2022-01-11 DIAGNOSIS — M545 Low back pain, unspecified: Secondary | ICD-10-CM | POA: Diagnosis not present

## 2022-01-11 DIAGNOSIS — R200 Anesthesia of skin: Secondary | ICD-10-CM | POA: Diagnosis not present

## 2022-01-18 ENCOUNTER — Encounter (HOSPITAL_BASED_OUTPATIENT_CLINIC_OR_DEPARTMENT_OTHER): Payer: Self-pay | Admitting: Physical Therapy

## 2022-01-18 ENCOUNTER — Ambulatory Visit (HOSPITAL_BASED_OUTPATIENT_CLINIC_OR_DEPARTMENT_OTHER): Payer: Medicare PPO | Admitting: Physical Therapy

## 2022-01-18 DIAGNOSIS — M25512 Pain in left shoulder: Secondary | ICD-10-CM

## 2022-01-18 DIAGNOSIS — R293 Abnormal posture: Secondary | ICD-10-CM | POA: Diagnosis not present

## 2022-01-18 DIAGNOSIS — M6281 Muscle weakness (generalized): Secondary | ICD-10-CM | POA: Diagnosis not present

## 2022-01-18 NOTE — Therapy (Signed)
OUTPATIENT PHYSICAL THERAPY SHOULDER TREATMENT   Patient Name: Emily Reid MRN: [id number] DOB:1946-01-19, 76 y.o., female Today's Date: 01/18/2022   PT End of Session - 01/18/22 1603     Visit Number 8    Number of Visits 25    Date for PT Re-Evaluation 02/12/22    Authorization Type [employer] MCR    PT Start Time 1600    PT Stop Time 1642    PT Time Calculation (min) 42 min    Activity Tolerance Patient tolerated treatment well    Behavior During Therapy WFL for tasks assessed/performed                Past Medical History:  Diagnosis Date   Anxiety    Cataract    Mixed OU   Depression    Hyperlipidemia    Hypertension    Hypertensive retinopathy    OU   Left rotator cuff tear    Urine incontinence    Past Surgical History:  Procedure Laterality Date   FACIAL COSMETIC SURGERY     SHOULDER ARTHROSCOPY WITH SUBACROMIAL DECOMPRESSION, ROTATOR CUFF REPAIR AND BICEP TENDON REPAIR Left 11/17/2021   Procedure: LEFT SHOULDER ARTHROSCOPY WITH ROTATOR CUFF REPAIR AND BICEPS TENODESIS;  Surgeon: Huel Cote, MD;  Location: [facility];  Service: Orthopedics;  Laterality: Left;   TONSILLECTOMY  1957   tummy tuck     Patient Active Problem List   Diagnosis Date Noted   Biceps tendonosis of left shoulder    Nontraumatic incomplete tear of right rotator cuff    Numbness of toes 11/27/2020   Stage 3b chronic kidney disease ([facility]) 11/04/2020   Routine general medical examination at a health care facility 10/31/2020   Visit for screening mammogram 10/28/2020   Primary hypertension 10/28/2020   Hyperlipidemia LDL goal <130 10/28/2020   HIP PAIN 12/10/2008   ILIOTIBIAL BAND SYNDROME 12/10/2008    PCP: Link Snuffer, MD  REFERRING PROVIDER: Huel Cote, MD  REFERRING DIAG: M75.111 (ICD-10-CM) - Nontraumatic incomplete tear of right rotator cuff  THERAPY DIAG:  Acute pain of left shoulder  Muscle weakness (generalized)  Abnormal  posture  Rationale for Evaluation and Treatment Rehabilitation  ONSET DATE: DOS 11/17/21  SUBJECTIVE:                                                                                                                                                                                      SUBJECTIVE STATEMENT: The patient reports she has been doing her exercises while she is on vacation She reports it is feeling really good. She hasn't had any pain in a few weeks.  PERTINENT HISTORY: none  PAIN:  Are you having pain? No 7/25    PRECAUTIONS: Shoulder  WEIGHT BEARING RESTRICTIONS Yes NWB  FALLS:  Has patient fallen in last 6 months? No  LIVING ENVIRONMENT: Lives with: lives with their spouse   OCCUPATION: Not working  PLOF: Independent  PATIENT GOALS aerobics, yoga, walking, I am active and I want to stay that way  OBJECTIVE:   PATIENT SURVEYS:  FOTO 4  UPPER EXTREMITY ROM:   Passive ROM Left eval Left 6/2  Left  7/25   Shoulder flexion 90 95 165   Shoulder extension     Shoulder abduction 45    Shoulder adduction     Shoulder internal rotation     Shoulder external rotation  15 without pushing to end range  60   Elbow flexion     Elbow extension     Wrist flexion     Wrist extension     Wrist ulnar deviation     Wrist radial deviation     Wrist pronation     Wrist supination     (Blank rows = not tested)  UPPER EXTREMITY MMT:  MMT Right  Left   Shoulder flexion    Shoulder extension    Shoulder abduction    Shoulder adduction    Shoulder internal rotation    Shoulder external rotation    Middle trapezius    Lower trapezius    Elbow flexion    Elbow extension    Wrist flexion    Wrist extension    Wrist ulnar deviation    Wrist radial deviation    Wrist pronation    Wrist supination    Grip strength (lbs)    (Blank rows = not tested)    TODAY'S TREATMENT:  7/25 Pulleys flexion 2 min, gentle scaption 1 min Wand flexion 1 lb 2x10   Side  lying er x10 and x10 with 1 lb  T-band IR yellow x20  Scap retraction red x20  Shoulder extension x20 red  Supine ABC     7/5 Pulleys flexion 2 min, gentle scaption 1 min  Seated: Wand ER AAROM, x10 (elbow propped on pillow) Seated: W's (scap squeeze with gentle ER) x 10  Standing in front of mirror: AROM with LUE flexion and scaption below 70 (watching for scap hiking) Rolling small ball up wall into shoulder flexion x 5 Shoulder rolls Lateral neck flexion R/L Holding small ball with 2 hands:  -(bilat shoulders flexed ~40) with pronation / supination of forearms       -Elbows neutral to elbow flexion to shoulder flexion to raise ball to eye height -Scap squeeze with ball to diagonal motion (elbow flexion, ball towards shoulder/ elbow ext, ball to hip Holding small ball with L hand - moving just past neutral into ext   6/29  Pulleys flexion 2 min  Manual: PROM flexion, abduction, and ER (arm by side) gentle to end limits within protocol  Wand ER AAROM, x10 1lb Wand Press AAROM, x10 1lb Wand flexion AAROM, x10 1lb  6/26 Pulleys flexion 2 min  Manual: PROM flexion, abduction, and ER (arm by side) gentle to end limits within protocol  Wand ER AAROM, x10 1lb Wand Press AAROM, x10 1lb Wand flexion AAROM, x10 1lb Shoulder retractions, x10  Extensive cuing for form and education not to push anything too far.      6/14   Pendulums   Manual: PROM flexion, abduction, and ER (arm by side) gentle to end limits within protocol Trigger point  release to the upper trap   Reviewed precautions      6/7   Seated :shoulder rolls CW   Scap retraction without movement of arm x 3-5 sec x 5 (arm supported on pillow)   Lt forearm pronation/ supination x 5;  verbally reviewed pendulum Manual:  PROM to Lt shoulder - into flex (to tissue limits and no pain), abdct (<60), ER (arm in supported scaption at side) <40; elbow flex / ext  STM to decrease fascial restriction and improve  mobility: to Lt periscapular musculature, Lt cervical/upper-mid thoracic paraspinals, gentle to distal biceps brachii and forearm      PATIENT EDUCATION: Education details: HEP progression, pulleys, AAROM exercises  Person educated: Patient Education method: Explanation, Demonstration, Tactile cues, Verbal cues, and Handouts Education comprehension: verbalized understanding, returned demonstration, verbal cues required, tactile cues required, and needs further education   HOME EXERCISE PROGRAM: [id number]  ASSESSMENT:  CLINICAL IMPRESSION: The patient has been on vacation for about 4 weeks. In that times she has worked hard on her exercises. She is doing very well. She is having very little pain. She has nearly full active ROM per visual inspection. Therapy was able to advance her exercises today. She can probably do more but these are the first active exercises 2nd to her break. We will likely cut her down to 1x a week after next visit se she is doing so well. We will continue to advance as tolerated Patient given an updated HEP.   OBJECTIVE IMPAIRMENTS decreased activity tolerance, decreased ROM, decreased strength, increased muscle spasms, impaired UE functional use, improper body mechanics, postural dysfunction, and pain.   ACTIVITY LIMITATIONS carrying, lifting, bed mobility, bathing, dressing, reach over head, hygiene/grooming, and caring for others  PARTICIPATION LIMITATIONS: meal prep, cleaning, laundry, driving, community activity, and fitness  PERSONAL FACTORS none  REHAB POTENTIAL: Good  CLINICAL DECISION MAKING: Stable/uncomplicated  EVALUATION COMPLEXITY: Low   GOALS: Goals reviewed with patient? Yes  SHORT TERM GOALS: Target date: 12/18/2021  PROM to meet stated protocol goals Baseline: see chart Goal status: INITIAL  2.  Resting pain <=3/10 Baseline:  Goal status: Achieved   LONG TERM GOALS: Target date: 02/12/2022    Shoulder MMT at least 50% of opp UE  per dynamometry testing Baseline:  Goal status: INITIAL  2.  Able to perform full overhead motion with 2lb without elevation of shoulder Baseline:  Goal status: INITIAL  3.  Resolution of scapular winging Baseline:  Goal status: INITIAL  4.  Able to perform self care activities without assistance Baseline:  Goal status: INITIAL  5.  Further goals to be set at this date   PLAN: PT FREQUENCY: 1-2x/week  PT DURATION: 12 weeks  PLANNED INTERVENTIONS: Therapeutic exercises, Therapeutic activity, Neuromuscular re-education, Patient/Family education, Joint mobilization, Aquatic Therapy, Dry Needling, Electrical stimulation, Cryotherapy, Moist heat, Taping, Vasopneumatic device, Manual therapy, and Re-evaluation  PLAN FOR NEXT SESSION: review tolerance to new exercises   Lorayne Bender PT DPT  01/18/22 4:10 PM

## 2022-01-19 ENCOUNTER — Ambulatory Visit
Admission: RE | Admit: 2022-01-19 | Discharge: 2022-01-19 | Disposition: A | Discharge status: Home / Self Care | Payer: Medicare PPO | Source: Ambulatory Visit | Attending: Internal Medicine | Admitting: Internal Medicine

## 2022-01-19 ENCOUNTER — Encounter (HOSPITAL_BASED_OUTPATIENT_CLINIC_OR_DEPARTMENT_OTHER): Payer: Self-pay | Admitting: Physical Therapy

## 2022-01-19 ENCOUNTER — Ambulatory Visit: Payer: Medicare PPO

## 2022-01-19 DIAGNOSIS — Z1231 Encounter for screening mammogram for malignant neoplasm of breast: Secondary | ICD-10-CM | POA: Diagnosis not present

## 2022-01-20 DIAGNOSIS — R200 Anesthesia of skin: Secondary | ICD-10-CM | POA: Diagnosis not present

## 2022-01-20 DIAGNOSIS — M545 Low back pain, unspecified: Secondary | ICD-10-CM | POA: Diagnosis not present

## 2022-01-20 DIAGNOSIS — M25512 Pain in left shoulder: Secondary | ICD-10-CM | POA: Diagnosis not present

## 2022-01-22 ENCOUNTER — Ambulatory Visit (HOSPITAL_BASED_OUTPATIENT_CLINIC_OR_DEPARTMENT_OTHER): Payer: Medicare PPO | Admitting: Physical Therapy

## 2022-01-22 ENCOUNTER — Encounter (HOSPITAL_BASED_OUTPATIENT_CLINIC_OR_DEPARTMENT_OTHER): Payer: Self-pay | Admitting: Physical Therapy

## 2022-01-22 DIAGNOSIS — R293 Abnormal posture: Secondary | ICD-10-CM

## 2022-01-22 DIAGNOSIS — M25512 Pain in left shoulder: Secondary | ICD-10-CM

## 2022-01-22 DIAGNOSIS — M6281 Muscle weakness (generalized): Secondary | ICD-10-CM | POA: Diagnosis not present

## 2022-01-22 NOTE — Therapy (Signed)
OUTPATIENT PHYSICAL THERAPY SHOULDER TREATMENT   Patient Name: Emily Reid MRN: [id number] DOB:05-10-1946, 76 y.o., female Today's Date: 01/22/2022        Past Medical History:  Diagnosis Date   Anxiety    Cataract    Mixed OU   Depression    Hyperlipidemia    Hypertension    Hypertensive retinopathy    OU   Left rotator cuff tear    Urine incontinence    Past Surgical History:  Procedure Laterality Date   FACIAL COSMETIC SURGERY     SHOULDER ARTHROSCOPY WITH SUBACROMIAL DECOMPRESSION, ROTATOR CUFF REPAIR AND BICEP TENDON REPAIR Left 11/17/2021   Procedure: LEFT SHOULDER ARTHROSCOPY WITH ROTATOR CUFF REPAIR AND BICEPS TENODESIS;  Surgeon: Huel Cote, MD;  Location: [facility];  Service: Orthopedics;  Laterality: Left;   TONSILLECTOMY  1957   tummy tuck     Patient Active Problem List   Diagnosis Date Noted   Biceps tendonosis of left shoulder    Nontraumatic incomplete tear of right rotator cuff    Numbness of toes 11/27/2020   Stage 3b chronic kidney disease (HCC) 11/04/2020   Routine general medical examination at a health care facility 10/31/2020   Visit for screening mammogram 10/28/2020   Primary hypertension 10/28/2020   Hyperlipidemia LDL goal <130 10/28/2020   HIP PAIN 12/10/2008   ILIOTIBIAL BAND SYNDROME 12/10/2008    PCP: Link Snuffer, MD  REFERRING PROVIDER: Huel Cote, MD  REFERRING DIAG: M75.111 (ICD-10-CM) - Nontraumatic incomplete tear of right rotator cuff  THERAPY DIAG:  No diagnosis found.  Rationale for Evaluation and Treatment Rehabilitation  ONSET DATE: DOS 11/17/21  SUBJECTIVE:                                                                                                                                                                                      SUBJECTIVE STATEMENT: The patient reports she has been doing her exercises while she is on vacation She reports it is feeling really good. She hasn't had  any pain in a few weeks.  PERTINENT HISTORY: none  PAIN:  Are you having pain? No 7/25    PRECAUTIONS: Shoulder  WEIGHT BEARING RESTRICTIONS Yes NWB  FALLS:  Has patient fallen in last 6 months? No  LIVING ENVIRONMENT: Lives with: lives with their spouse   OCCUPATION: Not working  PLOF: Independent  PATIENT GOALS aerobics, yoga, walking, I am active and I want to stay that way  OBJECTIVE:   PATIENT SURVEYS:  FOTO 4  UPPER EXTREMITY ROM:   Passive ROM Left eval Left 6/2  Left  7/25   Shoulder flexion 90 95 165   Shoulder  extension     Shoulder abduction 45    Shoulder adduction     Shoulder internal rotation     Shoulder external rotation  15 without pushing to end range  60   Elbow flexion     Elbow extension     Wrist flexion     Wrist extension     Wrist ulnar deviation     Wrist radial deviation     Wrist pronation     Wrist supination     (Blank rows = not tested)  UPPER EXTREMITY MMT:  MMT Right  Left   Shoulder flexion    Shoulder extension    Shoulder abduction    Shoulder adduction    Shoulder internal rotation    Shoulder external rotation    Middle trapezius    Lower trapezius    Elbow flexion    Elbow extension    Wrist flexion    Wrist extension    Wrist ulnar deviation    Wrist radial deviation    Wrist pronation    Wrist supination    Grip strength (lbs)    (Blank rows = not tested)    TODAY'S TREATMENT:  7/28  Pulleys flexion 2 min, gentle scaption 1 min Wand flexion 1 lb 3x10   Side lying er 3x10 1lb  T-band IR yellow x20  Scap retraction red x20  Shoulder extension x20 red  Supine ABC 1 lb    7/25 Pulleys flexion 2 min, gentle scaption 1 min Wand flexion 1 lb 2x10   Side lying er x10 and x10 with 1 lb  T-band IR yellow x20  Scap retraction red x20  Shoulder extension x20 red  Supine ABC     7/5 Pulleys flexion 2 min, gentle scaption 1 min  Seated: Wand ER AAROM, x10 (elbow propped on  pillow) Seated: W's (scap squeeze with gentle ER) x 10  Standing in front of mirror: AROM with LUE flexion and scaption below 70 (watching for scap hiking) Rolling small ball up wall into shoulder flexion x 5 Shoulder rolls Lateral neck flexion R/L Holding small ball with 2 hands:  -(bilat shoulders flexed ~40) with pronation / supination of forearms       -Elbows neutral to elbow flexion to shoulder flexion to raise ball to eye height -Scap squeeze with ball to diagonal motion (elbow flexion, ball towards shoulder/ elbow ext, ball to hip Holding small ball with L hand - moving just past neutral into ext  ]    PATIENT EDUCATION: Education details: HEP progression, pulleys, AAROM exercises  Person educated: Patient Education method: Explanation, Demonstration, Tactile cues, Verbal cues, and Handouts Education comprehension: verbalized understanding, returned demonstration, verbal cues required, tactile cues required, and needs further education   HOME EXERCISE PROGRAM: [id number]  ASSESSMENT:  CLINICAL IMPRESSION: The patient continues to tolerate treatment very well. She is having no pain with activity. We advanced her wand exercises to 2lb today. She is performing green band exercises for scapular strengthening. At this time we will reduce her frequency to 1x a week. Therapy will continue to advance as tolerated.  OBJECTIVE IMPAIRMENTS decreased activity tolerance, decreased ROM, decreased strength, increased muscle spasms, impaired UE functional use, improper body mechanics, postural dysfunction, and pain.   ACTIVITY LIMITATIONS carrying, lifting, bed mobility, bathing, dressing, reach over head, hygiene/grooming, and caring for others  PARTICIPATION LIMITATIONS: meal prep, cleaning, laundry, driving, community activity, and fitness  PERSONAL FACTORS none  REHAB POTENTIAL: Good  CLINICAL DECISION MAKING: Stable/uncomplicated  EVALUATION COMPLEXITY: Low   GOALS: Goals  reviewed with patient? Yes  SHORT TERM GOALS: Target date: 12/18/2021  PROM to meet stated protocol goals Baseline: see chart Goal status: INITIAL  2.  Resting pain <=3/10 Baseline:  Goal status: Achieved   LONG TERM GOALS: Target date: 02/12/2022    Shoulder MMT at least 50% of opp UE per dynamometry testing Baseline:  Goal status: INITIAL  2.  Able to perform full overhead motion with 2lb without elevation of shoulder Baseline:  Goal status: INITIAL  3.  Resolution of scapular winging Baseline:  Goal status: INITIAL  4.  Able to perform self care activities without assistance Baseline:  Goal status: INITIAL  5.  Further goals to be set at this date   PLAN: PT FREQUENCY: 1-2x/week  PT DURATION: 12 weeks  PLANNED INTERVENTIONS: Therapeutic exercises, Therapeutic activity, Neuromuscular re-education, Patient/Family education, Joint mobilization, Aquatic Therapy, Dry Needling, Electrical stimulation, Cryotherapy, Moist heat, Taping, Vasopneumatic device, Manual therapy, and Re-evaluation  PLAN FOR NEXT SESSION: review tolerance to new exercises   Lorayne Bender PT DPT  01/22/22 4:14 PM

## 2022-01-25 ENCOUNTER — Encounter (HOSPITAL_BASED_OUTPATIENT_CLINIC_OR_DEPARTMENT_OTHER): Payer: Medicare PPO | Admitting: Physical Therapy

## 2022-01-27 DIAGNOSIS — M25552 Pain in left hip: Secondary | ICD-10-CM | POA: Diagnosis not present

## 2022-01-29 ENCOUNTER — Encounter (HOSPITAL_BASED_OUTPATIENT_CLINIC_OR_DEPARTMENT_OTHER): Payer: Self-pay | Admitting: Physical Therapy

## 2022-01-29 ENCOUNTER — Ambulatory Visit (HOSPITAL_BASED_OUTPATIENT_CLINIC_OR_DEPARTMENT_OTHER): Payer: Medicare PPO | Attending: Orthopaedic Surgery | Admitting: Physical Therapy

## 2022-01-29 DIAGNOSIS — R293 Abnormal posture: Secondary | ICD-10-CM | POA: Insufficient documentation

## 2022-01-29 DIAGNOSIS — M25512 Pain in left shoulder: Secondary | ICD-10-CM | POA: Insufficient documentation

## 2022-01-29 DIAGNOSIS — M6281 Muscle weakness (generalized): Secondary | ICD-10-CM | POA: Diagnosis not present

## 2022-01-29 NOTE — Therapy (Signed)
OUTPATIENT PHYSICAL THERAPY SHOULDER TREATMENT   Patient Name: Emily Reid MRN: [id number] DOB:[birth year], 76 y.o., female Today's Date: 02/01/2022   PT End of Session - 02/01/22 0807     Visit Number 10    Number of Visits 25    Date for PT Re-Evaluation 03/12/22    Authorization Type [employer] M[employer]    PT Start Time 1555    PT Stop Time 1633    PT Time Calculation (min) 38 min    Activity Tolerance Patient tolerated treatment well    Behavior During Therapy WFL for tasks assessed/performed                  Past Medical History:  Diagnosis Date   Anxiety    Cataract    Mixed OU   Depression    Hyperlipidemia    Hypertension    Hypertensive retinopathy    OU   Left rotator cuff tear    Urine incontinence    Past Surgical History:  Procedure Laterality Date   FACIAL COSMETIC SURGERY     SHOULDER ARTHROSCOPY WITH SUBACROMIAL DECOMPRESSION, ROTATOR CUFF REPAIR AND BICEP TENDON REPAIR Left 11/17/2021   Procedure: LEFT SHOULDER ARTHROSCOPY WITH ROTATOR CUFF REPAIR AND BICEPS TENODESIS;  Surgeon: Huel Cote, MD;  [facility];  Service: Orthopedics;  Laterality: Left;   TONSILLECTOMY  1957   tummy tuck     Patient Active Problem List   Diagnosis Date Noted   Biceps tendonosis of left shoulder    Nontraumatic incomplete tear of right rotator cuff    Numbness of toes 11/27/2020   Stage 3b chronic kidney disease (HCC) 11/04/2020   Routine general medical examination at a health care facility 10/31/2020   Visit for screening mammogram 10/28/2020   Primary hypertension 10/28/2020   Hyperlipidemia LDL goal <130 10/28/2020   HIP PAIN 12/10/2008   ILIOTIBIAL BAND SYNDROME 12/10/2008    PCP: Link Snuffer, MD  REFERRING PROVIDER: Huel Cote, MD  REFERRING DIAG: M75.111 (ICD-10-CM) - Nontraumatic incomplete tear of right rotator cuff  THERAPY DIAG:  Acute pain of left shoulder  Muscle weakness (generalized)  Abnormal  posture  Rationale for Evaluation and Treatment Rehabilitation  ONSET DATE: DOS 11/17/21  SUBJECTIVE:                                                                                                                                                                                      SUBJECTIVE STATEMENT: Patient continues to report no pain or difficulty with activity    PERTINENT HISTORY: none  PAIN:  Are you having pain? No 8/7   PRECAUTIONS: Shoulder  WEIGHT BEARING RESTRICTIONS Yes  NWB  FALLS:  Has patient fallen in last 6 months? No  LIVING ENVIRONMENT: Lives with: lives with their spouse   OCCUPATION: Not working  PLOF: Independent  PATIENT GOALS aerobics, yoga, walking, I am active and I want to stay that way  OBJECTIVE:   PATIENT SURVEYS:  FOTO 4  UPPER EXTREMITY ROM:   Passive ROM Left eval Left 6/2  Left  7/25   Shoulder flexion 90 95 165   Shoulder extension     Shoulder abduction 45    Shoulder adduction     Shoulder internal rotation     Shoulder external rotation  15 without pushing to end range  60   Elbow flexion     Elbow extension     Wrist flexion     Wrist extension     Wrist ulnar deviation     Wrist radial deviation     Wrist pronation     Wrist supination     (Blank rows = not tested)  Active flexion 170  Active er behind the head without pain  Active IR to T-8    UPPER EXTREMITY MMT:  MMT Right  Left   Shoulder flexion    Shoulder extension    Shoulder abduction    Shoulder adduction    Shoulder internal rotation    Shoulder external rotation    Middle trapezius    Lower trapezius    Elbow flexion    Elbow extension    Wrist flexion    Wrist extension    Wrist ulnar deviation    Wrist radial deviation    Wrist pronation    Wrist supination    Grip strength (lbs)    (Blank rows = not tested)   Left  Shoulder flexion 4+//5  Shoulder Er 4+/5  IR 5/5  TODAY'S TREATMENT:  8/4 Pulleys flexion 2 min,  gentle scaption 1 min Supine flexion 1 lb 3x10   Side lying er x20 2b  T-band IR Green  x20  Scap retraction green x20  Shoulder extension x20 green  Supine ABC 1 lb  T-band ER x20 green   Standing flexion in the mirror and scaption x10 without  1lb x10      7/28  Pulleys flexion 2 min, gentle scaption 1 min Wand flexion 1 lb 3x10   Side lying er 3x10 1lb  T-band IR yellow x20  Scap retraction red x20  Shoulder extension x20 red  Supine ABC 1 lb    7/25 Pulleys flexion 2 min, gentle scaption 1 min Wand flexion 1 lb 2x10   Side lying er x10 and x10 with 1 lb  T-band IR yellow x20  Scap retraction red x20  Shoulder extension x20 red  Supine ABC     7/5 Pulleys flexion 2 min, gentle scaption 1 min  Seated: Wand ER AAROM, x10 (elbow propped on pillow) Seated: W's (scap squeeze with gentle ER) x 10  Standing in front of mirror: AROM with LUE flexion and scaption below 70 (watching for scap hiking) Rolling small ball up wall into shoulder flexion x 5 Shoulder rolls Lateral neck flexion R/L Holding small ball with 2 hands:  -(bilat shoulders flexed ~40) with pronation / supination of forearms       -Elbows neutral to elbow flexion to shoulder flexion to raise ball to eye height -Scap squeeze with ball to diagonal motion (elbow flexion, ball towards shoulder/ elbow ext, ball to hip Holding small ball with L hand - moving just  past neutral into ext  ]    PATIENT EDUCATION: Education details: HEP progression, pulleys, AAROM exercises  Person educated: Patient Education method: Explanation, Demonstration, Tactile cues, Verbal cues, and Handouts Education comprehension: verbalized understanding, returned demonstration, verbal cues required, tactile cues required, and needs further education   HOME EXERCISE PROGRAM: [id number]  ASSESSMENT:  CLINICAL IMPRESSION: The patient is making excellent progress. She has full active ROM without pain> she is lacking a minor  amount of strength, but her strength is progressing. We initiated standing flexion with light weight today. If she tolerates well we will likely D/C within the next 2-3 visits. We will continue 1W6 a this time.  OBJECTIVE IMPAIRMENTS decreased activity tolerance, decreased ROM, decreased strength, increased muscle spasms, impaired UE functional use, improper body mechanics, postural dysfunction, and pain.   ACTIVITY LIMITATIONS carrying, lifting, bed mobility, bathing, dressing, reach over head, hygiene/grooming, and caring for others  PARTICIPATION LIMITATIONS: meal prep, cleaning, laundry, driving, community activity, and fitness  PERSONAL FACTORS none  REHAB POTENTIAL: Good  CLINICAL DECISION MAKING: Stable/uncomplicated  EVALUATION COMPLEXITY: Low   GOALS: Goals reviewed with patient? Yes  SHORT TERM GOALS: Target date: 12/18/2021  PROM to meet stated protocol goals Baseline: see chart Goal status: INITIAL  2.  Resting pain <=3/10 Baseline:  Goal status: Achieved   LONG TERM GOALS: Target date: 02/12/2022    Shoulder MMT at least 50% of opp UE per dynamometry testing Baseline:  Goal status: INITIAL  2.  Able to perform full overhead motion with 2lb without elevation of shoulder Baseline:  Goal status: INITIAL  3.  Resolution of scapular winging Baseline:  Goal status: INITIAL  4.  Able to perform self care activities without assistance Baseline:  Goal status: INITIAL  5.  Further goals to be set at this date   PLAN: PT FREQUENCY: 1-2x/week  PT DURATION: 12 weeks  PLANNED INTERVENTIONS: Therapeutic exercises, Therapeutic activity, Neuromuscular re-education, Patient/Family education, Joint mobilization, Aquatic Therapy, Dry Needling, Electrical stimulation, Cryotherapy, Moist heat, Taping, Vasopneumatic device, Manual therapy, and Re-evaluation  PLAN FOR NEXT SESSION: review tolerance to new exercises   Lorayne Bender PT DPT  02/01/22 8:08 AM

## 2022-02-01 ENCOUNTER — Encounter (HOSPITAL_BASED_OUTPATIENT_CLINIC_OR_DEPARTMENT_OTHER): Payer: Self-pay | Admitting: Physical Therapy

## 2022-02-01 ENCOUNTER — Encounter (HOSPITAL_BASED_OUTPATIENT_CLINIC_OR_DEPARTMENT_OTHER): Payer: Medicare PPO | Admitting: Physical Therapy

## 2022-02-02 NOTE — Progress Notes (Signed)
[facility] Note  02/10/2022     CHIEF COMPLAINT Patient presents for Retina Follow Up   HISTORY OF PRESENT ILLNESS: INFANT Emily Reid is a 76 y.o. female who presents to the clinic today for:  HPI     Retina Follow Up   Patient presents with  CRVO/BRVO.  In right eye.  This started 11 weeks ago.  I, the attending physician,  performed the HPI with the patient and updated documentation appropriately.        Comments   Patient here for 11 weeks retina follow up for BRVO OD. Patient states vision doing good. No eye pain.       Last edited by Bernarda Caffey, MD on 02/10/2022  1:26 PM.      Referring physician: Melbourne Abts MD [address], [place] [address]  HISTORICAL INFORMATION:   Selected notes from the MEDICAL RECORD NUMBER Referred by Dr. Joni Fears for continuation of eye injections OD for BRVO with edema LEE: 08/05/2020 Ocular Hx- BRVO, CME, iritis OD PMH- HTN   CURRENT MEDICATIONS: No current outpatient medications on file. (Ophthalmic Drugs)   No current facility-administered medications for this visit. (Ophthalmic Drugs)   Current Outpatient Medications (Other)  Medication Sig   amLODipine (NORVASC) 10 MG tablet Take 1 tablet (10 mg total) by mouth daily.   buPROPion (WELLBUTRIN XL) 150 MG 24 hr tablet Take 150 mg by mouth daily.   Coenzyme Q10 (COQ10) 100 MG CAPS Take by mouth daily.   LOSARTAN POTASSIUM PO Take 1 tablet by mouth daily.   mirtazapine (REMERON) 15 MG tablet TAKE 1 TABLET AT BEDTIME   Multiple Vitamin (MULTIVITAMIN) tablet Take 1 tablet by mouth daily.   Phosphatidylserine 100 MG CAPS Take by mouth.   rosuvastatin (CRESTOR) 20 MG tablet Take 20 mg by mouth daily.   sertraline (ZOLOFT) 50 MG tablet TAKE 1 TABLET EVERY DAY   Turmeric (QC TUMERIC COMPLEX PO) Take 553 mg by mouth daily.   vitamin C (ASCORBIC ACID) 500 MG tablet Take 500 mg by mouth daily.   aspirin EC 325 MG tablet Take 1 tablet (325  mg total) by mouth daily. (Patient not taking: Reported on 11/13/2021)   oxyCODONE (OXY IR/ROXICODONE) 5 MG immediate release tablet Take 1 tablet (5 mg total) by mouth every 4 (four) hours as needed (severe pain). (Patient not taking: Reported on 11/13/2021)   No current facility-administered medications for this visit. (Other)   REVIEW OF SYSTEMS: ROS   Positive for: Genitourinary, Eyes Negative for: Constitutional, Gastrointestinal, Neurological, Skin, Musculoskeletal, HENT, Endocrine, Cardiovascular, Respiratory, Psychiatric, Allergic/Imm, Heme/Lymph Last edited by Theodore Demark, COA on 02/10/2022  1:16 PM.     ALLERGIES No Known Allergies  PAST MEDICAL HISTORY Past Medical History:  Diagnosis Date   Anxiety    Cataract    Mixed OU   Depression    Hyperlipidemia    Hypertension    Hypertensive retinopathy    OU   Left rotator cuff tear    Urine incontinence    Past Surgical History:  Procedure Laterality Date   FACIAL COSMETIC SURGERY     SHOULDER ARTHROSCOPY WITH SUBACROMIAL DECOMPRESSION, ROTATOR CUFF REPAIR AND BICEP TENDON REPAIR Left 11/17/2021   Procedure: LEFT SHOULDER ARTHROSCOPY WITH ROTATOR CUFF REPAIR AND BICEPS TENODESIS;  Surgeon: Vanetta Mulders, MD;  Location: [place];  Service: Orthopedics;  Laterality: Left;   TONSILLECTOMY  1957   tummy tuck  FAMILY HISTORY Family History  Problem Relation Age of Onset   Heart disease Mother    Hyperlipidemia Mother    Hypertension Mother    Stroke Mother    SOCIAL HISTORY Social History   Tobacco Use   Smoking status: Never   Smokeless tobacco: Never  Vaping Use   Vaping Use: Never used  Substance Use Topics   Alcohol use: Yes    Alcohol/week: 7.0 standard drinks of alcohol    Types: 7 Glasses of wine per week    Comment: social   Drug use: No       OPHTHALMIC EXAM:  Base Eye Exam     Visual Acuity (Snellen - Linear)       Right Left   Dist cc 20/20 -2 20/20 -1          Tonometry (Tonopen, 1:14 PM)       Right Left   Pressure 17 15         Pupils       Dark Light Shape React APD   Right 3 2 Round Brisk None   Left 3 2 Round Brisk None         Visual Fields (Counting fingers)       Left Right    Full Full         Extraocular Movement       Right Left    Full, Ortho Full, Ortho         Neuro/Psych     Oriented x3: Yes   Mood/Affect: Normal         Dilation     Both eyes: 1.0% Mydriacyl, 2.5% Phenylephrine @ 1:14 PM           Slit Lamp and Fundus Exam     Slit Lamp Exam       Right Left   Lids/Lashes Dermatochalasis - upper lid Dermatochalasis - upper lid   Conjunctiva/Sclera White and quiet White and quiet   Cornea 1+ Punctate epithelial erosions, early band K nasal and temporal 1+ Punctate epithelial erosions, early band K nasal and temporal   Anterior Chamber Deep and quiet Deep and quiet   Iris Round and dilated Round and dilated   Lens 2+ Nuclear sclerosis, 1-2+ Cortical cataract, focal Posterior subcapsular cataract at 0700 2+ Nuclear sclerosis, 2+ Cortical cataract, focal Posterior subcapsular cataract at 0700   Anterior Vitreous Vitreous syneresis, silicone oil micro bubbles Vitreous syneresis, silicone oil micro bubbles, Posterior vitreous detachment         Fundus Exam       Right Left   Disc Pink and Sharp, mild PPA Pink and Sharp   C/D Ratio 0.3 0.2   Macula Flat, Blunted foveal reflex, Drusen, RPE mottling, No heme, stable improvement in central cystic changes / edema Flat, Blunted foveal reflex, Drusen, RPE mottling and clumping, No heme or edema   Vessels mild attenuation, mild tortuosity attenuated, mild tortuosity   Periphery Attached, no heme Attached, mild mid zonal drusen, no heme              Refraction     Wearing Rx       Sphere Cylinder Axis Add   Right -0.50 +0.25 031 +2.50   Left +1.00 Sphere  +2.50           IMAGING AND PROCEDURES  Imaging and Procedures for  02/10/2022  OCT, Retina - OU - Both Eyes       Right Eye Quality was  good. Central Foveal Thickness: 291. Progression has been stable. Findings include normal foveal contour, no SRF, abnormal foveal contour, intraretinal fluid (Hx of BRVO, persistent tr cystic changes inf fovea and mac).   Left Eye Quality was good. Central Foveal Thickness: 297. Progression has been stable. Findings include normal foveal contour, no IRF, no SRF, retinal drusen .   Notes *Images captured and stored on drive  Diagnosis / Impression:  OD: Hx of BRVO, persistent tr cystic changes inf fovea and mac OS: NFP, no IRF/SRF, +drusen  Clinical management:  See below  Abbreviations: NFP - Normal foveal profile. CME - cystoid macular edema. PED - pigment epithelial detachment. IRF - intraretinal fluid. SRF - subretinal fluid. EZ - ellipsoid zone. ERM - epiretinal membrane. ORA - outer retinal atrophy. ORT - outer retinal tubulation. SRHM - subretinal hyper-reflective material. IRHM - intraretinal hyper-reflective material      Intravitreal Injection, Pharmacologic Agent - OD - Right Eye       Time Out 02/10/2022. 1:32 PM. Confirmed correct patient, procedure, site, and patient consented.   Anesthesia Topical anesthesia was used. Anesthetic medications included Lidocaine 2%, Proparacaine 0.5%.   Procedure Preparation included 5% betadine to ocular surface, eyelid speculum. A (32g) needle was used.   Injection: 1.25 mg Bevacizumab 1.82m/0.05ml   Route: Intravitreal, Site: Right Eye   NDC: 50242-060-01, Lot:: [id number] Expiration date: 04/16/2022   Post-op Post injection exam found visual acuity of at least counting fingers. The patient tolerated the procedure well. There were no complications. The patient received written and verbal post procedure care education. Post injection medications were not given.             ASSESSMENT/PLAN:    ICD-10-CM   1. Branch retinal vein occlusion of right eye  with macular edema  H34.8310 OCT, Retina - OU - Both Eyes    Intravitreal Injection, Pharmacologic Agent - OD - Right Eye    Bevacizumab (AVASTIN) SOLN 1.25 mg    2. Essential hypertension  I10     3. Hypertensive retinopathy of both eyes  H35.033     4. Combined forms of age-related cataract of both eyes  H25.813       1. BRVO w/ CME OD             - s/p IVA OD #1 (03.30.22), #2 (05.23.22), #3 (7.25.22), #4 (10.05.22), #5 (12.14.22), #6 (03.15.23), #7 (05.31.23)  - hx of injections with [place]. [place]oni [place] [place]-- pt was receiving Lucentis q7-8 wks; pt reports history Avastin, but was switched to Lucentis at some point - last injection IVL OD was 02.04.22 (7.5 wks prior to initial visit here) - pt reports hx of worsening vision and OCT around 10 weeks - **worsening of IRF on OCT at 13 weeks on 03.15.23** - BCVA OD is 20/20 (stable) - OCT today shows stable improvement in cystic changes IT fovea at 11 weeks -- just tr cystic changes remain - recommend IVA OD #8 today, 08.16.23 -- maintenance therapy w/ f/u in 11 wks - RBA of procedure discussed, questions answered - IVA informed consent obtained and signed, 08.16.23 (OD) - see procedure note - F/U 11 weeks -- DFE/OCT/possible injection  2,3. Hypertensive retinopathy OU - discussed importance of tight BP control - monitor  4. Mixed Cataract OU - The symptoms of cataract, surgical options, and treatments and risks were discussed with patient. - discussed diagnosis and progression - not yet visually significant - monitor for now  Ophthalmic Meds Ordered this  visit:  Meds ordered this encounter  Medications   Bevacizumab (AVASTIN) SOLN 1.25 mg     Return in about 11 weeks (around 04/28/2022) for BRVO OD, Dilated Exam, OCT, Possible Injxn.  There are no Patient Instructions on file for this visit.  This document serves as a record of services personally performed by Gardiner Sleeper, MD, PhD. It was created on their behalf  by Orvan Falconer, an ophthalmic technician. The creation of this record is the provider's dictation and/or activities during the visit.    Electronically signed by: Orvan Falconer, OA, 02/10/22  2:00 PM  Gardiner Sleeper, M.D., Ph.D. Diseases & Surgery of the Retina and Vitreous [facility]  I have reviewed the above documentation for accuracy and completeness, and I agree with the above. Gardiner Sleeper, M.D., Ph.D. 02/10/22 2:00 PM   Abbreviations: M myopia (nearsighted); A astigmatism; H hyperopia (farsighted); P presbyopia; Mrx spectacle prescription;  CTL contact lenses; OD right eye; OS left eye; OU both eyes  XT exotropia; ET esotropia; PEK punctate epithelial keratitis; PEE punctate epithelial erosions; DES dry eye syndrome; MGD meibomian gland dysfunction; ATs artificial tears; PFAT's preservative free artificial tears; [place] nuclear sclerotic cataract; PSC posterior subcapsular cataract; ERM epi-retinal membrane; PVD posterior vitreous detachment; RD retinal detachment; DM diabetes mellitus; DR diabetic retinopathy; NPDR non-proliferative diabetic retinopathy; PDR proliferative diabetic retinopathy; CSME clinically significant macular edema; DME diabetic macular edema; dbh dot blot hemorrhages; CWS cotton wool spot; POAG primary open angle glaucoma; C/D cup-to-disc ratio; HVF humphrey visual field; GVF goldmann visual field; OCT optical coherence tomography; IOP intraocular pressure; BRVO Branch retinal vein occlusion; CRVO central retinal vein occlusion; CRAO central retinal artery occlusion; BRAO branch retinal artery occlusion; RT retinal tear; SB scleral buckle; PPV pars plana vitrectomy; VH Vitreous hemorrhage; PRP panretinal laser photocoagulation; IVK intravitreal kenalog; VMT vitreomacular traction; MH Macular hole;  NVD neovascularization of the disc; NVE neovascularization elsewhere; AREDS age related eye disease study; ARMD age related macular degeneration;  POAG primary open angle glaucoma; EBMD epithelial/anterior basement membrane dystrophy; ACIOL anterior chamber intraocular lens; IOL intraocular lens; PCIOL posterior chamber intraocular lens; Phaco/IOL phacoemulsification with intraocular lens placement; [place] photorefractive keratectomy; LASIK laser assisted in situ keratomileusis; HTN hypertension; DM diabetes mellitus; COPD chronic obstructive pulmonary disease

## 2022-02-05 ENCOUNTER — Encounter (HOSPITAL_BASED_OUTPATIENT_CLINIC_OR_DEPARTMENT_OTHER): Payer: Medicare PPO | Admitting: Physical Therapy

## 2022-02-08 ENCOUNTER — Ambulatory Visit (INDEPENDENT_AMBULATORY_CARE_PROVIDER_SITE_OTHER): Payer: Medicare PPO | Admitting: Orthopaedic Surgery

## 2022-02-08 ENCOUNTER — Encounter (HOSPITAL_BASED_OUTPATIENT_CLINIC_OR_DEPARTMENT_OTHER): Payer: Medicare PPO | Admitting: Orthopaedic Surgery

## 2022-02-08 ENCOUNTER — Encounter (HOSPITAL_BASED_OUTPATIENT_CLINIC_OR_DEPARTMENT_OTHER): Payer: Medicare PPO | Admitting: Physical Therapy

## 2022-02-08 DIAGNOSIS — M75111 Incomplete rotator cuff tear or rupture of right shoulder, not specified as traumatic: Secondary | ICD-10-CM

## 2022-02-08 NOTE — Progress Notes (Signed)
Post Operative Evaluation    Procedure/Date of Surgery: Left shoulder rotator cuff repair and biceps tenodesis 11/17/21  Interval History:    Presents today 2 months status post the above procedure.  Overall she is doing extremely well.  She is now back to essentially full range of motion.  She has no pain at this time.  She has been working on Print production planner.  She is overall doing very well.  PMH/PSH/Family History/Social History/Meds/Allergies:    Past Medical History:  Diagnosis Date   Anxiety    Cataract    Mixed OU   Depression    Hyperlipidemia    Hypertension    Hypertensive retinopathy    OU   Left rotator cuff tear    Urine incontinence    Past Surgical History:  Procedure Laterality Date   FACIAL COSMETIC SURGERY     SHOULDER ARTHROSCOPY WITH SUBACROMIAL DECOMPRESSION, ROTATOR CUFF REPAIR AND BICEP TENDON REPAIR Left 11/17/2021   Procedure: LEFT SHOULDER ARTHROSCOPY WITH ROTATOR CUFF REPAIR AND BICEPS TENODESIS;  Surgeon: Huel Cote, MD;  Location: [facility];  Service: Orthopedics;  Laterality: Left;   TONSILLECTOMY  1957   tummy tuck     Social History   Socioeconomic History   Marital status: Married    Spouse name: Not on file   Number of children: Not on file   Years of education: Not on file   Highest education level: Not on file  Occupational History   Not on file  Tobacco Use   Smoking status: Never   Smokeless tobacco: Never  Vaping Use   Vaping Use: Never used  Substance and Sexual Activity   Alcohol use: Yes    Alcohol/week: 7.0 standard drinks of alcohol    Types: 7 Glasses of wine per week    Comment: social   Drug use: No   Sexual activity: Yes    Partners: Male    Birth control/protection: Post-menopausal  Other Topics Concern   Not on file  Social History Narrative   Not on file   Social Determinants of Health   Financial Resource Strain: Not on file  Food Insecurity: Not  on file  Transportation Needs: Not on file  Physical Activity: Not on file  Stress: Not on file  Social Connections: Not on file   Family History  Problem Relation Age of Onset   Heart disease Mother    Hyperlipidemia Mother    Hypertension Mother    Stroke Mother    No Known Allergies Current Outpatient Medications  Medication Sig Dispense Refill   amLODipine (NORVASC) 10 MG tablet Take 1 tablet (10 mg total) by mouth daily. 90 tablet 1   aspirin EC 325 MG tablet Take 1 tablet (325 mg total) by mouth daily. (Patient not taking: Reported on 11/13/2021) 30 tablet 0   buPROPion (WELLBUTRIN XL) 150 MG 24 hr tablet Take 150 mg by mouth daily.     Coenzyme Q10 (COQ10) 100 MG CAPS Take by mouth daily.     LOSARTAN POTASSIUM PO Take 1 tablet by mouth daily.     mirtazapine (REMERON) 15 MG tablet TAKE 1 TABLET AT BEDTIME 90 tablet 1   Multiple Vitamin (MULTIVITAMIN) tablet Take 1 tablet by mouth daily.     oxyCODONE (OXY IR/ROXICODONE) 5 MG immediate release tablet Take 1  tablet (5 mg total) by mouth every 4 (four) hours as needed (severe pain). (Patient not taking: Reported on 11/13/2021) 20 tablet 0   Phosphatidylserine 100 MG CAPS Take by mouth.     rosuvastatin (CRESTOR) 20 MG tablet Take 20 mg by mouth daily.     sertraline (ZOLOFT) 50 MG tablet TAKE 1 TABLET EVERY DAY 90 tablet 1   Turmeric (QC TUMERIC COMPLEX PO) Take 553 mg by mouth daily.     vitamin C (ASCORBIC ACID) 500 MG tablet Take 500 mg by mouth daily.     No current facility-administered medications for this visit.   No results found.  Review of Systems:   A ROS was performed including pertinent positives and negatives as documented in the HPI.   Musculoskeletal Exam:    There were no vitals taken for this visit.  Left incisions about the shoulder are well-healed.  Left shoulder active range of motion is to approximately 171 degrees compared to 170 on the right.  External rotation at the side is to 60 degrees  bilaterally.  Internal rotation is to T4 bilaterally  Imaging:    None  I personally reviewed and interpreted the radiographs.   Assessment:   76 year old female who is 12 weeks status post left shoulder rotator cuff repair with biceps tenodesis doing very well.  At this time she will continue the strengthening portion of the protocol.  I will plan to see her back in 3 months for recheck and likely final visit  Plan :    -She will return to clinic in 3 months      I personally saw and evaluated the patient, and participated in the management and treatment plan.  Huel Cote, MD Attending Physician, Orthopedic Surgery  This document was dictated using Dragon voice recognition software. A reasonable attempt at proof reading has been made to minimize errors.

## 2022-02-10 ENCOUNTER — Encounter (INDEPENDENT_AMBULATORY_CARE_PROVIDER_SITE_OTHER): Payer: Self-pay | Admitting: Ophthalmology

## 2022-02-10 ENCOUNTER — Ambulatory Visit (INDEPENDENT_AMBULATORY_CARE_PROVIDER_SITE_OTHER): Payer: Medicare PPO | Admitting: Ophthalmology

## 2022-02-10 DIAGNOSIS — H34831 Tributary (branch) retinal vein occlusion, right eye, with macular edema: Secondary | ICD-10-CM | POA: Diagnosis not present

## 2022-02-10 DIAGNOSIS — I10 Essential (primary) hypertension: Secondary | ICD-10-CM | POA: Diagnosis not present

## 2022-02-10 DIAGNOSIS — H25813 Combined forms of age-related cataract, bilateral: Secondary | ICD-10-CM

## 2022-02-10 DIAGNOSIS — H35033 Hypertensive retinopathy, bilateral: Secondary | ICD-10-CM | POA: Diagnosis not present

## 2022-02-10 MED ORDER — BEVACIZUMAB CHEMO INJECTION 1.25MG/0.05ML SYRINGE FOR KALEIDOSCOPE
1.2500 mg | INTRAVITREAL | Status: AC | PRN
  Administered 2022-02-10: 1.25 mg via INTRAVITREAL

## 2022-02-11 DIAGNOSIS — I129 Hypertensive chronic kidney disease with stage 1 through stage 4 chronic kidney disease, or unspecified chronic kidney disease: Secondary | ICD-10-CM | POA: Diagnosis not present

## 2022-02-11 DIAGNOSIS — R944 Abnormal results of kidney function studies: Secondary | ICD-10-CM | POA: Diagnosis not present

## 2022-02-11 DIAGNOSIS — E785 Hyperlipidemia, unspecified: Secondary | ICD-10-CM | POA: Diagnosis not present

## 2022-02-11 DIAGNOSIS — N281 Cyst of kidney, acquired: Secondary | ICD-10-CM | POA: Diagnosis not present

## 2022-02-12 ENCOUNTER — Ambulatory Visit (HOSPITAL_BASED_OUTPATIENT_CLINIC_OR_DEPARTMENT_OTHER): Payer: Medicare PPO | Admitting: Physical Therapy

## 2022-02-12 DIAGNOSIS — M25512 Pain in left shoulder: Secondary | ICD-10-CM

## 2022-02-12 DIAGNOSIS — M6281 Muscle weakness (generalized): Secondary | ICD-10-CM | POA: Diagnosis not present

## 2022-02-12 DIAGNOSIS — R293 Abnormal posture: Secondary | ICD-10-CM

## 2022-02-12 NOTE — Therapy (Addendum)
OUTPATIENT PHYSICAL THERAPY SHOULDER TREATMENT/Discharge    Patient Name: Emily Reid MRN: [id number] DOB:1945-11-06, 76 y.o., female Today's Date: 02/12/2022   PT End of Session - 02/12/22 1607     Visit Number 11    Number of Visits 25    Date for PT Re-Evaluation 03/12/22    Authorization Type [employer] MCR    PT Start Time 2992    PT Stop Time 1625   reviewed exercises for D/C. Did not rquire full session   PT Time Calculation (min) 28 min    Activity Tolerance Patient tolerated treatment well    Behavior During Therapy WFL for tasks assessed/performed                   Past Medical History:  Diagnosis Date   Anxiety    Cataract    Mixed OU   Depression    Hyperlipidemia    Hypertension    Hypertensive retinopathy    OU   Left rotator cuff tear    Urine incontinence    Past Surgical History:  Procedure Laterality Date   FACIAL COSMETIC SURGERY     SHOULDER ARTHROSCOPY WITH SUBACROMIAL DECOMPRESSION, ROTATOR CUFF REPAIR AND BICEP TENDON REPAIR Left 11/17/2021   Procedure: LEFT SHOULDER ARTHROSCOPY WITH ROTATOR CUFF REPAIR AND BICEPS TENODESIS;  Surgeon: Vanetta Mulders, MD;  Location: [facility];  Service: Orthopedics;  Laterality: Left;   TONSILLECTOMY  1957   tummy tuck     Patient Active Problem List   Diagnosis Date Noted   Biceps tendonosis of left shoulder    Nontraumatic incomplete tear of right rotator cuff    Numbness of toes 11/27/2020   Stage 3b chronic kidney disease ([place]) 11/04/2020   Routine general medical examination at a health care facility 10/31/2020   Visit for screening mammogram 10/28/2020   Primary hypertension 10/28/2020   Hyperlipidemia LDL goal <130 10/28/2020   HIP PAIN 12/10/2008   ILIOTIBIAL BAND SYNDROME 12/10/2008    PCP: Ardeth Perfect, MD  REFERRING PROVIDER: Vanetta Mulders, MD  REFERRING DIAG: M75.111 (ICD-10-CM) - Nontraumatic incomplete tear of right rotator cuff  THERAPY DIAG:  Acute pain of  left shoulder  Muscle weakness (generalized)  Abnormal posture  Rationale for Evaluation and Treatment Rehabilitation  ONSET DATE: DOS 11/17/21  SUBJECTIVE:                                                                                                                                                                                      SUBJECTIVE STATEMENT: Patient continues to report no pain or difficulty with activity. She feels like she is ready for D/C   PERTINENT HISTORY:  none  PAIN:  Are you having pain? No 8/7   PRECAUTIONS: Shoulder  WEIGHT BEARING RESTRICTIONS Yes NWB  FALLS:  Has patient fallen in last 6 months? No  LIVING ENVIRONMENT: Lives with: lives with their spouse   OCCUPATION: Not working  PLOF: Independent  PATIENT GOALS aerobics, yoga, walking, I am active and I want to stay that way  OBJECTIVE:   PATIENT SURVEYS:  FOTO 4  UPPER EXTREMITY ROM:   Passive ROM Left eval Left 6/2  Left  7/25   Shoulder flexion 90 95 165   Shoulder extension     Shoulder abduction 45    Shoulder adduction     Shoulder internal rotation     Shoulder external rotation  15 without pushing to end range  60   Elbow flexion     Elbow extension     Wrist flexion     Wrist extension     Wrist ulnar deviation     Wrist radial deviation     Wrist pronation     Wrist supination     (Blank rows = not tested)  Active flexion 170  Active er behind the head without pain  Active IR to T-8    UPPER EXTREMITY MMT:  MMT Right  Left   Shoulder flexion    Shoulder extension    Shoulder abduction    Shoulder adduction    Shoulder internal rotation    Shoulder external rotation    Middle trapezius    Lower trapezius    Elbow flexion    Elbow extension    Wrist flexion    Wrist extension    Wrist ulnar deviation    Wrist radial deviation    Wrist pronation    Wrist supination    Grip strength (lbs)    (Blank rows = not tested)   Left  Shoulder  flexion 4+//5  Shoulder Er 4+/5  IR 5/5  TODAY'S TREATMENT:  8/18 Pulleys flexion 2 min, gentle scaption 1 min Supine flexion 1 lb 3x10   Side lying er x20 2b  T-band IR Green  x20  Scap retraction green x20  Shoulder extension x20 green  Supine ABC 2 lb  T-band ER x20 green    8/4 Pulleys flexion 2 min, gentle scaption 1 min Supine flexion 1 lb 3x10   Side lying er x20 2b  T-band IR Green  x20  Scap retraction green x20  Shoulder extension x20 green  Supine ABC 1 lb  T-band ER x20 green   Standing flexion in the mirror and scaption x10 without  1lb x10      7/28  Pulleys flexion 2 min, gentle scaption 1 min Wand flexion 1 lb 3x10   Side lying er 3x10 1lb  T-band IR yellow x20  Scap retraction red x20  Shoulder extension x20 red  Supine ABC 1 lb    7/25 Pulleys flexion 2 min, gentle scaption 1 min Wand flexion 1 lb 2x10   Side lying er x10 and x10 with 1 lb  T-band IR yellow x20  Scap retraction red x20  Shoulder extension x20 red  Supine ABC     7/5 Pulleys flexion 2 min, gentle scaption 1 min  Seated: Wand ER AAROM, x10 (elbow propped on pillow) Seated: W's (scap squeeze with gentle ER) x 10  Standing in front of mirror: AROM with LUE flexion and scaption below 70 (watching for scap hiking) Rolling small ball up wall into shoulder flexion x 5  Shoulder rolls Lateral neck flexion R/L Holding small ball with 2 hands:  -(bilat shoulders flexed ~40) with pronation / supination of forearms       -Elbows neutral to elbow flexion to shoulder flexion to raise ball to eye height -Scap squeeze with ball to diagonal motion (elbow flexion, ball towards shoulder/ elbow ext, ball to hip Holding small ball with L hand - moving just past neutral into ext  ]    PATIENT EDUCATION: Education details: HEP progression, pulleys, AAROM exercises  Person educated: Patient Education method: Explanation, Demonstration, Tactile cues, Verbal cues, and  Handouts Education comprehension: verbalized understanding, returned demonstration, verbal cues required, tactile cues required, and needs further education   HOME EXERCISE PROGRAM: [id number]  ASSESSMENT:  CLINICAL IMPRESSION: The patient has progressed very well. She feels like she is ready to D/C. She has met all goals. She is not having pain. She is back to her aerobics class and yoga. She has been working on her exercises. Therapy reviewed all of her exercises with her D/C to HEP at this time.    OBJECTIVE IMPAIRMENTS decreased activity tolerance, decreased ROM, decreased strength, increased muscle spasms, impaired UE functional use, improper body mechanics, postural dysfunction, and pain.   ACTIVITY LIMITATIONS carrying, lifting, bed mobility, bathing, dressing, reach over head, hygiene/grooming, and caring for others  PARTICIPATION LIMITATIONS: meal prep, cleaning, laundry, driving, community activity, and fitness  PERSONAL FACTORS none  REHAB POTENTIAL: Good  CLINICAL DECISION MAKING: Stable/uncomplicated  EVALUATION COMPLEXITY: Low   GOALS: Goals reviewed with patient? Yes  SHORT TERM GOALS: Target date: 12/18/2021  PROM to meet stated protocol goals Baseline: see chart Goal status: achieved   2.  Resting pain <=3/10 Baseline:  Goal status: Achieved   LONG TERM GOALS: Target date: 02/12/2022    Shoulder MMT at least 50% of opp UE per dynamometry testing Baseline:  Goal status: archived   2.  Able to perform full overhead motion with 2lb without elevation of shoulder Baseline:  Goal status: achieved   3.  Resolution of scapular winging Baseline:  Goal status: achieved   4.  Able to perform self care activities without assistance Baseline:  Goal status: Achieved   5.  Further goals to be set at this date   PLAN: PT FREQUENCY: 1-2x/week  PT DURATION: 12 weeks  PLANNED INTERVENTIONS: Therapeutic exercises, Therapeutic activity, Neuromuscular  re-education, Patient/Family education, Joint mobilization, Aquatic Therapy, Dry Needling, Electrical stimulation, Cryotherapy, Moist heat, Taping, Vasopneumatic device, Manual therapy, and Re-evaluation  PLAN FOR NEXT SESSION: review tolerance to new exercises   Carolyne Littles PT DPT  02/12/22 4:24 PM

## 2022-02-22 ENCOUNTER — Encounter (HOSPITAL_BASED_OUTPATIENT_CLINIC_OR_DEPARTMENT_OTHER): Payer: Medicare PPO | Admitting: Physical Therapy

## 2022-02-24 DIAGNOSIS — M25552 Pain in left hip: Secondary | ICD-10-CM | POA: Diagnosis not present

## 2022-02-26 ENCOUNTER — Encounter (HOSPITAL_BASED_OUTPATIENT_CLINIC_OR_DEPARTMENT_OTHER): Payer: Medicare PPO | Admitting: Physical Therapy

## 2022-03-02 ENCOUNTER — Encounter (HOSPITAL_BASED_OUTPATIENT_CLINIC_OR_DEPARTMENT_OTHER): Payer: Medicare PPO | Admitting: Physical Therapy

## 2022-03-05 ENCOUNTER — Encounter (HOSPITAL_BASED_OUTPATIENT_CLINIC_OR_DEPARTMENT_OTHER): Payer: Medicare PPO | Admitting: Physical Therapy

## 2022-03-08 ENCOUNTER — Encounter (HOSPITAL_BASED_OUTPATIENT_CLINIC_OR_DEPARTMENT_OTHER): Payer: Medicare PPO | Admitting: Physical Therapy

## 2022-03-10 DIAGNOSIS — M25552 Pain in left hip: Secondary | ICD-10-CM | POA: Diagnosis not present

## 2022-03-12 ENCOUNTER — Encounter (HOSPITAL_BASED_OUTPATIENT_CLINIC_OR_DEPARTMENT_OTHER): Payer: Medicare PPO | Admitting: Physical Therapy

## 2022-03-29 ENCOUNTER — Encounter (HOSPITAL_BASED_OUTPATIENT_CLINIC_OR_DEPARTMENT_OTHER): Payer: Medicare PPO | Admitting: Physical Therapy

## 2022-04-02 ENCOUNTER — Encounter (HOSPITAL_BASED_OUTPATIENT_CLINIC_OR_DEPARTMENT_OTHER): Payer: Medicare PPO | Admitting: Physical Therapy

## 2022-04-05 ENCOUNTER — Encounter (HOSPITAL_BASED_OUTPATIENT_CLINIC_OR_DEPARTMENT_OTHER): Payer: Medicare PPO | Admitting: Physical Therapy

## 2022-04-09 ENCOUNTER — Encounter (HOSPITAL_BASED_OUTPATIENT_CLINIC_OR_DEPARTMENT_OTHER): Payer: Medicare PPO | Admitting: Physical Therapy

## 2022-04-14 DIAGNOSIS — L821 Other seborrheic keratosis: Secondary | ICD-10-CM | POA: Diagnosis not present

## 2022-04-14 DIAGNOSIS — L570 Actinic keratosis: Secondary | ICD-10-CM | POA: Diagnosis not present

## 2022-04-20 NOTE — Progress Notes (Signed)
[facility] - Clinic Note  04/28/2022     CHIEF COMPLAINT Patient presents for Retina Follow Up   HISTORY OF PRESENT ILLNESS: Emily Reid is a 76 y.o. female who presents to the clinic today for:  HPI     Retina Follow Up   Patient presents with  CRVO/BRVO.  In right eye.  This started 11 weeks ago.  I, the attending physician,  performed the HPI with the patient and updated documentation appropriately.        Comments   Patient here for 11 weeks retina follow up for BRVO OD.  Patient states vision doing good. No eye pain.       Last edited by Rennis Chris, MD on 04/28/2022 12:31 PM.    Pt states vision is "good"   Referring physician: Lois Huxley MD [address] [place], [place] [address]  HISTORICAL INFORMATION:   Selected notes from the MEDICAL RECORD NUMBER Referred by Dr. Harless Litten for continuation of eye injections OD for BRVO with edema LEE: 08/05/2020 Ocular Hx- BRVO, CME, iritis OD PMH- HTN   CURRENT MEDICATIONS: No current outpatient medications on file. (Ophthalmic Drugs)   No current facility-administered medications for this visit. (Ophthalmic Drugs)   Current Outpatient Medications (Other)  Medication Sig   amLODipine (NORVASC) 10 MG tablet Take 1 tablet (10 mg total) by mouth daily.   buPROPion (WELLBUTRIN XL) 150 MG 24 hr tablet Take 150 mg by mouth daily.   Coenzyme Q10 (COQ10) 100 MG CAPS Take by mouth daily.   LOSARTAN POTASSIUM PO Take 1 tablet by mouth daily.   mirtazapine (REMERON) 15 MG tablet TAKE 1 TABLET AT BEDTIME   Multiple Vitamin (MULTIVITAMIN) tablet Take 1 tablet by mouth daily.   Phosphatidylserine 100 MG CAPS Take by mouth.   rosuvastatin (CRESTOR) 20 MG tablet Take 20 mg by mouth daily.   sertraline (ZOLOFT) 50 MG tablet TAKE 1 TABLET EVERY DAY   Turmeric (QC TUMERIC COMPLEX PO) Take 553 mg by mouth daily.   vitamin C (ASCORBIC ACID) 500 MG tablet Take 500 mg by mouth daily.   aspirin EC 325  MG tablet Take 1 tablet (325 mg total) by mouth daily. (Patient not taking: Reported on 11/13/2021)   oxyCODONE (OXY IR/ROXICODONE) 5 MG immediate release tablet Take 1 tablet (5 mg total) by mouth every 4 (four) hours as needed (severe pain). (Patient not taking: Reported on 11/13/2021)   No current facility-administered medications for this visit. (Other)   REVIEW OF SYSTEMS: ROS   Positive for: Genitourinary, Eyes Negative for: Constitutional, Gastrointestinal, Neurological, Skin, Musculoskeletal, HENT, Endocrine, Cardiovascular, Respiratory, Psychiatric, Allergic/Imm, Heme/Lymph Last edited by Laddie Aquas, COA on 04/28/2022 10:09 AM.      ALLERGIES No Known Allergies  PAST MEDICAL HISTORY Past Medical History:  Diagnosis Date   Anxiety    Cataract    Mixed OU   Depression    Hyperlipidemia    Hypertension    Hypertensive retinopathy    OU   Left rotator cuff tear    Urine incontinence    Past Surgical History:  Procedure Laterality Date   FACIAL COSMETIC SURGERY     SHOULDER ARTHROSCOPY WITH SUBACROMIAL DECOMPRESSION, ROTATOR CUFF REPAIR AND BICEP TENDON REPAIR Left 11/17/2021   Procedure: LEFT SHOULDER ARTHROSCOPY WITH ROTATOR CUFF REPAIR AND BICEPS TENODESIS;  Surgeon: Huel Cote, MD;  Location: [facility];  Service: Orthopedics;  Laterality: Left;   TONSILLECTOMY  1957   tummy  tuck     FAMILY HISTORY Family History  Problem Relation Age of Onset   Heart disease Mother    Hyperlipidemia Mother    Hypertension Mother    Stroke Mother    SOCIAL HISTORY Social History   Tobacco Use   Smoking status: Never   Smokeless tobacco: Never  Vaping Use   Vaping Use: Never used  Substance Use Topics   Alcohol use: Yes    Alcohol/week: 7.0 standard drinks of alcohol    Types: 7 Glasses of wine per week    Comment: social   Drug use: No       OPHTHALMIC EXAM:  Base Eye Exam     Visual Acuity (Snellen - Linear)       Right Left   Dist  cc 20/20 -1 20/20    Correction: Glasses         Tonometry (Tonopen, 10:08 AM)       Right Left   Pressure 18 17         Pupils       Dark Light Shape React APD   Right 3 2 Round Brisk None   Left 3 2 Round Brisk None         Visual Fields (Counting fingers)       Left Right    Full Full         Extraocular Movement       Right Left    Full, Ortho Full, Ortho         Neuro/Psych     Oriented x3: Yes   Mood/Affect: Normal         Dilation     Both eyes: 1.0% Mydriacyl, 2.5% Phenylephrine @ 10:07 AM           Slit Lamp and Fundus Exam     Slit Lamp Exam       Right Left   Lids/Lashes Dermatochalasis - upper lid Dermatochalasis - upper lid   Conjunctiva/Sclera White and quiet White and quiet   Cornea 1+ Punctate epithelial erosions, early band K nasal and temporal 1+ Punctate epithelial erosions, early band K nasal and temporal   Anterior Chamber Deep and quiet Deep and quiet   Iris Round and dilated Round and dilated   Lens 2+ Nuclear sclerosis, 1-2+ Cortical cataract, focal Posterior subcapsular cataract at 0700 2+ Nuclear sclerosis, 2+ Cortical cataract, focal Posterior subcapsular cataract at 0700   Anterior Vitreous Vitreous syneresis, silicone oil micro bubbles Vitreous syneresis, silicone oil micro bubbles, Posterior vitreous detachment         Fundus Exam       Right Left   Disc Pink and Sharp, mild PPA Pink and Sharp   C/D Ratio 0.2 0.2   Macula Flat, Blunted foveal reflex, Drusen, RPE mottling, No heme, interval increase in cystic changes inferior macula and fovea Flat, good foveal reflex, Drusen, RPE mottling and clumping, No heme or edema   Vessels mild attenuation, mild tortuosity attenuated, mild tortuosity   Periphery Attached, no heme Attached, mild mid zonal drusen, no heme              Refraction     Wearing Rx       Sphere Cylinder Axis Add   Right +0.25 +0.50 023 +2.50   Left +2.00 +0.25 030 +2.50  New  RX          IMAGING AND PROCEDURES  Imaging and Procedures for 04/28/2022  OCT, Retina - OU -  Both Eyes       Right Eye Quality was good. Central Foveal Thickness: 305. Progression has worsened. Findings include normal foveal contour, no SRF, abnormal foveal contour, intraretinal fluid (Hx of BRVO, interval increase in IRF inferior fovea and macula).   Left Eye Quality was good. Central Foveal Thickness: 297. Progression has been stable. Findings include normal foveal contour, no IRF, no SRF, retinal drusen .   Notes *Images captured and stored on drive  Diagnosis / Impression:  OD: Hx of BRVO, interval increase in IRF inferior fovea and macula OS: NFP, no IRF/SRF, +drusen  Clinical management:  See below  Abbreviations: NFP - Normal foveal profile. CME - cystoid macular edema. PED - pigment epithelial detachment. IRF - intraretinal fluid. SRF - subretinal fluid. EZ - ellipsoid zone. ERM - epiretinal membrane. ORA - outer retinal atrophy. ORT - outer retinal tubulation. SRHM - subretinal hyper-reflective material. IRHM - intraretinal hyper-reflective material      Intravitreal Injection, Pharmacologic Agent - OD - Right Eye       Time Out 04/28/2022. 10:59 AM. Confirmed correct patient, procedure, site, and patient consented.   Anesthesia Topical anesthesia was used. Anesthetic medications included Lidocaine 2%, Proparacaine 0.5%.   Procedure Preparation included 5% betadine to ocular surface, eyelid speculum. A (32g) needle was used.   Injection: 1.25 mg Bevacizumab 1.25mg /0.28ml   Route: Intravitreal, Site: Right Eye   NDC: [id number], Lot: [id number], Expiration date: 05/27/2022   Post-op Post injection exam found visual acuity of at least counting fingers. The patient tolerated the procedure well. There were no complications. The patient received written and verbal post procedure care education. Post injection medications were not given.             ASSESSMENT/PLAN:    ICD-10-CM   1. Branch retinal vein occlusion of right eye with macular edema  H34.8310 OCT, Retina - OU - Both Eyes    Intravitreal Injection, Pharmacologic Agent - OD - Right Eye    Bevacizumab (AVASTIN) SOLN 1.25 mg    2. Essential hypertension  I10     3. Hypertensive retinopathy of both eyes  H35.033     4. Combined forms of age-related cataract of both eyes  H25.813      1. BRVO w/ CME OD             - s/p IVA OD #1 (03.30.22), #2 (05.23.22), #3 (7.25.22), #4 (10.05.22), #5 (12.14.22), #6 (03.15.23), #7 (05.31.23), #8 (08.16.23)  - hx of injections with Dr. Harless Litten in [place], [place] -- pt was receiving Lucentis q7-8 wks; pt reports history Avastin, but was switched to Lucentis at some point - last injection IVL OD was 02.04.22 (7.5 wks prior to initial visit here) - pt reports hx of worsening vision and OCT around 10 weeks - **worsening of IRF on OCT at 13 weeks on 03.15.23; 11 wks on 11.01.23** - BCVA OD is 20/20 (stable) - OCT today shows interval increase in IRF inferior fovea and macula at 11 weeks - recommend IVA OD #9 today, 11.01.23 -- w/ f/u back to 9 wks - RBA of procedure discussed, questions answered - IVA informed consent obtained and signed, 08.16.23 (OD) - see procedure note - F/U 9 weeks -- DFE/OCT/possible injection  2,3. Hypertensive retinopathy OU - discussed importance of tight BP control - continue to monitor  4. Mixed Cataract OU - The symptoms of cataract, surgical options, and treatments and risks were discussed with patient. - discussed diagnosis and progression - not yet  visually significant - monitor for now  Ophthalmic Meds Ordered this visit:  Meds ordered this encounter  Medications   Bevacizumab (AVASTIN) SOLN 1.25 mg     Return in about 9 weeks (around 06/30/2022) for f/u BRVO OD, DFE, OCT.  There are no Patient Instructions on file for this visit.  This document serves as a record of services personally performed  by Gardiner Sleeper, MD, PhD. It was created on their behalf by Orvan Falconer, an ophthalmic technician. The creation of this record is the provider's dictation and/or activities during the visit.    Electronically signed by: Orvan Falconer, OA, 04/28/22  12:33 PM  This document serves as a record of services personally performed by Gardiner Sleeper, MD, PhD. It was created on their behalf by San Jetty. Owens Shark, OA an ophthalmic technician. The creation of this record is the provider's dictation and/or activities during the visit.    Electronically signed by: San Jetty. Marguerita Merles 11.01.2023 12:33 PM   Gardiner Sleeper, M.D., Ph.D. Diseases & Surgery of the Retina and Vitreous [facility]  I have reviewed the above documentation for accuracy and completeness, and I agree with the above. Gardiner Sleeper, M.D., Ph.D. 04/28/22 12:46 PM   Abbreviations: M myopia (nearsighted); A astigmatism; H hyperopia (farsighted); P presbyopia; Mrx spectacle prescription;  CTL contact lenses; OD right eye; OS left eye; OU both eyes  XT exotropia; ET esotropia; PEK punctate epithelial keratitis; PEE punctate epithelial erosions; DES dry eye syndrome; MGD meibomian gland dysfunction; ATs artificial tears; PFAT's preservative free artificial tears; [place] nuclear sclerotic cataract; PSC posterior subcapsular cataract; ERM epi-retinal membrane; PVD posterior vitreous detachment; RD retinal detachment; DM diabetes mellitus; DR diabetic retinopathy; NPDR non-proliferative diabetic retinopathy; PDR proliferative diabetic retinopathy; CSME clinically significant macular edema; DME diabetic macular edema; dbh dot blot hemorrhages; CWS cotton wool spot; POAG primary open angle glaucoma; C/D cup-to-disc ratio; HVF humphrey visual field; GVF goldmann visual field; OCT optical coherence tomography; IOP intraocular pressure; BRVO Branch retinal vein occlusion; CRVO central retinal vein occlusion; CRAO central  retinal artery occlusion; BRAO branch retinal artery occlusion; RT retinal tear; SB scleral buckle; PPV pars plana vitrectomy; VH Vitreous hemorrhage; PRP panretinal laser photocoagulation; IVK intravitreal kenalog; VMT vitreomacular traction; MH Macular hole;  NVD neovascularization of the disc; NVE neovascularization elsewhere; AREDS age related eye disease study; ARMD age related macular degeneration; POAG primary open angle glaucoma; EBMD epithelial/anterior basement membrane dystrophy; ACIOL anterior chamber intraocular lens; IOL intraocular lens; PCIOL posterior chamber intraocular lens; Phaco/IOL phacoemulsification with intraocular lens placement; [place] photorefractive keratectomy; LASIK laser assisted in situ keratomileusis; HTN hypertension; DM diabetes mellitus; COPD chronic obstructive pulmonary disease

## 2022-04-28 ENCOUNTER — Encounter (INDEPENDENT_AMBULATORY_CARE_PROVIDER_SITE_OTHER): Payer: Self-pay | Admitting: Ophthalmology

## 2022-04-28 ENCOUNTER — Ambulatory Visit (INDEPENDENT_AMBULATORY_CARE_PROVIDER_SITE_OTHER): Payer: Medicare PPO | Admitting: Ophthalmology

## 2022-04-28 DIAGNOSIS — H35033 Hypertensive retinopathy, bilateral: Secondary | ICD-10-CM | POA: Diagnosis not present

## 2022-04-28 DIAGNOSIS — H25813 Combined forms of age-related cataract, bilateral: Secondary | ICD-10-CM | POA: Diagnosis not present

## 2022-04-28 DIAGNOSIS — H34831 Tributary (branch) retinal vein occlusion, right eye, with macular edema: Secondary | ICD-10-CM | POA: Diagnosis not present

## 2022-04-28 DIAGNOSIS — I10 Essential (primary) hypertension: Secondary | ICD-10-CM

## 2022-04-28 MED ORDER — BEVACIZUMAB CHEMO INJECTION 1.25MG/0.05ML SYRINGE FOR KALEIDOSCOPE
1.2500 mg | INTRAVITREAL | Status: AC | PRN
  Administered 2022-04-28: 1.25 mg via INTRAVITREAL

## 2022-05-10 ENCOUNTER — Ambulatory Visit (INDEPENDENT_AMBULATORY_CARE_PROVIDER_SITE_OTHER): Payer: Medicare PPO | Admitting: Orthopaedic Surgery

## 2022-05-10 DIAGNOSIS — M75111 Incomplete rotator cuff tear or rupture of right shoulder, not specified as traumatic: Secondary | ICD-10-CM | POA: Diagnosis not present

## 2022-05-10 NOTE — Progress Notes (Signed)
Post Operative Evaluation    Procedure/Date of Surgery: Left shoulder rotator cuff repair and biceps tenodesis 11/17/21  Interval History:   Presents today for follow-up 6 months status post the above procedure.  Overall she is back to feeling essentially 100%.  She has no pain at this time.  She is doing all activities without pain and with full strength.  PMH/PSH/Family History/Social History/Meds/Allergies:    Past Medical History:  Diagnosis Date   Anxiety    Cataract    Mixed OU   Depression    Hyperlipidemia    Hypertension    Hypertensive retinopathy    OU   Left rotator cuff tear    Urine incontinence    Past Surgical History:  Procedure Laterality Date   FACIAL COSMETIC SURGERY     SHOULDER ARTHROSCOPY WITH SUBACROMIAL DECOMPRESSION, ROTATOR CUFF REPAIR AND BICEP TENDON REPAIR Left 11/17/2021   Procedure: LEFT SHOULDER ARTHROSCOPY WITH ROTATOR CUFF REPAIR AND BICEPS TENODESIS;  Surgeon: Huel Cote, MD;  Location: [facility];  Service: Orthopedics;  Laterality: Left;   TONSILLECTOMY  1957   tummy tuck     Social History   Socioeconomic History   Marital status: Married    Spouse name: Not on file   Number of children: Not on file   Years of education: Not on file   Highest education level: Not on file  Occupational History   Not on file  Tobacco Use   Smoking status: Never   Smokeless tobacco: Never  Vaping Use   Vaping Use: Never used  Substance and Sexual Activity   Alcohol use: Yes    Alcohol/week: 7.0 standard drinks of alcohol    Types: 7 Glasses of wine per week    Comment: social   Drug use: No   Sexual activity: Yes    Partners: Male    Birth control/protection: Post-menopausal  Other Topics Concern   Not on file  Social History Narrative   Not on file   Social Determinants of Health   Financial Resource Strain: Not on file  Food Insecurity: Not on file  Transportation Needs: Not on  file  Physical Activity: Not on file  Stress: Not on file  Social Connections: Not on file   Family History  Problem Relation Age of Onset   Heart disease Mother    Hyperlipidemia Mother    Hypertension Mother    Stroke Mother    No Known Allergies Current Outpatient Medications  Medication Sig Dispense Refill   amLODipine (NORVASC) 10 MG tablet Take 1 tablet (10 mg total) by mouth daily. 90 tablet 1   aspirin EC 325 MG tablet Take 1 tablet (325 mg total) by mouth daily. (Patient not taking: Reported on 11/13/2021) 30 tablet 0   buPROPion (WELLBUTRIN XL) 150 MG 24 hr tablet Take 150 mg by mouth daily.     Coenzyme Q10 (COQ10) 100 MG CAPS Take by mouth daily.     LOSARTAN POTASSIUM PO Take 1 tablet by mouth daily.     mirtazapine (REMERON) 15 MG tablet TAKE 1 TABLET AT BEDTIME 90 tablet 1   Multiple Vitamin (MULTIVITAMIN) tablet Take 1 tablet by mouth daily.     oxyCODONE (OXY IR/ROXICODONE) 5 MG immediate release tablet Take 1 tablet (5 mg total) by mouth every 4 (four) hours  as needed (severe pain). (Patient not taking: Reported on 11/13/2021) 20 tablet 0   Phosphatidylserine 100 MG CAPS Take by mouth.     rosuvastatin (CRESTOR) 20 MG tablet Take 20 mg by mouth daily.     sertraline (ZOLOFT) 50 MG tablet TAKE 1 TABLET EVERY DAY 90 tablet 1   Turmeric (QC TUMERIC COMPLEX PO) Take 553 mg by mouth daily.     vitamin C (ASCORBIC ACID) 500 MG tablet Take 500 mg by mouth daily.     No current facility-administered medications for this visit.   No results found.  Review of Systems:   A ROS was performed including pertinent positives and negatives as documented in the HPI.   Musculoskeletal Exam:    There were no vitals taken for this visit.  Left incisions about the shoulder are well-healed.  Left shoulder active range of motion is to approximately 170 degrees compared to 170 on the right.  External rotation at the side is to 60 degrees bilaterally.  Internal rotation is to T4  bilaterally.  Strength in forward elevation and external rotation is equal to the contralateral side  Imaging:    None  I personally reviewed and interpreted the radiographs.   Assessment:   76 year old female who is 6 months status post left shoulder arthroscopy with rotator cuff repair and biceps tenodesis.  At this time she has completed the rehab protocol and is doing much better.  She does not have any strength deficits at this time.  I will plan to see her back on an as-needed basis.  Plan :    -She will return to clinic as needed      I personally saw and evaluated the patient, and participated in the management and treatment plan.  Huel Cote, MD Attending Physician, Orthopedic Surgery  This document was dictated using Dragon voice recognition software. A reasonable attempt at proof reading has been made to minimize errors.

## 2022-05-17 NOTE — Telephone Encounter (Signed)
Refill request came through for patient. Patient needing an appt prior to any refills

## 2022-06-16 DIAGNOSIS — L814 Other melanin hyperpigmentation: Secondary | ICD-10-CM | POA: Diagnosis not present

## 2022-06-16 DIAGNOSIS — D1801 Hemangioma of skin and subcutaneous tissue: Secondary | ICD-10-CM | POA: Diagnosis not present

## 2022-06-16 DIAGNOSIS — L570 Actinic keratosis: Secondary | ICD-10-CM | POA: Diagnosis not present

## 2022-06-16 DIAGNOSIS — L578 Other skin changes due to chronic exposure to nonionizing radiation: Secondary | ICD-10-CM | POA: Diagnosis not present

## 2022-06-16 DIAGNOSIS — B079 Viral wart, unspecified: Secondary | ICD-10-CM | POA: Diagnosis not present

## 2022-06-16 DIAGNOSIS — D229 Melanocytic nevi, unspecified: Secondary | ICD-10-CM | POA: Diagnosis not present

## 2022-06-16 DIAGNOSIS — L821 Other seborrheic keratosis: Secondary | ICD-10-CM | POA: Diagnosis not present

## 2022-06-16 DIAGNOSIS — D485 Neoplasm of uncertain behavior of skin: Secondary | ICD-10-CM | POA: Diagnosis not present

## 2022-06-30 ENCOUNTER — Encounter (INDEPENDENT_AMBULATORY_CARE_PROVIDER_SITE_OTHER): Payer: Medicare PPO | Admitting: Ophthalmology

## 2022-06-30 NOTE — Progress Notes (Shared)
[facility] Note  07/05/2022     CHIEF COMPLAINT Patient presents for No chief complaint on file.   HISTORY OF PRESENT ILLNESS: Emily Reid is a 77 y.o. female who presents to the clinic today for:      Referring physician: Melbourne Abts MD [address], [place] [id number]  HISTORICAL INFORMATION:   Selected notes from the MEDICAL RECORD NUMBER Referred by Dr. Joni Fears for continuation of eye injections OD for BRVO with edema LEE: 08/05/2020 Ocular Hx- BRVO, CME, iritis OD PMH- HTN   CURRENT MEDICATIONS: No current outpatient medications on file. (Ophthalmic Drugs)   No current facility-administered medications for this visit. (Ophthalmic Drugs)   Current Outpatient Medications (Other)  Medication Sig   amLODipine (NORVASC) 10 MG tablet Take 1 tablet (10 mg total) by mouth daily.   aspirin EC 325 MG tablet Take 1 tablet (325 mg total) by mouth daily. (Patient not taking: Reported on 11/13/2021)   buPROPion (WELLBUTRIN XL) 150 MG 24 hr tablet Take 150 mg by mouth daily.   Coenzyme Q10 (COQ10) 100 MG CAPS Take by mouth daily.   LOSARTAN POTASSIUM PO Take 1 tablet by mouth daily.   mirtazapine (REMERON) 15 MG tablet TAKE 1 TABLET AT BEDTIME   Multiple Vitamin (MULTIVITAMIN) tablet Take 1 tablet by mouth daily.   oxyCODONE (OXY IR/ROXICODONE) 5 MG immediate release tablet Take 1 tablet (5 mg total) by mouth every 4 (four) hours as needed (severe pain). (Patient not taking: Reported on 11/13/2021)   Phosphatidylserine 100 MG CAPS Take by mouth.   rosuvastatin (CRESTOR) 20 MG tablet Take 20 mg by mouth daily.   sertraline (ZOLOFT) 50 MG tablet TAKE 1 TABLET EVERY DAY   Turmeric (QC TUMERIC COMPLEX PO) Take 553 mg by mouth daily.   vitamin C (ASCORBIC ACID) 500 MG tablet Take 500 mg by mouth daily.   No current facility-administered medications for this visit. (Other)   REVIEW OF SYSTEMS:    ALLERGIES No Known  Allergies  PAST MEDICAL HISTORY Past Medical History:  Diagnosis Date   Anxiety    Cataract    Mixed OU   Depression    Hyperlipidemia    Hypertension    Hypertensive retinopathy    OU   Left rotator cuff tear    Urine incontinence    Past Surgical History:  Procedure Laterality Date   FACIAL COSMETIC SURGERY     SHOULDER ARTHROSCOPY WITH SUBACROMIAL DECOMPRESSION, ROTATOR CUFF REPAIR AND BICEP TENDON REPAIR Left 11/17/2021   Procedure: LEFT SHOULDER ARTHROSCOPY WITH ROTATOR CUFF REPAIR AND BICEPS TENODESIS;  Surgeon: Vanetta Mulders, MD;  Location: [facility];  Service: Orthopedics;  Laterality: Left;   TONSILLECTOMY  1957   tummy tuck     FAMILY HISTORY Family History  Problem Relation Age of Onset   Heart disease Mother    Hyperlipidemia Mother    Hypertension Mother    Stroke Mother    SOCIAL HISTORY Social History   Tobacco Use   Smoking status: Never   Smokeless tobacco: Never  Vaping Use   Vaping Use: Never used  Substance Use Topics   Alcohol use: Yes    Alcohol/week: 7.0 standard drinks of alcohol    Types: 7 Glasses of wine per week    Comment: social   Drug use: No       OPHTHALMIC EXAM:  Not recorded    IMAGING AND PROCEDURES  Imaging and Procedures for  07/05/2022          ASSESSMENT/PLAN:  No diagnosis found.  1. BRVO w/ CME OD             - s/p IVA OD #1 (03.30.22), #2 (05.23.22), #3 (7.25.22), #4 (10.05.22), #5 (12.14.22), #6 (03.15.23), #7 (05.31.23), #8 (08.16.23), #9 (11.01.23)  - hx of injections with Dr. Joni Fears in [place], [place] -- pt was receiving Lucentis q7-8 wks; pt reports history Avastin, but was switched to Lucentis at some point - last injection IVL OD was 02.04.22 (7.5 wks prior to initial visit here) - pt reports hx of worsening vision and OCT around 10 weeks - **worsening of IRF on OCT at 13 weeks on 03.15.23; 11 wks on 11.01.23** - BCVA OD is 20/20 (stable) - OCT today shows interval increase in IRF  inferior fovea and macula at 11 weeks - recommend IVA OD #10 today, 01.08.24 -- w/ f/u back to 9 wks - RBA of procedure discussed, questions answered - IVA informed consent obtained and signed, 08.16.23 (OD) - see procedure note - F/U 9 weeks -- DFE/OCT/possible injection  2,3. Hypertensive retinopathy OU - discussed importance of tight BP control - continue to monitor  4. Mixed Cataract OU - The symptoms of cataract, surgical options, and treatments and risks were discussed with patient. - discussed diagnosis and progression - not yet visually significant - monitor for now  Ophthalmic Meds Ordered this visit:  No orders of the defined types were placed in this encounter.    No follow-ups on file.  There are no Patient Instructions on file for this visit.  This document serves as a record of services personally performed by Gardiner Sleeper, MD, PhD. It was created on their behalf by Orvan Falconer, an ophthalmic technician. The creation of this record is the provider's dictation and/or activities during the visit.    Electronically signed by: Orvan Falconer, OA, 06/30/22  8:31 AM    Gardiner Sleeper, M.D., Ph.D. Diseases & Surgery of the Retina and Vitreous [facility]: M myopia (nearsighted); A astigmatism; H hyperopia (farsighted); P presbyopia; Mrx spectacle prescription;  CTL contact lenses; OD right eye; OS left eye; OU both eyes  XT exotropia; ET esotropia; PEK punctate epithelial keratitis; PEE punctate epithelial erosions; DES dry eye syndrome; MGD meibomian gland dysfunction; ATs artificial tears; PFAT's preservative free artificial tears; [place] nuclear sclerotic cataract; PSC posterior subcapsular cataract; ERM epi-retinal membrane; PVD posterior vitreous detachment; RD retinal detachment; DM diabetes mellitus; DR diabetic retinopathy; NPDR non-proliferative diabetic retinopathy; PDR proliferative diabetic retinopathy; CSME  clinically significant macular edema; DME diabetic macular edema; dbh dot blot hemorrhages; CWS cotton wool spot; POAG primary open angle glaucoma; C/D cup-to-disc ratio; HVF humphrey visual field; GVF goldmann visual field; OCT optical coherence tomography; IOP intraocular pressure; BRVO Branch retinal vein occlusion; CRVO central retinal vein occlusion; CRAO central retinal artery occlusion; BRAO branch retinal artery occlusion; RT retinal tear; SB scleral buckle; PPV pars plana vitrectomy; VH Vitreous hemorrhage; PRP panretinal laser photocoagulation; IVK intravitreal kenalog; VMT vitreomacular traction; MH Macular hole;  NVD neovascularization of the disc; NVE neovascularization elsewhere; AREDS age related eye disease study; ARMD age related macular degeneration; POAG primary open angle glaucoma; EBMD epithelial/anterior basement membrane dystrophy; ACIOL anterior chamber intraocular lens; IOL intraocular lens; PCIOL posterior chamber intraocular lens; Phaco/IOL phacoemulsification with intraocular lens placement; [place] photorefractive keratectomy; LASIK laser assisted in situ keratomileusis; HTN hypertension; DM diabetes mellitus; COPD chronic obstructive pulmonary disease

## 2022-07-02 DIAGNOSIS — Z411 Encounter for cosmetic surgery: Secondary | ICD-10-CM | POA: Diagnosis not present

## 2022-07-02 DIAGNOSIS — L570 Actinic keratosis: Secondary | ICD-10-CM | POA: Diagnosis not present

## 2022-07-05 ENCOUNTER — Encounter (INDEPENDENT_AMBULATORY_CARE_PROVIDER_SITE_OTHER): Payer: Medicare PPO | Admitting: Ophthalmology

## 2022-07-05 DIAGNOSIS — H25813 Combined forms of age-related cataract, bilateral: Secondary | ICD-10-CM

## 2022-07-05 DIAGNOSIS — H34831 Tributary (branch) retinal vein occlusion, right eye, with macular edema: Secondary | ICD-10-CM

## 2022-07-05 DIAGNOSIS — H35033 Hypertensive retinopathy, bilateral: Secondary | ICD-10-CM

## 2022-07-05 DIAGNOSIS — I10 Essential (primary) hypertension: Secondary | ICD-10-CM

## 2022-07-21 NOTE — Progress Notes (Signed)
[facility] Note  07/28/2022    CHIEF COMPLAINT Patient presents for Retina Follow Up   HISTORY OF PRESENT ILLNESS: Emily Reid is a 77 y.o. female who presents to the clinic today for:  HPI     Retina Follow Up   Patient presents with  CRVO/BRVO.  In right eye.  This started 13 weeks ago.  I, the attending physician,  performed the HPI with the patient and updated documentation appropriately.        Comments   Patient here for 9 weeks (13 weeks) retina follow up for BRVO OD. Patient states vision not great. Has a big blurry spot middle of OD. No eye pain. Uses systane drops prn.      Last edited by Bernarda Caffey, MD on 07/28/2022  2:32 PM.    Pt is delayed to follow up from 9 weeks to 13 weeks due to her stepmothers passing, she states she noticed a decrease in [place] about 3 weeks   Referring physician: Melbourne Abts MD [place], [place] [address]  HISTORICAL INFORMATION:   Selected notes from the MEDICAL RECORD NUMBER Referred by Dr. Joni Fears for continuation of eye injections OD for BRVO with edema LEE: 08/05/2020 Ocular Hx- BRVO, CME, iritis OD PMH- HTN   CURRENT MEDICATIONS: No current outpatient medications on file. (Ophthalmic Drugs)   No current facility-administered medications for this visit. (Ophthalmic Drugs)   Current Outpatient Medications (Other)  Medication Sig   amLODipine (NORVASC) 10 MG tablet Take 1 tablet (10 mg total) by mouth daily.   buPROPion (WELLBUTRIN XL) 150 MG 24 hr tablet Take 150 mg by mouth daily.   Coenzyme Q10 (COQ10) 100 MG CAPS Take by mouth daily.   LOSARTAN POTASSIUM PO Take 1 tablet by mouth daily.   mirtazapine (REMERON) 15 MG tablet TAKE 1 TABLET AT BEDTIME   Multiple Vitamin (MULTIVITAMIN) tablet Take 1 tablet by mouth daily.   Phosphatidylserine 100 MG CAPS Take by mouth.   rosuvastatin (CRESTOR) 20 MG tablet Take 20 mg by mouth daily.   sertraline (ZOLOFT) 50 MG tablet  TAKE 1 TABLET EVERY DAY   Turmeric (QC TUMERIC COMPLEX PO) Take 553 mg by mouth daily.   vitamin C (ASCORBIC ACID) 500 MG tablet Take 500 mg by mouth daily.   aspirin EC 325 MG tablet Take 1 tablet (325 mg total) by mouth daily. (Patient not taking: Reported on 11/13/2021)   oxyCODONE (OXY IR/ROXICODONE) 5 MG immediate release tablet Take 1 tablet (5 mg total) by mouth every 4 (four) hours as needed (severe pain). (Patient not taking: Reported on 11/13/2021)   No current facility-administered medications for this visit. (Other)   REVIEW OF SYSTEMS: ROS   Positive for: Genitourinary, Eyes Negative for: Constitutional, Gastrointestinal, Neurological, Skin, Musculoskeletal, HENT, Endocrine, Cardiovascular, Respiratory, Psychiatric, Allergic/Imm, Heme/Lymph Last edited by Theodore Demark, COA on 07/28/2022  1:43 PM.     ALLERGIES No Known Allergies  PAST MEDICAL HISTORY Past Medical History:  Diagnosis Date   Anxiety    Cataract    Mixed OU   Depression    Hyperlipidemia    Hypertension    Hypertensive retinopathy    OU   Left rotator cuff tear    Urine incontinence    Past Surgical History:  Procedure Laterality Date   FACIAL COSMETIC SURGERY     SHOULDER ARTHROSCOPY WITH SUBACROMIAL DECOMPRESSION, ROTATOR CUFF REPAIR AND BICEP TENDON REPAIR Left 11/17/2021   Procedure:  LEFT SHOULDER ARTHROSCOPY WITH ROTATOR CUFF REPAIR AND BICEPS TENODESIS;  Surgeon: Huel Cote, MD;  Location: [facility];  Service: Orthopedics;  Laterality: Left;   TONSILLECTOMY  1957   tummy tuck     FAMILY HISTORY Family History  Problem Relation Age of Onset   Heart disease Mother    Hyperlipidemia Mother    Hypertension Mother    Stroke Mother    SOCIAL HISTORY Social History   Tobacco Use   Smoking status: Never   Smokeless tobacco: Never  Vaping Use   Vaping Use: Never used  Substance Use Topics   Alcohol use: Yes    Alcohol/week: 7.0 standard drinks of alcohol     Types: 7 Glasses of wine per week    Comment: social   Drug use: No       OPHTHALMIC EXAM:  Base Eye Exam     Visual Acuity (Snellen - Linear)       Right Left   Dist cc 20/40 +1 20/20   Dist ph cc NI     Correction: Glasses         Tonometry (Tonopen, 1:40 PM)       Right Left   Pressure 15 14         Pupils       Dark Light Shape React APD   Right 3 2 Round Brisk None   Left 3 2 Round Brisk None         Visual Fields (Counting fingers)       Left Right    Full Full         Extraocular Movement       Right Left    Full, Ortho Full, Ortho         Neuro/Psych     Oriented x3: Yes   Mood/Affect: Normal         Dilation     Both eyes: 1.0% Mydriacyl, 2.5% Phenylephrine @ 1:40 PM           Slit Lamp and Fundus Exam     Slit Lamp Exam       Right Left   Lids/Lashes Dermatochalasis - upper lid Dermatochalasis - upper lid   Conjunctiva/Sclera White and quiet White and quiet   Cornea 1+ Punctate epithelial erosions, early band K nasal and temporal 1+ Punctate epithelial erosions, early band K nasal and temporal   Anterior Chamber Deep and quiet Deep and quiet   Iris Round and dilated Round and dilated   Lens 2+ Nuclear sclerosis, 1-2+ Cortical cataract, focal Posterior subcapsular cataract at 0700 2+ Nuclear sclerosis, 2+ Cortical cataract, focal Posterior subcapsular cataract at 0700   Anterior Vitreous Vitreous syneresis, silicone oil micro bubbles Vitreous syneresis, silicone oil micro bubbles, Posterior vitreous detachment         Fundus Exam       Right Left   Disc Pink and Sharp, mild PPA Pink and Sharp   C/D Ratio 0.2 0.2   Macula Flat, Blunted foveal reflex, Drusen, RPE mottling, No heme, interval increase in cystic changes / edema inferior macula and fovea Flat, good foveal reflex, Drusen, RPE mottling and clumping, No heme or edema   Vessels mild attenuation, mild tortuosity attenuated, mild tortuosity   Periphery  Attached, no heme Attached, mild mid zonal drusen, no heme              Refraction     Wearing Rx       Sphere  Cylinder Axis Add   Right +0.25 +0.50 023 +2.50   Left +2.00 +0.25 030 +2.50           IMAGING AND PROCEDURES  Imaging and Procedures for 07/28/2022  OCT, Retina - OU - Both Eyes       Right Eye Quality was good. Central Foveal Thickness: 455. Progression has worsened. Findings include normal foveal contour, no SRF, abnormal foveal contour, intraretinal fluid (Hx of BRVO, interval increase in IRF / edema inferior fovea and macula -- increased foveal involvement).   Left Eye Quality was good. Central Foveal Thickness: 294. Progression has been stable. Findings include normal foveal contour, no IRF, no SRF, retinal drusen .   Notes *Images captured and stored on drive  Diagnosis / Impression:  OD: Hx of BRVO-- interval increase in IRF inferior / edema fovea and macula -- increased foveal involvement OS: NFP, no IRF/SRF, +drusen  Clinical management:  See below  Abbreviations: NFP - Normal foveal profile. CME - cystoid macular edema. PED - pigment epithelial detachment. IRF - intraretinal fluid. SRF - subretinal fluid. EZ - ellipsoid zone. ERM - epiretinal membrane. ORA - outer retinal atrophy. ORT - outer retinal tubulation. SRHM - subretinal hyper-reflective material. IRHM - intraretinal hyper-reflective material      Intravitreal Injection, Pharmacologic Agent - OD - Right Eye       Time Out 07/28/2022. 1:58 PM. Confirmed correct patient, procedure, site, and patient consented.   Anesthesia Topical anesthesia was used. Anesthetic medications included Lidocaine 2%, Proparacaine 0.5%.   Procedure Preparation included 5% betadine to ocular surface, eyelid speculum. A supplied (32g) needle was used.   Injection: 1.25 mg Bevacizumab 1.25mg /0.23ml   Route: Intravitreal, Site: Right Eye   NDC: [id number], Lot: [id number], Expiration date: 09/18/2022    Post-op Post injection exam found visual acuity of at least counting fingers. The patient tolerated the procedure well. There were no complications. The patient received written and verbal post procedure care education. Post injection medications were not given.            ASSESSMENT/PLAN:    ICD-10-CM   1. Branch retinal vein occlusion of right eye with macular edema  H34.8310 OCT, Retina - OU - Both Eyes    Intravitreal Injection, Pharmacologic Agent - OD - Right Eye    Bevacizumab (AVASTIN) SOLN 1.25 mg    2. Essential hypertension  I10     3. Hypertensive retinopathy of both eyes  H35.033     4. Combined forms of age-related cataract of both eyes  H25.813       1. BRVO w/ CME OD  - delayed to follow up from 9 weeks to 13 weeks (11.01.23 - 01.31.24)             - s/p IVA OD #1 (03.30.22), #2 (05.23.22), #3 (7.25.22), #4 (10.05.22), #5 (12.14.22), #6 (03.15.23), #7 (05.31.23), #8 (08.16.23), #9 (11.01.23)  - hx of injections with Dr. Harless Litten in [place], [place] -- pt was receiving Lucentis q7-8 wks; pt reports history Avastin, but was switched to Lucentis at some point - last injection IVL OD was 02.04.22 (7.5 wks prior to initial visit here) - pt reports hx of worsening vision and OCT around 10 weeks - **worsening of IRF on OCT at 13 weeks on 03.15.23; 11 wks on 11.01.23** - BCVA OD is 20/40 (worse) - OCT today shows interval increase in IRF inferior / edema fovea and macula -- increased foveal involvement at 13 weeks - recommend IVA OD #10 today,  01.31.24 -- w/ f/u back to 6-8 wks - RBA of procedure discussed, questions answered - IVA informed consent obtained and signed, 08.16.23 (OD) - see procedure note  - F/U 6-8 weeks -- DFE/OCT/possible injection  2,3. Hypertensive retinopathy OU - discussed importance of tight BP control - continue to monitor  4. Mixed Cataract OU - The symptoms of cataract, surgical options, and treatments and risks were discussed with  patient. - discussed diagnosis and progression - not yet visually significant - monitor for now   Ophthalmic Meds Ordered this visit:  Meds ordered this encounter  Medications   Bevacizumab (AVASTIN) SOLN 1.25 mg     Return for f/u 6-8 weeks, BRVO OD, DFE, OCT.  There are no Patient Instructions on file for this visit.  This document serves as a record of services personally performed by Gardiner Sleeper, MD, PhD. It was created on their behalf by Orvan Falconer, an ophthalmic technician. The creation of this record is the provider's dictation and/or activities during the visit.    Electronically signed by: Orvan Falconer, OA, 07/28/22  2:34 PM  This document serves as a record of services personally performed by Gardiner Sleeper, MD, PhD. It was created on their behalf by San Jetty. Owens Shark, OA an ophthalmic technician. The creation of this record is the provider's dictation and/or activities during the visit.    Electronically signed by: San Jetty. Owens Shark, [place] 01.31.2024 2:34 PM  Gardiner Sleeper, M.D., Ph.D. Diseases & Surgery of the Retina and Vitreous [facility]  I have reviewed the above documentation for accuracy and completeness, and I agree with the above. Gardiner Sleeper, M.D., Ph.D. 07/28/22 2:36 PM   Abbreviations: M myopia (nearsighted); A astigmatism; H hyperopia (farsighted); P presbyopia; Mrx spectacle prescription;  CTL contact lenses; OD right eye; OS left eye; OU both eyes  XT exotropia; ET esotropia; PEK punctate epithelial keratitis; PEE punctate epithelial erosions; DES dry eye syndrome; MGD meibomian gland dysfunction; ATs artificial tears; PFAT's preservative free artificial tears; [place] nuclear sclerotic cataract; PSC posterior subcapsular cataract; ERM epi-retinal membrane; PVD posterior vitreous detachment; RD retinal detachment; DM diabetes mellitus; DR diabetic retinopathy; NPDR non-proliferative diabetic retinopathy; PDR proliferative  diabetic retinopathy; CSME clinically significant macular edema; DME diabetic macular edema; dbh dot blot hemorrhages; CWS cotton wool spot; POAG primary open angle glaucoma; C/D cup-to-disc ratio; HVF humphrey visual field; GVF goldmann visual field; OCT optical coherence tomography; IOP intraocular pressure; BRVO Branch retinal vein occlusion; CRVO central retinal vein occlusion; CRAO central retinal artery occlusion; BRAO branch retinal artery occlusion; RT retinal tear; SB scleral buckle; PPV pars plana vitrectomy; VH Vitreous hemorrhage; PRP panretinal laser photocoagulation; IVK intravitreal kenalog; VMT vitreomacular traction; MH Macular hole;  NVD neovascularization of the disc; NVE neovascularization elsewhere; AREDS age related eye disease study; ARMD age related macular degeneration; POAG primary open angle glaucoma; EBMD epithelial/anterior basement membrane dystrophy; ACIOL anterior chamber intraocular lens; IOL intraocular lens; PCIOL posterior chamber intraocular lens; Phaco/IOL phacoemulsification with intraocular lens placement; [place] photorefractive keratectomy; LASIK laser assisted in situ keratomileusis; HTN hypertension; DM diabetes mellitus; COPD chronic obstructive pulmonary disease

## 2022-07-28 ENCOUNTER — Encounter (INDEPENDENT_AMBULATORY_CARE_PROVIDER_SITE_OTHER): Payer: Self-pay | Admitting: Ophthalmology

## 2022-07-28 ENCOUNTER — Ambulatory Visit (INDEPENDENT_AMBULATORY_CARE_PROVIDER_SITE_OTHER): Payer: Medicare PPO | Admitting: Ophthalmology

## 2022-07-28 DIAGNOSIS — H34831 Tributary (branch) retinal vein occlusion, right eye, with macular edema: Secondary | ICD-10-CM

## 2022-07-28 DIAGNOSIS — I10 Essential (primary) hypertension: Secondary | ICD-10-CM | POA: Diagnosis not present

## 2022-07-28 DIAGNOSIS — H35033 Hypertensive retinopathy, bilateral: Secondary | ICD-10-CM

## 2022-07-28 DIAGNOSIS — H25813 Combined forms of age-related cataract, bilateral: Secondary | ICD-10-CM | POA: Diagnosis not present

## 2022-07-28 MED ORDER — BEVACIZUMAB CHEMO INJECTION 1.25MG/0.05ML SYRINGE FOR KALEIDOSCOPE
1.2500 mg | INTRAVITREAL | Status: AC | PRN
  Administered 2022-07-28: 1.25 mg via INTRAVITREAL

## 2022-08-26 ENCOUNTER — Encounter: Payer: Self-pay | Admitting: Radiology

## 2022-09-08 ENCOUNTER — Encounter (INDEPENDENT_AMBULATORY_CARE_PROVIDER_SITE_OTHER): Payer: Self-pay

## 2022-09-08 ENCOUNTER — Encounter (INDEPENDENT_AMBULATORY_CARE_PROVIDER_SITE_OTHER): Payer: Medicare PPO | Admitting: Ophthalmology

## 2022-09-08 DIAGNOSIS — H34831 Tributary (branch) retinal vein occlusion, right eye, with macular edema: Secondary | ICD-10-CM

## 2022-09-08 DIAGNOSIS — H35033 Hypertensive retinopathy, bilateral: Secondary | ICD-10-CM

## 2022-09-08 DIAGNOSIS — H25813 Combined forms of age-related cataract, bilateral: Secondary | ICD-10-CM

## 2022-09-08 DIAGNOSIS — I10 Essential (primary) hypertension: Secondary | ICD-10-CM

## 2022-09-15 NOTE — Progress Notes (Signed)
[facility] Note  09/22/2022    CHIEF COMPLAINT Patient presents for Retina Follow Up   HISTORY OF PRESENT ILLNESS: Emily Reid is a 77 y.o. female who presents to the clinic today for:  HPI     Retina Follow Up   Patient presents with  CRVO/BRVO.  In right eye.  This started years ago.  Duration of 13 weeks.  I, the attending physician,  performed the HPI with the patient and updated documentation appropriately.        Comments   Patient feels that the vision changes as she gets further from the last injection. She is using AT's OU PRN.       Last edited by Bernarda Caffey, MD on 09/22/2022 12:55 PM.       Referring physician: Melbourne Abts MD [place], [place] [id number]  HISTORICAL INFORMATION:   Selected notes from the MEDICAL RECORD NUMBER Referred by Dr. Joni Fears for continuation of eye injections OD for BRVO with edema LEE: 08/05/2020 Ocular Hx- BRVO, CME, iritis OD PMH- HTN   CURRENT MEDICATIONS: No current outpatient medications on file. (Ophthalmic Drugs)   No current facility-administered medications for this visit. (Ophthalmic Drugs)   Current Outpatient Medications (Other)  Medication Sig   amLODipine (NORVASC) 10 MG tablet Take 1 tablet (10 mg total) by mouth daily.   aspirin EC 325 MG tablet Take 1 tablet (325 mg total) by mouth daily. (Patient not taking: Reported on 11/13/2021)   buPROPion (WELLBUTRIN XL) 150 MG 24 hr tablet Take 150 mg by mouth daily.   Coenzyme Q10 (COQ10) 100 MG CAPS Take by mouth daily.   LOSARTAN POTASSIUM PO Take 1 tablet by mouth daily.   mirtazapine (REMERON) 15 MG tablet TAKE 1 TABLET AT BEDTIME   Multiple Vitamin (MULTIVITAMIN) tablet Take 1 tablet by mouth daily.   oxyCODONE (OXY IR/ROXICODONE) 5 MG immediate release tablet Take 1 tablet (5 mg total) by mouth every 4 (four) hours as needed (severe pain). (Patient not taking: Reported on 11/13/2021)   Phosphatidylserine 100  MG CAPS Take by mouth.   rosuvastatin (CRESTOR) 20 MG tablet Take 20 mg by mouth daily.   sertraline (ZOLOFT) 50 MG tablet TAKE 1 TABLET EVERY DAY   Turmeric (QC TUMERIC COMPLEX PO) Take 553 mg by mouth daily.   vitamin C (ASCORBIC ACID) 500 MG tablet Take 500 mg by mouth daily.   No current facility-administered medications for this visit. (Other)   REVIEW OF SYSTEMS: ROS   Positive for: Genitourinary, Eyes Negative for: Constitutional, Gastrointestinal, Neurological, Skin, Musculoskeletal, HENT, Endocrine, Cardiovascular, Respiratory, Psychiatric, Allergic/Imm, Heme/Lymph Last edited by Annie Paras, COT on 09/22/2022  9:32 AM.      ALLERGIES No Known Allergies  PAST MEDICAL HISTORY Past Medical History:  Diagnosis Date   Anxiety    Cataract    Mixed OU   Depression    Hyperlipidemia    Hypertension    Hypertensive retinopathy    OU   Left rotator cuff tear    Urine incontinence    Past Surgical History:  Procedure Laterality Date   FACIAL COSMETIC SURGERY     SHOULDER ARTHROSCOPY WITH SUBACROMIAL DECOMPRESSION, ROTATOR CUFF REPAIR AND BICEP TENDON REPAIR Left 11/17/2021   Procedure: LEFT SHOULDER ARTHROSCOPY WITH ROTATOR CUFF REPAIR AND BICEPS TENODESIS;  Surgeon: Vanetta Mulders, MD;  Location: [facility];  Service: Orthopedics;  Laterality: Left;   TONSILLECTOMY  1957   tummy  tuck     FAMILY HISTORY Family History  Problem Relation Age of Onset   Heart disease Mother    Hyperlipidemia Mother    Hypertension Mother    Stroke Mother    SOCIAL HISTORY Social History   Tobacco Use   Smoking status: Never   Smokeless tobacco: Never  Vaping Use   Vaping Use: Never used  Substance Use Topics   Alcohol use: Yes    Alcohol/week: 7.0 standard drinks of alcohol    Types: 7 Glasses of wine per week    Comment: social   Drug use: No       OPHTHALMIC EXAM:  Base Eye Exam     Visual Acuity (Snellen - Linear)       Right Left   Dist  cc 20/20 -1 20/20    Correction: Glasses         Tonometry (Tonopen, 9:35 AM)       Right Left   Pressure 13 14         Pupils       Dark Light Shape React APD   Right 3 2 Round Brisk None   Left 3 2 Round Brisk None         Visual Fields       Left Right    Full Full         Extraocular Movement       Right Left    Full, Ortho Full, Ortho         Neuro/Psych     Oriented x3: Yes   Mood/Affect: Normal         Dilation     Both eyes: 1.0% Mydriacyl, 2.5% Phenylephrine @ 9:33 AM           Slit Lamp and Fundus Exam     Slit Lamp Exam       Right Left   Lids/Lashes Dermatochalasis - upper lid Dermatochalasis - upper lid   Conjunctiva/Sclera White and quiet White and quiet   Cornea 1+ Punctate epithelial erosions, early band K nasal and temporal 1+ Punctate epithelial erosions, early band K nasal and temporal   Anterior Chamber Deep and quiet Deep and quiet   Iris Round and dilated Round and dilated   Lens 2+ Nuclear sclerosis, 1-2+ Cortical cataract, focal Posterior subcapsular cataract at 0700 2+ Nuclear sclerosis, 2+ Cortical cataract, focal Posterior subcapsular cataract at 0700   Anterior Vitreous Vitreous syneresis, silicone oil micro bubbles Vitreous syneresis, silicone oil micro bubbles, Posterior vitreous detachment         Fundus Exam       Right Left   Disc Pink and Sharp, mild PPA Pink and Sharp   C/D Ratio 0.2 0.2   Macula Flat, Blunted foveal reflex, Drusen, RPE mottling, No heme, interval improvement in cystic changes / edema inferior macula and fovea Flat, good foveal reflex, Drusen, RPE mottling and clumping, No heme or edema   Vessels mild attenuation, mild tortuosity attenuated, mild tortuosity   Periphery Attached, no heme Attached, mild mid zonal drusen, no heme              Refraction     Wearing Rx       Sphere Cylinder Axis Add   Right +0.25 +0.50 023 +2.50   Left +2.00 +0.25 030 +2.50            IMAGING AND PROCEDURES  Imaging and Procedures for 09/22/2022  OCT, Retina - OU - Both Eyes  Right Eye Quality was good. Central Foveal Thickness: 297. Progression has improved. Findings include normal foveal contour, no SRF, abnormal foveal contour, intraretinal fluid (Hx of BRVO, interval improvement in central IRF and foveal contour, persistent cystic changes temporal fovea and mac).   Left Eye Quality was good. Central Foveal Thickness: 296. Progression has been stable. Findings include normal foveal contour, no IRF, no SRF, retinal drusen .   Notes *Images captured and stored on drive  Diagnosis / Impression:  OD: Hx of BRVO --  interval improvement in central IRF and foveal contour, persistent cystic changes temporal fovea and mac OS: NFP, no IRF/SRF, +drusen  Clinical management:  See below  Abbreviations: NFP - Normal foveal profile. CME - cystoid macular edema. PED - pigment epithelial detachment. IRF - intraretinal fluid. SRF - subretinal fluid. EZ - ellipsoid zone. ERM - epiretinal membrane. ORA - outer retinal atrophy. ORT - outer retinal tubulation. SRHM - subretinal hyper-reflective material. IRHM - intraretinal hyper-reflective material      Intravitreal Injection, Pharmacologic Agent - OD - Right Eye       Time Out 09/22/2022. 10:34 AM. Confirmed correct patient, procedure, site, and patient consented.   Anesthesia Topical anesthesia was used. Anesthetic medications included Lidocaine 2%, Proparacaine 0.5%.   Procedure Preparation included 5% betadine to ocular surface, eyelid speculum. A (32g) needle was used.   Injection: 1.25 mg Bevacizumab 1.25mg /0.67ml   Route: Intravitreal, Site: Right Eye   NDC: H061816, Lot: [id number], Expiration date: 12/26/2022   Post-op Post injection exam found visual acuity of at least counting fingers. The patient tolerated the procedure well. There were no complications. The patient received written and verbal post  procedure care education. Post injection medications were not given.             ASSESSMENT/PLAN:    ICD-10-CM   1. Branch retinal vein occlusion of right eye with macular edema  H34.8310 OCT, Retina - OU - Both Eyes    Intravitreal Injection, Pharmacologic Agent - OD - Right Eye    Bevacizumab (AVASTIN) SOLN 1.25 mg    2. Essential hypertension  I10     3. Hypertensive retinopathy of both eyes  H35.033     4. Combined forms of age-related cataract of both eyes  H25.813      1. BRVO w/ CME OD  - delayed to follow up from 9 weeks to 13 weeks (11.01.23 - 01.31.24) -- worse edema             - s/p IVA OD #1 (03.30.22), #2 (05.23.22), #3 (7.25.22), #4 (10.05.22), #5 (12.14.22), #6 (03.15.23), #7 (05.31.23), #8 (08.16.23), #9 (11.01.23), #10 (01.31.24_  - hx of injections with Dr. Joni Fears in [place], [place] -- pt was receiving Lucentis q7-8 wks; pt reports history Avastin, but was switched to Lucentis at some point - last injection IVL OD was 02.04.22 (7.5 wks prior to initial visit here) - pt reports hx of worsening vision and OCT around 10 weeks - **worsening of IRF on OCT at 13 weeks on 01.31.24 and 03.15.23; 11 wks on 11.01.23** - BCVA OD back to 20/20 from 20/40 - OCT today shows interval improvement in central IRF and foveal contour, persistent cystic changes temporal fovea and mac at 8 weeks - recommend IVA OD #11 today, 03.27.24 -- w/ f/u in 8-9 wks - RBA of procedure discussed, questions answered - IVA informed consent obtained and signed, 08.16.23 (OD) - see procedure note  - F/U 8-9 weeks -- DFE/OCT/possible injection  2,3.  Hypertensive retinopathy OU - discussed importance of tight BP control - continue to monitor  4. Mixed Cataract OU - The symptoms of cataract, surgical options, and treatments and risks were discussed with patient. - discussed diagnosis and progression - monitor for now   Ophthalmic Meds Ordered this visit:  Meds ordered this encounter   Medications   Bevacizumab (AVASTIN) SOLN 1.25 mg     Return for f/u 8-9 weeks, BRVO OD, DFE, OCT.  There are no Patient Instructions on file for this visit.  This document serves as a record of services personally performed by Gardiner Sleeper, MD, PhD. It was created on their behalf by Orvan Falconer, an ophthalmic technician. The creation of this record is the provider's dictation and/or activities during the visit.    Electronically signed by: Orvan Falconer, OA, 09/23/22  1:08 PM  This document serves as a record of services personally performed by Gardiner Sleeper, MD, PhD. It was created on their behalf by San Jetty. Owens Shark, OA an ophthalmic technician. The creation of this record is the provider's dictation and/or activities during the visit.    Electronically signed by: San Jetty. Owens Shark, [place] 03.27.2024 1:08 PM  Gardiner Sleeper, M.D., Ph.D. Diseases & Surgery of the Retina and Vitreous [facility]  I have reviewed the above documentation for accuracy and completeness, and I agree with the above. Gardiner Sleeper, M.D., Ph.D. 09/23/22 1:10 PM   Abbreviations: M myopia (nearsighted); A astigmatism; H hyperopia (farsighted); P presbyopia; Mrx spectacle prescription;  CTL contact lenses; OD right eye; OS left eye; OU both eyes  XT exotropia; ET esotropia; PEK punctate epithelial keratitis; PEE punctate epithelial erosions; DES dry eye syndrome; MGD meibomian gland dysfunction; ATs artificial tears; PFAT's preservative free artificial tears; [place] nuclear sclerotic cataract; PSC posterior subcapsular cataract; ERM epi-retinal membrane; PVD posterior vitreous detachment; RD retinal detachment; DM diabetes mellitus; DR diabetic retinopathy; NPDR non-proliferative diabetic retinopathy; PDR proliferative diabetic retinopathy; CSME clinically significant macular edema; DME diabetic macular edema; dbh dot blot hemorrhages; CWS cotton wool spot; POAG primary open angle  glaucoma; C/D cup-to-disc ratio; HVF humphrey visual field; GVF goldmann visual field; OCT optical coherence tomography; IOP intraocular pressure; BRVO Branch retinal vein occlusion; CRVO central retinal vein occlusion; CRAO central retinal artery occlusion; BRAO branch retinal artery occlusion; RT retinal tear; SB scleral buckle; PPV pars plana vitrectomy; VH Vitreous hemorrhage; PRP panretinal laser photocoagulation; IVK intravitreal kenalog; VMT vitreomacular traction; MH Macular hole;  NVD neovascularization of the disc; NVE neovascularization elsewhere; AREDS age related eye disease study; ARMD age related macular degeneration; POAG primary open angle glaucoma; EBMD epithelial/anterior basement membrane dystrophy; ACIOL anterior chamber intraocular lens; IOL intraocular lens; PCIOL posterior chamber intraocular lens; Phaco/IOL phacoemulsification with intraocular lens placement; [place] photorefractive keratectomy; LASIK laser assisted in situ keratomileusis; HTN hypertension; DM diabetes mellitus; COPD chronic obstructive pulmonary disease

## 2022-09-22 ENCOUNTER — Ambulatory Visit (INDEPENDENT_AMBULATORY_CARE_PROVIDER_SITE_OTHER): Payer: Medicare PPO | Admitting: Ophthalmology

## 2022-09-22 ENCOUNTER — Encounter (INDEPENDENT_AMBULATORY_CARE_PROVIDER_SITE_OTHER): Payer: Self-pay | Admitting: Ophthalmology

## 2022-09-22 DIAGNOSIS — H34831 Tributary (branch) retinal vein occlusion, right eye, with macular edema: Secondary | ICD-10-CM | POA: Diagnosis not present

## 2022-09-22 DIAGNOSIS — H35033 Hypertensive retinopathy, bilateral: Secondary | ICD-10-CM | POA: Diagnosis not present

## 2022-09-22 DIAGNOSIS — I10 Essential (primary) hypertension: Secondary | ICD-10-CM

## 2022-09-22 DIAGNOSIS — H25813 Combined forms of age-related cataract, bilateral: Secondary | ICD-10-CM | POA: Diagnosis not present

## 2022-09-22 MED ORDER — BEVACIZUMAB CHEMO INJECTION 1.25MG/0.05ML SYRINGE FOR KALEIDOSCOPE
1.2500 mg | INTRAVITREAL | Status: AC | PRN
  Administered 2022-09-22: 1.25 mg via INTRAVITREAL

## 2022-11-11 DIAGNOSIS — F419 Anxiety disorder, unspecified: Secondary | ICD-10-CM | POA: Diagnosis not present

## 2022-11-11 DIAGNOSIS — R7989 Other specified abnormal findings of blood chemistry: Secondary | ICD-10-CM | POA: Diagnosis not present

## 2022-11-11 DIAGNOSIS — D72819 Decreased white blood cell count, unspecified: Secondary | ICD-10-CM | POA: Diagnosis not present

## 2022-11-11 DIAGNOSIS — E785 Hyperlipidemia, unspecified: Secondary | ICD-10-CM | POA: Diagnosis not present

## 2022-11-18 NOTE — Progress Notes (Signed)
[facility] - Clinic Note  11/24/2022    CHIEF COMPLAINT Patient presents for Retina Follow Up   HISTORY OF PRESENT ILLNESS: Emily Reid is a 77 y.o. female who presents to the clinic today for:  HPI     Retina Follow Up   Patient presents with  CRVO/BRVO.  In right eye.  This started years ago.  Duration of 9 weeks.  Since onset it is stable.  I, the attending physician,  performed the HPI with the patient and updated documentation appropriately.        Comments   Patient denies noticing any vision changes at this time. She is Systane OU PRN.       Last edited by Rennis Chris, MD on 11/24/2022  4:58 PM.      Referring physician: Lois Huxley MD [address] [place], [place] [address]  HISTORICAL INFORMATION:   Selected notes from the MEDICAL RECORD NUMBER Referred by Dr. Harless Litten for continuation of eye injections OD for BRVO with edema LEE: 08/05/2020 Ocular Hx- BRVO, CME, iritis OD PMH- HTN   CURRENT MEDICATIONS: No current outpatient medications on file. (Ophthalmic Drugs)   No current facility-administered medications for this visit. (Ophthalmic Drugs)   Current Outpatient Medications (Other)  Medication Sig   amLODipine (NORVASC) 10 MG tablet Take 1 tablet (10 mg total) by mouth daily.   buPROPion (WELLBUTRIN XL) 150 MG 24 hr tablet Take 150 mg by mouth daily.   Coenzyme Q10 (COQ10) 100 MG CAPS Take by mouth daily.   LOSARTAN POTASSIUM PO Take 1 tablet by mouth daily.   mirtazapine (REMERON) 15 MG tablet TAKE 1 TABLET AT BEDTIME   Phosphatidylserine 100 MG CAPS Take by mouth.   rosuvastatin (CRESTOR) 20 MG tablet Take 20 mg by mouth daily.   sertraline (ZOLOFT) 50 MG tablet TAKE 1 TABLET EVERY DAY   Turmeric (QC TUMERIC COMPLEX PO) Take 553 mg by mouth daily.   vitamin C (ASCORBIC ACID) 500 MG tablet Take 500 mg by mouth daily.   aspirin EC 325 MG tablet Take 1 tablet (325 mg total) by mouth daily. (Patient not taking:  Reported on 11/13/2021)   Multiple Vitamin (MULTIVITAMIN) tablet Take 1 tablet by mouth daily.   oxyCODONE (OXY IR/ROXICODONE) 5 MG immediate release tablet Take 1 tablet (5 mg total) by mouth every 4 (four) hours as needed (severe pain). (Patient not taking: Reported on 11/13/2021)   No current facility-administered medications for this visit. (Other)   REVIEW OF SYSTEMS: ROS   Positive for: Genitourinary, Eyes Negative for: Constitutional, Gastrointestinal, Neurological, Skin, Musculoskeletal, HENT, Endocrine, Cardiovascular, Respiratory, Psychiatric, Allergic/Imm, Heme/Lymph Last edited by Julieanne Cotton, COT on 11/24/2022  1:28 PM.       ALLERGIES No Known Allergies  PAST MEDICAL HISTORY Past Medical History:  Diagnosis Date   Anxiety    Cataract    Mixed OU   Depression    Hyperlipidemia    Hypertension    Hypertensive retinopathy    OU   Left rotator cuff tear    Urine incontinence    Past Surgical History:  Procedure Laterality Date   FACIAL COSMETIC SURGERY     SHOULDER ARTHROSCOPY WITH SUBACROMIAL DECOMPRESSION, ROTATOR CUFF REPAIR AND BICEP TENDON REPAIR Left 11/17/2021   Procedure: LEFT SHOULDER ARTHROSCOPY WITH ROTATOR CUFF REPAIR AND BICEPS TENODESIS;  Surgeon: Huel Cote, MD;  Location: [facility];  Service: Orthopedics;  Laterality: Left;   TONSILLECTOMY  1957  tummy tuck     FAMILY HISTORY Family History  Problem Relation Age of Onset   Heart disease Mother    Hyperlipidemia Mother    Hypertension Mother    Stroke Mother    SOCIAL HISTORY Social History   Tobacco Use   Smoking status: Never   Smokeless tobacco: Never  Vaping Use   Vaping Use: Never used  Substance Use Topics   Alcohol use: Yes    Alcohol/week: 7.0 standard drinks of alcohol    Types: 7 Glasses of wine per week    Comment: social   Drug use: No       OPHTHALMIC EXAM:  Base Eye Exam     Visual Acuity (Snellen - Linear)       Right Left    Dist cc 20/20 +2 20/20    Correction: Glasses         Tonometry (Tonopen, 1:31 PM)       Right Left   Pressure 19 17         Pupils       Dark Light Shape React APD   Right 3 2 Round Brisk None   Left 3 2 Round Brisk None         Visual Fields       Left Right    Full Full         Extraocular Movement       Right Left    Full, Ortho Full, Ortho         Neuro/Psych     Oriented x3: Yes   Mood/Affect: Normal         Dilation     Both eyes: 1.0% Mydriacyl, 2.5% Phenylephrine @ 1:28 PM           Slit Lamp and Fundus Exam     Slit Lamp Exam       Right Left   Lids/Lashes Dermatochalasis - upper lid Dermatochalasis - upper lid   Conjunctiva/Sclera White and quiet White and quiet   Cornea 1+ Punctate epithelial erosions, early band K nasal and temporal 1+ Punctate epithelial erosions, early band K nasal and temporal   Anterior Chamber Deep and quiet Deep and quiet   Iris Round and dilated Round and dilated   Lens 2+ Nuclear sclerosis, 1-2+ Cortical cataract, focal Posterior subcapsular cataract at 0700 2+ Nuclear sclerosis, 2+ Cortical cataract, focal Posterior subcapsular cataract at 0700   Anterior Vitreous Vitreous syneresis, silicone oil micro bubbles Vitreous syneresis, silicone oil micro bubbles, Posterior vitreous detachment         Fundus Exam       Right Left   Disc Pink and Sharp, mild PPA Pink and Sharp   C/D Ratio 0.2 0.2   Macula Flat, Blunted foveal reflex, Drusen, RPE mottling, No heme, interval increase in cystic changes / edema inferior macula and fovea Flat, good foveal reflex, Drusen, RPE mottling and clumping, No heme or edema   Vessels mild attenuation, mild tortuosity attenuated, mild tortuosity   Periphery Attached, no heme Attached, mild mid zonal drusen, no heme              Refraction     Wearing Rx       Sphere Cylinder Axis Add   Right +0.25 +0.50 023 +2.50   Left +2.00 +0.25 030 +2.50            IMAGING AND PROCEDURES  Imaging and Procedures for 11/24/2022  OCT, Retina - OU - Both  Eyes       Right Eye Quality was good. Central Foveal Thickness: 315. Progression has worsened. Findings include normal foveal contour, no SRF, abnormal foveal contour, intraretinal fluid (Hx of BRVO, interval increase in central IRF, persistent cystic changes temporal fovea and mac).   Left Eye Quality was good. Central Foveal Thickness: 303. Progression has been stable. Findings include normal foveal contour, no IRF, no SRF, retinal drusen .   Notes *Images captured and stored on drive  Diagnosis / Impression:  OD: Hx of BRVO -- interval increase in central IRF, persistent cystic changes temporal fovea and mac OS: NFP, no IRF/SRF, +drusen  Clinical management:  See below  Abbreviations: NFP - Normal foveal profile. CME - cystoid macular edema. PED - pigment epithelial detachment. IRF - intraretinal fluid. SRF - subretinal fluid. EZ - ellipsoid zone. ERM - epiretinal membrane. ORA - outer retinal atrophy. ORT - outer retinal tubulation. SRHM - subretinal hyper-reflective material. IRHM - intraretinal hyper-reflective material      Intravitreal Injection, Pharmacologic Agent - OD - Right Eye       Time Out 11/24/2022. 2:24 PM. Confirmed correct patient, procedure, site, and patient consented.   Anesthesia Topical anesthesia was used. Anesthetic medications included Lidocaine 2%, Proparacaine 0.5%.   Procedure Preparation included 5% betadine to ocular surface, eyelid speculum. A (32g) needle was used.   Injection: 1.25 mg Bevacizumab 1.25mg /0.61ml   Route: Intravitreal, Site: Right Eye   NDC: [id number], Lot: [id number], Expiration date: 02/26/2023   Post-op Post injection exam found visual acuity of at least counting fingers. The patient tolerated the procedure well. There were no complications. The patient received written and verbal post procedure care education. Post injection  medications were not given.            ASSESSMENT/PLAN:    ICD-10-CM   1. Branch retinal vein occlusion of right eye with macular edema  H34.8310 OCT, Retina - OU - Both Eyes    Intravitreal Injection, Pharmacologic Agent - OD - Right Eye    Bevacizumab (AVASTIN) SOLN 1.25 mg    2. Essential hypertension  I10     3. Hypertensive retinopathy of both eyes  H35.033     4. Combined forms of age-related cataract of both eyes  H25.813      1. BRVO w/ CME OD - delayed to follow up from 9 weeks to 13 weeks (11.01.23 - 01.31.24) -- worse edema  - s/p IVA OD #1 (03.30.22), #2 (05.23.22), #3 (7.25.22), #4 (10.05.22), #5 (12.14.22), #6 (03.15.23), #7 (05.31.23), #8 (08.16.23), #9 (11.01.23), #10 (01.31.24), #11 (03.27.24) - hx of injections with Dr. Harless Litten in [place], [place] -- pt was receiving Lucentis q7-8 wks; pt reports history Avastin, but was switched to Lucentis at some point - last injection IVL OD was 02.04.22 (7.5 wks prior to initial visit here) - pt reports hx of worsening vision and OCT around 10 weeks - **worsening of IRF on OCT at 13 weeks on 01.31.24 and 03.15.23; 11 wks on 11.01.23** - BCVA OD back to 20/20 from 20/40 - OCT today shows Hx of BRVO, interval increase in central IRF, persistent cystic changes temporal fovea and mac at 9 weeks - recommend IVA OD #12 today, 05.29.24 -- w/ f/u in 7-8 wks - RBA of procedure discussed, questions answered - IVA informed consent obtained and signed, 08.16.23 (OD) - see procedure note  - F/U 7-8 weeks -- DFE/OCT/possible injection  2,3. Hypertensive retinopathy OU - discussed importance of tight BP control - continue  to monitor  4. Mixed Cataract OU - The symptoms of cataract, surgical options, and treatments and risks were discussed with patient. - discussed diagnosis and progression - monitor for now   Ophthalmic Meds Ordered this visit:  Meds ordered this encounter  Medications   Bevacizumab (AVASTIN) SOLN 1.25 mg      Return in about 8 weeks (around 01/19/2023) for f/u BVRO OD , DFE, OCT, Possible, IVA, OD.  There are no Patient Instructions on file for this visit.  This document serves as a record of services personally performed by Karie Chimera, MD, PhD. It was created on their behalf by De Blanch, an ophthalmic technician. The creation of this record is the provider's dictation and/or activities during the visit.    Electronically signed by: De Blanch, OA, 11/26/22  1:59 AM  This document serves as a record of services personally performed by Karie Chimera, MD, PhD. It was created on their behalf by Gerilyn Nestle, COT an ophthalmic technician. The creation of this record is the provider's dictation and/or activities during the visit.    Electronically signed by:  Gerilyn Nestle, COT  5.29.24 1:59 AM   Karie Chimera, M.D., Ph.D. Diseases & Surgery of the Retina and Vitreous [facility]  I have reviewed the above documentation for accuracy and completeness, and I agree with the above. Karie Chimera, M.D., Ph.D. 11/26/22 2:01 AM   Abbreviations: M myopia (nearsighted); A astigmatism; H hyperopia (farsighted); P presbyopia; Mrx spectacle prescription;  CTL contact lenses; OD right eye; OS left eye; OU both eyes  XT exotropia; ET esotropia; PEK punctate epithelial keratitis; PEE punctate epithelial erosions; DES dry eye syndrome; MGD meibomian gland dysfunction; ATs artificial tears; PFAT's preservative free artificial tears; NSC nuclear sclerotic cataract; PSC posterior subcapsular cataract; ERM epi-retinal membrane; PVD posterior vitreous detachment; RD retinal detachment; DM diabetes mellitus; DR diabetic retinopathy; NPDR non-proliferative diabetic retinopathy; PDR proliferative diabetic retinopathy; CSME clinically significant macular edema; DME diabetic macular edema; dbh dot blot hemorrhages; CWS cotton wool spot; POAG primary open angle glaucoma;  C/D cup-to-disc ratio; HVF humphrey visual field; GVF goldmann visual field; OCT optical coherence tomography; IOP intraocular pressure; BRVO Branch retinal vein occlusion; CRVO central retinal vein occlusion; CRAO central retinal artery occlusion; BRAO branch retinal artery occlusion; RT retinal tear; SB scleral buckle; PPV pars plana vitrectomy; VH Vitreous hemorrhage; PRP panretinal laser photocoagulation; IVK intravitreal kenalog; VMT vitreomacular traction; MH Macular hole;  NVD neovascularization of the disc; NVE neovascularization elsewhere; AREDS age related eye disease study; ARMD age related macular degeneration; POAG primary open angle glaucoma; EBMD epithelial/anterior basement membrane dystrophy; ACIOL anterior chamber intraocular lens; IOL intraocular lens; PCIOL posterior chamber intraocular lens; Phaco/IOL phacoemulsification with intraocular lens placement; PRK photorefractive keratectomy; LASIK laser assisted in situ keratomileusis; HTN hypertension; DM diabetes mellitus; COPD chronic obstructive pulmonary disease

## 2022-11-24 ENCOUNTER — Ambulatory Visit (INDEPENDENT_AMBULATORY_CARE_PROVIDER_SITE_OTHER): Payer: Medicare PPO | Admitting: Ophthalmology

## 2022-11-24 ENCOUNTER — Encounter (INDEPENDENT_AMBULATORY_CARE_PROVIDER_SITE_OTHER): Payer: Self-pay | Admitting: Ophthalmology

## 2022-11-24 DIAGNOSIS — H35033 Hypertensive retinopathy, bilateral: Secondary | ICD-10-CM

## 2022-11-24 DIAGNOSIS — H25813 Combined forms of age-related cataract, bilateral: Secondary | ICD-10-CM | POA: Diagnosis not present

## 2022-11-24 DIAGNOSIS — H34831 Tributary (branch) retinal vein occlusion, right eye, with macular edema: Secondary | ICD-10-CM

## 2022-11-24 DIAGNOSIS — I10 Essential (primary) hypertension: Secondary | ICD-10-CM

## 2022-11-24 MED ORDER — BEVACIZUMAB CHEMO INJECTION 1.25MG/0.05ML SYRINGE FOR KALEIDOSCOPE
1.2500 mg | INTRAVITREAL | Status: AC | PRN
  Administered 2022-11-24: 1.25 mg via INTRAVITREAL

## 2022-11-26 DIAGNOSIS — I10 Essential (primary) hypertension: Secondary | ICD-10-CM | POA: Diagnosis not present

## 2022-11-26 DIAGNOSIS — Z1331 Encounter for screening for depression: Secondary | ICD-10-CM | POA: Diagnosis not present

## 2022-11-26 DIAGNOSIS — Z1339 Encounter for screening examination for other mental health and behavioral disorders: Secondary | ICD-10-CM | POA: Diagnosis not present

## 2022-11-26 DIAGNOSIS — N1832 Chronic kidney disease, stage 3b: Secondary | ICD-10-CM | POA: Diagnosis not present

## 2022-11-26 DIAGNOSIS — E785 Hyperlipidemia, unspecified: Secondary | ICD-10-CM | POA: Diagnosis not present

## 2022-11-26 DIAGNOSIS — I129 Hypertensive chronic kidney disease with stage 1 through stage 4 chronic kidney disease, or unspecified chronic kidney disease: Secondary | ICD-10-CM | POA: Diagnosis not present

## 2022-11-26 DIAGNOSIS — Z800 Family history of malignant neoplasm of digestive organs: Secondary | ICD-10-CM | POA: Diagnosis not present

## 2022-11-26 DIAGNOSIS — Z0000 Encounter for general adult medical examination without abnormal findings: Secondary | ICD-10-CM | POA: Diagnosis not present

## 2022-11-26 DIAGNOSIS — D72819 Decreased white blood cell count, unspecified: Secondary | ICD-10-CM | POA: Diagnosis not present

## 2022-11-30 DIAGNOSIS — L821 Other seborrheic keratosis: Secondary | ICD-10-CM | POA: Diagnosis not present

## 2022-11-30 DIAGNOSIS — L820 Inflamed seborrheic keratosis: Secondary | ICD-10-CM | POA: Diagnosis not present

## 2022-11-30 DIAGNOSIS — B079 Viral wart, unspecified: Secondary | ICD-10-CM | POA: Diagnosis not present

## 2022-11-30 DIAGNOSIS — D485 Neoplasm of uncertain behavior of skin: Secondary | ICD-10-CM | POA: Diagnosis not present

## 2022-12-17 ENCOUNTER — Other Ambulatory Visit: Payer: Self-pay | Admitting: Internal Medicine

## 2022-12-17 DIAGNOSIS — Z1231 Encounter for screening mammogram for malignant neoplasm of breast: Secondary | ICD-10-CM

## 2022-12-27 DIAGNOSIS — L603 Nail dystrophy: Secondary | ICD-10-CM | POA: Diagnosis not present

## 2022-12-27 DIAGNOSIS — L814 Other melanin hyperpigmentation: Secondary | ICD-10-CM | POA: Diagnosis not present

## 2022-12-27 DIAGNOSIS — L821 Other seborrheic keratosis: Secondary | ICD-10-CM | POA: Diagnosis not present

## 2022-12-27 DIAGNOSIS — D2271 Melanocytic nevi of right lower limb, including hip: Secondary | ICD-10-CM | POA: Diagnosis not present

## 2023-01-07 NOTE — Progress Notes (Signed)
[facility] - Clinic Note  01/20/2023    CHIEF COMPLAINT Patient presents for Retina Follow Up   HISTORY OF PRESENT ILLNESS: Emily Reid is a 77 y.o. female who presents to the clinic today for:  HPI     Retina Follow Up   Patient presents with  CRVO/BRVO.  In right eye.  This started months ago.  Duration of 8 weeks.  Since onset it is stable.  I, the attending physician,  performed the HPI with the patient and updated documentation appropriately.        Comments   Patient can tell that it's time for her shot, the vision is getting blurry. She is using Systane OU PRN.       Last edited by Rennis Chris, MD on 01/20/2023  9:04 PM.     Referring physician: Lois Huxley MD [address] [place], [place] [address]  HISTORICAL INFORMATION:   Selected notes from the MEDICAL RECORD NUMBER Referred by Dr. Harless Litten for continuation of eye injections OD for BRVO with edema LEE: 08/05/2020 Ocular Hx- BRVO, CME, iritis OD PMH- HTN   CURRENT MEDICATIONS: No current outpatient medications on file. (Ophthalmic Drugs)   No current facility-administered medications for this visit. (Ophthalmic Drugs)   Current Outpatient Medications (Other)  Medication Sig   amLODipine (NORVASC) 10 MG tablet Take 1 tablet (10 mg total) by mouth daily.   aspirin EC 325 MG tablet Take 1 tablet (325 mg total) by mouth daily. (Patient not taking: Reported on 11/13/2021)   buPROPion (WELLBUTRIN XL) 150 MG 24 hr tablet Take 150 mg by mouth daily.   Coenzyme Q10 (COQ10) 100 MG CAPS Take by mouth daily.   LOSARTAN POTASSIUM PO Take 1 tablet by mouth daily.   mirtazapine (REMERON) 15 MG tablet TAKE 1 TABLET AT BEDTIME   Multiple Vitamin (MULTIVITAMIN) tablet Take 1 tablet by mouth daily.   oxyCODONE (OXY IR/ROXICODONE) 5 MG immediate release tablet Take 1 tablet (5 mg total) by mouth every 4 (four) hours as needed (severe pain). (Patient not taking: Reported on 11/13/2021)    Phosphatidylserine 100 MG CAPS Take by mouth.   rosuvastatin (CRESTOR) 20 MG tablet Take 20 mg by mouth daily.   sertraline (ZOLOFT) 50 MG tablet TAKE 1 TABLET EVERY DAY   Turmeric (QC TUMERIC COMPLEX PO) Take 553 mg by mouth daily.   vitamin C (ASCORBIC ACID) 500 MG tablet Take 500 mg by mouth daily.   No current facility-administered medications for this visit. (Other)   REVIEW OF SYSTEMS: ROS   Positive for: Genitourinary, Eyes Negative for: Constitutional, Gastrointestinal, Neurological, Skin, Musculoskeletal, HENT, Endocrine, Cardiovascular, Respiratory, Psychiatric, Allergic/Imm, Heme/Lymph Last edited by Charlette Caffey, COT on 01/20/2023  9:06 AM.     ALLERGIES No Known Allergies  PAST MEDICAL HISTORY Past Medical History:  Diagnosis Date   Anxiety    Cataract    Mixed OU   Depression    Hyperlipidemia    Hypertension    Hypertensive retinopathy    OU   Left rotator cuff tear    Urine incontinence    Past Surgical History:  Procedure Laterality Date   FACIAL COSMETIC SURGERY     SHOULDER ARTHROSCOPY WITH SUBACROMIAL DECOMPRESSION, ROTATOR CUFF REPAIR AND BICEP TENDON REPAIR Left 11/17/2021   Procedure: LEFT SHOULDER ARTHROSCOPY WITH ROTATOR CUFF REPAIR AND BICEPS TENODESIS;  Surgeon: Huel Cote, MD;  Location: [facility];  Service: Orthopedics;  Laterality: Left;   TONSILLECTOMY  1957   tummy tuck     FAMILY HISTORY Family History  Problem Relation Age of Onset   Heart disease Mother    Hyperlipidemia Mother    Hypertension Mother    Stroke Mother    SOCIAL HISTORY Social History   Tobacco Use   Smoking status: Never   Smokeless tobacco: Never  Vaping Use   Vaping status: Never Used  Substance Use Topics   Alcohol use: Yes    Alcohol/week: 7.0 standard drinks of alcohol    Types: 7 Glasses of wine per week    Comment: social   Drug use: No       OPHTHALMIC EXAM:  Base Eye Exam     Visual Acuity (Snellen - Linear)        Right Left   Dist cc 20/25 20/20   Dist ph cc NI     Correction: Glasses         Tonometry (Tonopen, 9:09 AM)       Right Left   Pressure 18 19         Pupils       Dark Light Shape React APD   Right 3 2 Round Brisk None   Left 3 2 Round Brisk None         Visual Fields       Left Right    Full Full         Extraocular Movement       Right Left    Full, Ortho Full, Ortho         Neuro/Psych     Oriented x3: Yes   Mood/Affect: Normal         Dilation     Both eyes: 1.0% Mydriacyl, 2.5% Phenylephrine @ 9:07 AM           Slit Lamp and Fundus Exam     Slit Lamp Exam       Right Left   Lids/Lashes Dermatochalasis - upper lid Dermatochalasis - upper lid   Conjunctiva/Sclera White and quiet White and quiet   Cornea 1+ Punctate epithelial erosions, early band K nasal and temporal 1+ Punctate epithelial erosions, early band K nasal and temporal   Anterior Chamber Deep and quiet Deep and quiet   Iris Round and dilated Round and dilated   Lens 2+ Nuclear sclerosis, 1-2+ Cortical cataract, focal Posterior subcapsular cataract at 0700 2+ Nuclear sclerosis, 2+ Cortical cataract, focal Posterior subcapsular cataract at 0700   Anterior Vitreous Vitreous syneresis, silicone oil micro bubbles Vitreous syneresis, silicone oil micro bubbles, Posterior vitreous detachment         Fundus Exam       Right Left   Disc Pink and Sharp, mild PPA Pink and Sharp   C/D Ratio 0.2 0.2   Macula Flat, Blunted foveal reflex, Drusen, RPE mottling, No heme, interval increase in cystic changes / edema inferior macula and fovea Flat, good foveal reflex, Drusen, RPE mottling and clumping, No heme or edema   Vessels attenuated, mild tortuosity attenuated, mild tortuosity   Periphery Attached, no heme Attached, mild mid zonal drusen, no heme              Refraction     Wearing Rx       Sphere Cylinder Axis Add   Right +0.25 +0.50 023 +2.50   Left +2.00 +0.25 030  +2.50           IMAGING AND PROCEDURES  Imaging and Procedures for  01/20/2023  OCT, Retina - OU - Both Eyes       Right Eye Quality was good. Central Foveal Thickness: 434. Progression has worsened. Findings include normal foveal contour, no SRF, abnormal foveal contour, intraretinal fluid (Hx of BRVO, interval increase in central IRF, cystic changes temporal fovea and mac).   Left Eye Quality was good. Central Foveal Thickness: 305. Progression has been stable. Findings include normal foveal contour, no IRF, no SRF, retinal drusen .   Notes *Images captured and stored on drive  Diagnosis / Impression:  OD: Hx of BRVO -- interval increase in central IRF, cystic changes temporal fovea and mac OS: NFP, no IRF/SRF, +drusen  Clinical management:  See below  Abbreviations: NFP - Normal foveal profile. CME - cystoid macular edema. PED - pigment epithelial detachment. IRF - intraretinal fluid. SRF - subretinal fluid. EZ - ellipsoid zone. ERM - epiretinal membrane. ORA - outer retinal atrophy. ORT - outer retinal tubulation. SRHM - subretinal hyper-reflective material. IRHM - intraretinal hyper-reflective material      Intravitreal Injection, Pharmacologic Agent - OD - Right Eye       Time Out 01/20/2023. 9:49 AM. Confirmed correct patient, procedure, site, and patient consented.   Anesthesia Topical anesthesia was used. Anesthetic medications included Lidocaine 2%, Proparacaine 0.5%.   Procedure Preparation included 5% betadine to ocular surface, eyelid speculum. A (32g) needle was used.   Injection: 1.25 mg Bevacizumab 1.25mg /0.16ml   Route: Intravitreal, Site: Right Eye   NDC: [id number], Lot: [id number], Expiration date: 04/04/2023   Post-op Post injection exam found visual acuity of at least counting fingers. The patient tolerated the procedure well. There were no complications. The patient received written and verbal post procedure care education. Post injection  medications were not given.            ASSESSMENT/PLAN:    ICD-10-CM   1. Branch retinal vein occlusion of right eye with macular edema  H34.8310 OCT, Retina - OU - Both Eyes    Intravitreal Injection, Pharmacologic Agent - OD - Right Eye    Bevacizumab (AVASTIN) SOLN 1.25 mg    2. Essential hypertension  I10     3. Hypertensive retinopathy of both eyes  H35.033     4. Combined forms of age-related cataract of both eyes  H25.813      1. BRVO w/ CME OD - delayed to follow up from 9 weeks to 13 weeks (11.01.23 - 01.31.24) -- worse edema - s/p IVA OD #1 (03.30.22), #2 (05.23.22), #3 (7.25.22), #4 (10.05.22), #5 (12.14.22), #6 (03.15.23), #7 (05.31.23), #8 (08.16.23), #9 (11.01.23), #10 (01.31.24), #11 (03.27.24), #12 (05.29.24) - hx of injections with Dr. Harless Litten in [place], [place] -- pt was receiving Lucentis q7-8 wks; pt reports history Avastin, but was switched to Lucentis at some point - last injection IVL OD was 02.04.22 (7.5 wks prior to initial visit here) - pt reports hx of worsening vision and OCT around 10 weeks - **worsening of IRF on OCT at 13 weeks on 01.31.24 and 03.15.23; 11 wks on 11.01.23; 8 wks on 07.25.24** - BCVA OD 20/25 from 20/20 - OCT today shows Hx interval increase in central IRF, cystic changes temporal fovea and mac at 8 weeks **discussed decreased efficacy / resistance to Avastin and potential benefit of switching medication** - recommend IVA OD #13 today, 07.25.24 -- w/ f/u back to 7 wks - RBA of procedure discussed, questions answered - IVA informed consent obtained and signed, 08.16.23 (OD) - see procedure note  -  will check auth for Eylea for next visit - F/U 7 weeks -- DFE/OCT/possible injection  2,3. Hypertensive retinopathy OU - discussed importance of tight BP control - continue to monitor  4. Mixed Cataract OU - The symptoms of cataract, surgical options, and treatments and risks were discussed with patient. - discussed diagnosis and  progression - monitor for now   Ophthalmic Meds Ordered this visit:  Meds ordered this encounter  Medications   Bevacizumab (AVASTIN) SOLN 1.25 mg     Return in about 7 weeks (around 03/10/2023).  There are no Patient Instructions on file for this visit.  This document serves as a record of services personally performed by Karie Chimera, MD, PhD. It was created on their behalf by Glee Arvin. Manson Passey, OA an ophthalmic technician. The creation of this record is the provider's dictation and/or activities during the visit.    Electronically signed by: Glee Arvin. Manson Passey, OA 01/20/23 9:31 PM  Karie Chimera, M.D., Ph.D. Diseases & Surgery of the Retina and Vitreous [facility]  I have reviewed the above documentation for accuracy and completeness, and I agree with the above. Karie Chimera, M.D., Ph.D. 01/20/23 9:33 PM  Abbreviations: M myopia (nearsighted); A astigmatism; H hyperopia (farsighted); P presbyopia; Mrx spectacle prescription;  CTL contact lenses; OD right eye; OS left eye; OU both eyes  XT exotropia; ET esotropia; PEK punctate epithelial keratitis; PEE punctate epithelial erosions; DES dry eye syndrome; MGD meibomian gland dysfunction; ATs artificial tears; PFAT's preservative free artificial tears; NSC nuclear sclerotic cataract; PSC posterior subcapsular cataract; ERM epi-retinal membrane; PVD posterior vitreous detachment; RD retinal detachment; DM diabetes mellitus; DR diabetic retinopathy; NPDR non-proliferative diabetic retinopathy; PDR proliferative diabetic retinopathy; CSME clinically significant macular edema; DME diabetic macular edema; dbh dot blot hemorrhages; CWS cotton wool spot; POAG primary open angle glaucoma; C/D cup-to-disc ratio; HVF humphrey visual field; GVF goldmann visual field; OCT optical coherence tomography; IOP intraocular pressure; BRVO Branch retinal vein occlusion; CRVO central retinal vein occlusion; CRAO central retinal artery  occlusion; BRAO branch retinal artery occlusion; RT retinal tear; SB scleral buckle; PPV pars plana vitrectomy; VH Vitreous hemorrhage; PRP panretinal laser photocoagulation; IVK intravitreal kenalog; VMT vitreomacular traction; MH Macular hole;  NVD neovascularization of the disc; NVE neovascularization elsewhere; AREDS age related eye disease study; ARMD age related macular degeneration; POAG primary open angle glaucoma; EBMD epithelial/anterior basement membrane dystrophy; ACIOL anterior chamber intraocular lens; IOL intraocular lens; PCIOL posterior chamber intraocular lens; Phaco/IOL phacoemulsification with intraocular lens placement; PRK photorefractive keratectomy; LASIK laser assisted in situ keratomileusis; HTN hypertension; DM diabetes mellitus; COPD chronic obstructive pulmonary disease

## 2023-01-20 ENCOUNTER — Encounter (INDEPENDENT_AMBULATORY_CARE_PROVIDER_SITE_OTHER): Payer: Self-pay | Admitting: Ophthalmology

## 2023-01-20 ENCOUNTER — Ambulatory Visit (INDEPENDENT_AMBULATORY_CARE_PROVIDER_SITE_OTHER): Payer: Medicare PPO | Admitting: Ophthalmology

## 2023-01-20 DIAGNOSIS — H35033 Hypertensive retinopathy, bilateral: Secondary | ICD-10-CM

## 2023-01-20 DIAGNOSIS — H34831 Tributary (branch) retinal vein occlusion, right eye, with macular edema: Secondary | ICD-10-CM

## 2023-01-20 DIAGNOSIS — H25813 Combined forms of age-related cataract, bilateral: Secondary | ICD-10-CM

## 2023-01-20 DIAGNOSIS — I10 Essential (primary) hypertension: Secondary | ICD-10-CM

## 2023-01-20 MED ORDER — BEVACIZUMAB CHEMO INJECTION 1.25MG/0.05ML SYRINGE FOR KALEIDOSCOPE
1.2500 mg | INTRAVITREAL | Status: AC | PRN
  Administered 2023-01-20: 1.25 mg via INTRAVITREAL

## 2023-01-21 ENCOUNTER — Ambulatory Visit
Admission: RE | Admit: 2023-01-21 | Discharge: 2023-01-21 | Disposition: A | Discharge status: Home / Self Care | Payer: Medicare PPO | Source: Ambulatory Visit | Attending: Internal Medicine | Admitting: Internal Medicine

## 2023-01-21 DIAGNOSIS — Z1231 Encounter for screening mammogram for malignant neoplasm of breast: Secondary | ICD-10-CM

## 2023-03-01 ENCOUNTER — Encounter (INDEPENDENT_AMBULATORY_CARE_PROVIDER_SITE_OTHER): Payer: Medicare PPO | Admitting: Ophthalmology

## 2023-03-16 NOTE — Progress Notes (Signed)
[facility] - Clinic Note  03/18/2023    CHIEF COMPLAINT Patient presents for Retina Follow Up   HISTORY OF PRESENT ILLNESS: Emily Reid is a 77 y.o. female who presents to the clinic today for:  HPI     Retina Follow Up   Patient presents with  CRVO/BRVO.  In right eye.  This started 7 weeks ago.  Duration of 7 weeks.  Since onset it is stable.  I, the attending physician,  performed the HPI with the patient and updated documentation appropriately.        Comments   7 week retina follow up BRVO and IVA OD pt is reporting vision is little blurred she can tell its time for her injection she is having some floaters but denies any flashes       Last edited by Rennis Chris, MD on 03/18/2023  5:40 PM.    Delayed f/u -- 8 wks again instead of 7 due to [place] travel  Referring physician: Lois Huxley MD [address] [place], [place] [address]  HISTORICAL INFORMATION:   Selected notes from the MEDICAL RECORD NUMBER Referred by Dr. Harless Litten for continuation of eye injections OD for BRVO with edema LEE: 08/05/2020 Ocular Hx- BRVO, CME, iritis OD PMH- HTN   CURRENT MEDICATIONS: No current outpatient medications on file. (Ophthalmic Drugs)   No current facility-administered medications for this visit. (Ophthalmic Drugs)   Current Outpatient Medications (Other)  Medication Sig   amLODipine (NORVASC) 10 MG tablet Take 1 tablet (10 mg total) by mouth daily.   aspirin EC 325 MG tablet Take 1 tablet (325 mg total) by mouth daily. (Patient not taking: Reported on 11/13/2021)   buPROPion (WELLBUTRIN XL) 150 MG 24 hr tablet Take 150 mg by mouth daily.   Coenzyme Q10 (COQ10) 100 MG CAPS Take by mouth daily.   LOSARTAN POTASSIUM PO Take 1 tablet by mouth daily.   mirtazapine (REMERON) 15 MG tablet TAKE 1 TABLET AT BEDTIME   Multiple Vitamin (MULTIVITAMIN) tablet Take 1 tablet by mouth daily.   oxyCODONE (OXY IR/ROXICODONE) 5 MG immediate release tablet  Take 1 tablet (5 mg total) by mouth every 4 (four) hours as needed (severe pain). (Patient not taking: Reported on 11/13/2021)   Phosphatidylserine 100 MG CAPS Take by mouth.   rosuvastatin (CRESTOR) 20 MG tablet Take 20 mg by mouth daily.   sertraline (ZOLOFT) 50 MG tablet TAKE 1 TABLET EVERY DAY   Turmeric (QC TUMERIC COMPLEX PO) Take 553 mg by mouth daily.   vitamin C (ASCORBIC ACID) 500 MG tablet Take 500 mg by mouth daily.   No current facility-administered medications for this visit. (Other)   REVIEW OF SYSTEMS: ROS   Positive for: Genitourinary, Eyes Negative for: Constitutional, Gastrointestinal, Neurological, Skin, Musculoskeletal, HENT, Endocrine, Cardiovascular, Respiratory, Psychiatric, Allergic/Imm, Heme/Lymph Last edited by Etheleen Mayhew, COT on 03/18/2023 10:06 AM.      ALLERGIES No Known Allergies  PAST MEDICAL HISTORY Past Medical History:  Diagnosis Date   Anxiety    Cataract    Mixed OU   Depression    Hyperlipidemia    Hypertension    Hypertensive retinopathy    OU   Left rotator cuff tear    Urine incontinence    Past Surgical History:  Procedure Laterality Date   FACIAL COSMETIC SURGERY     SHOULDER ARTHROSCOPY WITH SUBACROMIAL DECOMPRESSION, ROTATOR CUFF REPAIR AND BICEP TENDON REPAIR Left 11/17/2021   Procedure: LEFT SHOULDER ARTHROSCOPY WITH  ROTATOR CUFF REPAIR AND BICEPS TENODESIS;  Surgeon: Huel Cote, MD;  Location: [facility];  Service: Orthopedics;  Laterality: Left;   TONSILLECTOMY  1957   tummy tuck     FAMILY HISTORY Family History  Problem Relation Age of Onset   Heart disease Mother    Hyperlipidemia Mother    Hypertension Mother    Stroke Mother    SOCIAL HISTORY Social History   Tobacco Use   Smoking status: Never   Smokeless tobacco: Never  Vaping Use   Vaping status: Never Used  Substance Use Topics   Alcohol use: Yes    Alcohol/week: 7.0 standard drinks of alcohol    Types: 7 Glasses of wine  per week    Comment: social   Drug use: No       OPHTHALMIC EXAM:  Base Eye Exam     Visual Acuity (Snellen - Linear)       Right Left   Dist cc 20/30 -2 20/30 -1   Dist ph cc NI NI         Tonometry (Tonopen, 10:11 AM)       Right Left   Pressure 17 18         Pupils       Pupils Dark Light Shape React APD   Right PERRL 3 2 Round Brisk None   Left PERRL 3 2 Round Brisk None         Visual Fields       Left Right    Full Full         Extraocular Movement       Right Left    Full, Ortho Full, Ortho         Neuro/Psych     Oriented x3: Yes   Mood/Affect: Normal         Dilation     Both eyes: 2.5% Phenylephrine @ 10:12 AM           Slit Lamp and Fundus Exam     Slit Lamp Exam       Right Left   Lids/Lashes Dermatochalasis - upper lid Dermatochalasis - upper lid   Conjunctiva/Sclera White and quiet White and quiet   Cornea 1+ Punctate epithelial erosions, early band K nasal and temporal 1+ Punctate epithelial erosions, early band K nasal and temporal   Anterior Chamber Deep and quiet Deep and quiet   Iris Round and dilated Round and dilated   Lens 2+ Nuclear sclerosis, 1-2+ Cortical cataract, focal Posterior subcapsular cataract at 0700 2+ Nuclear sclerosis, 2+ Cortical cataract, focal Posterior subcapsular cataract at 0700   Anterior Vitreous Vitreous syneresis, silicone oil micro bubbles Vitreous syneresis, silicone oil micro bubbles, Posterior vitreous detachment         Fundus Exam       Right Left   Disc Pink and Sharp, mild PPA Pink and Sharp   C/D Ratio 0.2 0.2   Macula Flat, Blunted foveal reflex, Drusen, RPE mottling, No heme, mild interval increase in cystic changes / edema inferior macula and fovea Flat, good foveal reflex, Drusen, RPE mottling and clumping, No heme or edema   Vessels attenuated, Tortuous attenuated, mild tortuosity   Periphery Attached, no heme Attached, mild mid zonal drusen, no heme               Refraction     Wearing Rx       Sphere Cylinder Axis Add   Right +0.25 +0.50 023 +2.50  Left +2.00 +0.25 030 +2.50           IMAGING AND PROCEDURES  Imaging and Procedures for 03/18/2023  OCT, Retina - OU - Both Eyes       Right Eye Quality was good. Central Foveal Thickness: 481. Progression has worsened. Findings include normal foveal contour, no SRF, abnormal foveal contour, intraretinal fluid (Hx of BRVO, interval increase in IRF / cystic changes inferior fovea and mac).   Left Eye Quality was good. Central Foveal Thickness: 291. Progression has been stable. Findings include normal foveal contour, no IRF, no SRF, retinal drusen .   Notes *Images captured and stored on drive  Diagnosis / Impression:  OD: Hx of BRVO -- interval increase in IRF / cystic changes inferior fovea and mac OS: NFP, no IRF/SRF, +drusen  Clinical management:  See below  Abbreviations: NFP - Normal foveal profile. CME - cystoid macular edema. PED - pigment epithelial detachment. IRF - intraretinal fluid. SRF - subretinal fluid. EZ - ellipsoid zone. ERM - epiretinal membrane. ORA - outer retinal atrophy. ORT - outer retinal tubulation. SRHM - subretinal hyper-reflective material. IRHM - intraretinal hyper-reflective material      Intravitreal Injection, Pharmacologic Agent - OD - Right Eye       Time Out 03/18/2023. 11:18 AM. Confirmed correct patient, procedure, site, and patient consented.   Anesthesia Topical anesthesia was used. Anesthetic medications included Lidocaine 2%, Proparacaine 0.5%.   Procedure Preparation included 5% betadine to ocular surface, eyelid speculum. A (32g) needle was used.   Injection: 2 mg aflibercept 2 MG/0.05ML   Route: Intravitreal, Site: Right Eye   NDC: L6038910, Lot: [id number], Expiration date: 04/27/2024, Waste: 0 mL   Post-op Post injection exam found visual acuity of at least counting fingers. The patient tolerated the procedure well.  There were no complications. The patient received written and verbal post procedure care education. Post injection medications were not given.            ASSESSMENT/PLAN:    ICD-10-CM   1. Branch retinal vein occlusion of right eye with macular edema  H34.8310 OCT, Retina - OU - Both Eyes    Intravitreal Injection, Pharmacologic Agent - OD - Right Eye    aflibercept (EYLEA) SOLN 2 mg    2. Essential hypertension  I10     3. Hypertensive retinopathy of both eyes  H35.033     4. Combined forms of age-related cataract of both eyes  H25.813      1. BRVO w/ CME OD  - delayed f/u -- 8 wks instead of 7 (07.25.24 to 09.20.24) due to [place] travel - delayed to follow up from 9 weeks to 13 weeks (11.01.23 - 01.31.24) -- worse edema - s/p IVA OD #1 (03.30.22), #2 (05.23.22), #3 (7.25.22), #4 (10.05.22), #5 (12.14.22), #6 (03.15.23), #7 (05.31.23), #8 (08.16.23), #9 (11.01.23), #10 (01.31.24), #11 (03.27.24), #12 (05.29.24), #13 (07.25.24) - hx of injections with Dr. Harless Litten in [place], [place] -- pt was receiving Lucentis q7-8 wks; pt reports history Avastin, but was switched to Lucentis at some point - last injection IVL OD was 02.04.22 (7.5 wks prior to initial visit here) - pt reports hx of worsening vision and OCT around 10 weeks - **worsening of IRF on OCT at 13 weeks on 01.31.24 and 03.15.23; 11 wks on 11.01.23; 8 wks on 07.25.24 and 09.20.24** - BCVA OD 20/30 from 20/25 - OCT today shows interval increase in IRF / cystic changes inferior fovea and mac at 8 weeks **discussed decreased  efficacy / resistance to Avastin and potential benefit of switching medication**  - recommend switching to IVE OD #1 today, 09.20.24 -- w/ f/u back to 6 wks - RBA of procedure discussed, questions answered - IVE informed consent obtained and signed, 09.20.23 (OD) - see procedure note  - approved for Eylea, but Good Days denied due to income restrictions - F/U 6 weeks -- DFE/OCT/possible injection  2,3.  Hypertensive retinopathy OU - discussed importance of tight BP control - continue to monitor  4. Mixed Cataract OU - The symptoms of cataract, surgical options, and treatments and risks were discussed with patient. - discussed diagnosis and progression - monitor for now   Ophthalmic Meds Ordered this visit:  Meds ordered this encounter  Medications   aflibercept (EYLEA) SOLN 2 mg     Return in 6 weeks (on 04/29/2023) for BRVO OD, DFE, OCT, Possible Injxn.  There are no Patient Instructions on file for this visit.  This document serves as a record of services personally performed by Karie Chimera, MD, PhD. It was created on their behalf by Glee Arvin. Manson Passey, OA an ophthalmic technician. The creation of this record is the provider's dictation and/or activities during the visit.    Electronically signed by: Glee Arvin. Manson Passey, OA 03/19/23 12:59 AM   Karie Chimera, M.D., Ph.D. Diseases & Surgery of the Retina and Vitreous [facility]  I have reviewed the above documentation for accuracy and completeness, and I agree with the above. Karie Chimera, M.D., Ph.D. 03/19/23 1:02 AM   Abbreviations: M myopia (nearsighted); A astigmatism; H hyperopia (farsighted); P presbyopia; Mrx spectacle prescription;  CTL contact lenses; OD right eye; OS left eye; OU both eyes  XT exotropia; ET esotropia; PEK punctate epithelial keratitis; PEE punctate epithelial erosions; DES dry eye syndrome; MGD meibomian gland dysfunction; ATs artificial tears; PFAT's preservative free artificial tears; NSC nuclear sclerotic cataract; PSC posterior subcapsular cataract; ERM epi-retinal membrane; PVD posterior vitreous detachment; RD retinal detachment; DM diabetes mellitus; DR diabetic retinopathy; NPDR non-proliferative diabetic retinopathy; PDR proliferative diabetic retinopathy; CSME clinically significant macular edema; DME diabetic macular edema; dbh dot blot hemorrhages; CWS cotton wool spot;  POAG primary open angle glaucoma; C/D cup-to-disc ratio; HVF humphrey visual field; GVF goldmann visual field; OCT optical coherence tomography; IOP intraocular pressure; BRVO Branch retinal vein occlusion; CRVO central retinal vein occlusion; CRAO central retinal artery occlusion; BRAO branch retinal artery occlusion; RT retinal tear; SB scleral buckle; PPV pars plana vitrectomy; VH Vitreous hemorrhage; PRP panretinal laser photocoagulation; IVK intravitreal kenalog; VMT vitreomacular traction; MH Macular hole;  NVD neovascularization of the disc; NVE neovascularization elsewhere; AREDS age related eye disease study; ARMD age related macular degeneration; POAG primary open angle glaucoma; EBMD epithelial/anterior basement membrane dystrophy; ACIOL anterior chamber intraocular lens; IOL intraocular lens; PCIOL posterior chamber intraocular lens; Phaco/IOL phacoemulsification with intraocular lens placement; PRK photorefractive keratectomy; LASIK laser assisted in situ keratomileusis; HTN hypertension; DM diabetes mellitus; COPD chronic obstructive pulmonary disease

## 2023-03-18 ENCOUNTER — Encounter (INDEPENDENT_AMBULATORY_CARE_PROVIDER_SITE_OTHER): Payer: Self-pay | Admitting: Ophthalmology

## 2023-03-18 ENCOUNTER — Ambulatory Visit (INDEPENDENT_AMBULATORY_CARE_PROVIDER_SITE_OTHER): Payer: Medicare PPO | Admitting: Ophthalmology

## 2023-03-18 DIAGNOSIS — I10 Essential (primary) hypertension: Secondary | ICD-10-CM

## 2023-03-18 DIAGNOSIS — H34831 Tributary (branch) retinal vein occlusion, right eye, with macular edema: Secondary | ICD-10-CM | POA: Diagnosis not present

## 2023-03-18 DIAGNOSIS — H35033 Hypertensive retinopathy, bilateral: Secondary | ICD-10-CM

## 2023-03-18 DIAGNOSIS — H25813 Combined forms of age-related cataract, bilateral: Secondary | ICD-10-CM | POA: Diagnosis not present

## 2023-03-18 MED ORDER — AFLIBERCEPT 2MG/0.05ML IZ SOLN FOR KALEIDOSCOPE
2.0000 mg | INTRAVITREAL | Status: AC | PRN
  Administered 2023-03-18: 2 mg via INTRAVITREAL

## 2023-04-28 NOTE — Progress Notes (Signed)
[facility] - Clinic Note  04/29/2023    CHIEF COMPLAINT Patient presents for Retina Follow Up   HISTORY OF PRESENT ILLNESS: Emily Reid is a 77 y.o. female who presents to the clinic today for:  HPI     Retina Follow Up   Patient presents with  CRVO/BRVO.  In right eye.  This started 6 weeks ago.  I, the attending physician,  performed the HPI with the patient and updated documentation appropriately.        Comments   Patient here for 6 weeks retina follow up for BRVO OD. Patient states vision doing pretty good. No eye pain. Uses Systane prn.      Last edited by Rennis Chris, MD on 04/29/2023 12:30 PM.    Patient states that she had no issues with changing to [facility] from Avastin.  Referring physician: Lois Huxley MD [address] [place], [place] [address]  HISTORICAL INFORMATION:   Selected notes from the MEDICAL RECORD NUMBER Referred by Dr. Harless Litten for continuation of eye injections OD for BRVO with edema LEE: 08/05/2020 Ocular Hx- BRVO, CME, iritis OD PMH- HTN   CURRENT MEDICATIONS: No current outpatient medications on file. (Ophthalmic Drugs)   No current facility-administered medications for this visit. (Ophthalmic Drugs)   Current Outpatient Medications (Other)  Medication Sig   amLODipine (NORVASC) 10 MG tablet Take 1 tablet (10 mg total) by mouth daily.   buPROPion (WELLBUTRIN XL) 150 MG 24 hr tablet Take 150 mg by mouth daily.   Coenzyme Q10 (COQ10) 100 MG CAPS Take by mouth daily.   LOSARTAN POTASSIUM PO Take 1 tablet by mouth daily.   mirtazapine (REMERON) 15 MG tablet TAKE 1 TABLET AT BEDTIME   Multiple Vitamin (MULTIVITAMIN) tablet Take 1 tablet by mouth daily.   Phosphatidylserine 100 MG CAPS Take by mouth.   rosuvastatin (CRESTOR) 20 MG tablet Take 20 mg by mouth daily.   sertraline (ZOLOFT) 50 MG tablet TAKE 1 TABLET EVERY DAY   Turmeric (QC TUMERIC COMPLEX PO) Take 553 mg by mouth daily.   vitamin C (ASCORBIC  ACID) 500 MG tablet Take 500 mg by mouth daily.   aspirin EC 325 MG tablet Take 1 tablet (325 mg total) by mouth daily. (Patient not taking: Reported on 11/13/2021)   oxyCODONE (OXY IR/ROXICODONE) 5 MG immediate release tablet Take 1 tablet (5 mg total) by mouth every 4 (four) hours as needed (severe pain). (Patient not taking: Reported on 11/13/2021)   No current facility-administered medications for this visit. (Other)   REVIEW OF SYSTEMS: ROS   Positive for: Genitourinary, Eyes Negative for: Constitutional, Gastrointestinal, Neurological, Skin, Musculoskeletal, HENT, Endocrine, Cardiovascular, Respiratory, Psychiatric, Allergic/Imm, Heme/Lymph Last edited by Laddie Aquas, COA on 04/29/2023  9:45 AM.     ALLERGIES No Known Allergies  PAST MEDICAL HISTORY Past Medical History:  Diagnosis Date   Anxiety    Cataract    Mixed OU   Depression    Hyperlipidemia    Hypertension    Hypertensive retinopathy    OU   Left rotator cuff tear    Urine incontinence    Past Surgical History:  Procedure Laterality Date   FACIAL COSMETIC SURGERY     SHOULDER ARTHROSCOPY WITH SUBACROMIAL DECOMPRESSION, ROTATOR CUFF REPAIR AND BICEP TENDON REPAIR Left 11/17/2021   Procedure: LEFT SHOULDER ARTHROSCOPY WITH ROTATOR CUFF REPAIR AND BICEPS TENODESIS;  Surgeon: Huel Cote, MD;  Location: [facility];  Service: Orthopedics;  Laterality: Left;  TONSILLECTOMY  1957   tummy tuck     FAMILY HISTORY Family History  Problem Relation Age of Onset   Heart disease Mother    Hyperlipidemia Mother    Hypertension Mother    Stroke Mother    SOCIAL HISTORY Social History   Tobacco Use   Smoking status: Never   Smokeless tobacco: Never  Vaping Use   Vaping status: Never Used  Substance Use Topics   Alcohol use: Yes    Alcohol/week: 7.0 standard drinks of alcohol    Types: 7 Glasses of wine per week    Comment: social   Drug use: No       OPHTHALMIC EXAM:  Base Eye Exam      Visual Acuity (Snellen - Linear)       Right Left   Dist cc 20/25 20/20 -2   Dist ph cc 20/25 +2     Correction: Glasses         Tonometry (Tonopen, 9:43 AM)       Right Left   Pressure 15 16         Pupils       Dark Light Shape React APD   Right 3 2 Round Brisk None   Left 3 2 Round Brisk None         Visual Fields (Counting fingers)       Left Right    Full Full         Extraocular Movement       Right Left    Full, Ortho Full, Ortho         Neuro/Psych     Oriented x3: Yes   Mood/Affect: Normal         Dilation     Both eyes: 1.0% Mydriacyl, 2.5% Phenylephrine @ 9:43 AM           Slit Lamp and Fundus Exam     Slit Lamp Exam       Right Left   Lids/Lashes Dermatochalasis - upper lid Dermatochalasis - upper lid   Conjunctiva/Sclera White and quiet White and quiet   Cornea 1+ Punctate epithelial erosions, early band K nasal and temporal 1+ Punctate epithelial erosions, early band K nasal and temporal   Anterior Chamber Deep and quiet Deep and quiet   Iris Round and dilated Round and dilated   Lens 2+ Nuclear sclerosis, 1-2+ Cortical cataract, focal Posterior subcapsular cataract at 0700 2+ Nuclear sclerosis, 2+ Cortical cataract, focal Posterior subcapsular cataract at 0700   Anterior Vitreous Vitreous syneresis, silicone oil micro bubbles Vitreous syneresis, silicone oil micro bubbles, Posterior vitreous detachment         Fundus Exam       Right Left   Disc Pink and Sharp, mild PPA Pink and Sharp   C/D Ratio 0.2 0.2   Macula Flat, Blunted foveal reflex, Drusen, RPE mottling, No heme, mild interval improvement in central cystic changes / edema inferior macula and fovea Flat, good foveal reflex, Drusen, RPE mottling and clumping, No heme or edema   Vessels attenuated, Tortuous attenuated, mild tortuosity   Periphery Attached, no heme Attached, mild mid zonal drusen, no heme              Refraction     Wearing Rx        Sphere Cylinder Axis Add   Right +0.25 +0.50 023 +2.50   Left +2.00 +0.25 030 +2.50           IMAGING  AND PROCEDURES  Imaging and Procedures for 04/29/2023  OCT, Retina - OU - Both Eyes       Right Eye Quality was good. Central Foveal Thickness: 282. Progression has improved. Findings include normal foveal contour, no SRF, intraretinal fluid (Hx of BRVO, interval improvement in IRF/edema--just trace central cystic changes remain).   Left Eye Quality was good. Central Foveal Thickness: 291. Progression has been stable. Findings include normal foveal contour, no IRF, no SRF, retinal drusen .   Notes *Images captured and stored on drive  Diagnosis / Impression:  OD: Hx of BRVO, interval improvement in IRF/edema--just trace central cystic changes remain OS: NFP, no IRF/SRF, +drusen  Clinical management:  See below  Abbreviations: NFP - Normal foveal profile. CME - cystoid macular edema. PED - pigment epithelial detachment. IRF - intraretinal fluid. SRF - subretinal fluid. EZ - ellipsoid zone. ERM - epiretinal membrane. ORA - outer retinal atrophy. ORT - outer retinal tubulation. SRHM - subretinal hyper-reflective material. IRHM - intraretinal hyper-reflective material      Intravitreal Injection, Pharmacologic Agent - OD - Right Eye       Time Out 04/29/2023. 9:57 AM. Confirmed correct patient, procedure, site, and patient consented.   Anesthesia Topical anesthesia was used. Anesthetic medications included Lidocaine 2%, Proparacaine 0.5%.   Procedure Preparation included 5% betadine to ocular surface, eyelid speculum. A (32g) needle was used.   Injection: 2 mg aflibercept 2 MG/0.05ML   Route: Intravitreal, Site: Right Eye   NDC: L6038910, Lot: [id number], Expiration date: 04/27/2024, Waste: 0 mL   Post-op Post injection exam found visual acuity of at least counting fingers. The patient tolerated the procedure well. There were no complications. The patient  received written and verbal post procedure care education. Post injection medications were not given.            ASSESSMENT/PLAN:    ICD-10-CM   1. Branch retinal vein occlusion of right eye with macular edema  H34.8310 OCT, Retina - OU - Both Eyes    Intravitreal Injection, Pharmacologic Agent - OD - Right Eye    aflibercept (EYLEA) SOLN 2 mg    2. Essential hypertension  I10     3. Hypertensive retinopathy of both eyes  H35.033     4. Combined forms of age-related cataract of both eyes  H25.813      1. BRVO w/ CME OD  - delayed f/u -- 8 wks instead of 7 (07.25.24 to 09.20.24) due to [place] travel - delayed to follow up from 9 weeks to 13 weeks (11.01.23 - 01.31.24) -- worse edema - s/p IVA OD #1 (03.30.22), #2 (05.23.22), #3 (7.25.22), #4 (10.05.22), #5 (12.14.22), #6 (03.15.23), #7 (05.31.23), #8 (08.16.23), #9 (11.01.23), #10 (01.31.24), #11 (03.27.24), #12 (05.29.24), #13 (07.25.24)- IVA Resistance =============================================================== - IVE #1(09.20.24) - hx of injections with Dr. Harless Litten in [place], [place] -- pt was receiving Lucentis q7-8 wks; pt reports history Avastin, but was switched to Lucentis at some point - last injection IVL OD was 02.04.22 (7.5 wks prior to initial visit here) - pt reports hx of worsening vision and OCT around 10 weeks - **worsening of IRF on OCT at 13 weeks on 01.31.24 and 03.15.23; 11 wks on 11.01.23; 8 wks on 07.25.24 and 09.20.24** - BCVA OD 20/25 - OCT today shows Hx of BRVO, interval improvement in IRF/edema--just trace central cystic changes remain at 6 weeks - recommend IVE OD #2 today, 11.01.24 -- w/ f/u in 6 wks - RBA of procedure discussed, questions answered -  IVE informed consent obtained and signed, 09.20.24 (OD) - see procedure note  - approved for Eylea, but Good Days denied due to income restrictions - F/U 6 weeks -- DFE/OCT/possible injection  2,3. Hypertensive retinopathy OU - discussed importance  of tight BP control - continue to monitor  4. Mixed Cataract OU - The symptoms of cataract, surgical options, and treatments and risks were discussed with patient. - discussed diagnosis and progression - monitor for now   Ophthalmic Meds Ordered this visit:  Meds ordered this encounter  Medications   aflibercept (EYLEA) SOLN 2 mg     Return in about 6 weeks (around 06/10/2023) for f/u BRVO OD, DFE, OCT, Possible, IVE, OD.  There are no Patient Instructions on file for this visit.  This document serves as a record of services personally performed by Karie Chimera, MD, PhD. It was created on their behalf by Berlin Hun COT, an ophthalmic technician. The creation of this record is the provider's dictation and/or activities during the visit.    Electronically signed by: Berlin Hun COT 10.31.24 12:50 PM  This document serves as a record of services personally performed by Karie Chimera, MD, PhD. It was created on their behalf by Charlette Caffey, COT an ophthalmic technician. The creation of this record is the provider's dictation and/or activities during the visit.    Electronically signed by:  Charlette Caffey, COT  04/29/23 12:50 PM  Karie Chimera, M.D., Ph.D. Diseases & Surgery of the Retina and Vitreous [facility] 04/29/2023   I have reviewed the above documentation for accuracy and completeness, and I agree with the above. Karie Chimera, M.D., Ph.D. 04/29/23 12:51 PM   Abbreviations: M myopia (nearsighted); A astigmatism; H hyperopia (farsighted); P presbyopia; Mrx spectacle prescription;  CTL contact lenses; OD right eye; OS left eye; OU both eyes  XT exotropia; ET esotropia; PEK punctate epithelial keratitis; PEE punctate epithelial erosions; DES dry eye syndrome; MGD meibomian gland dysfunction; ATs artificial tears; PFAT's preservative free artificial tears; NSC nuclear sclerotic cataract; PSC posterior subcapsular  cataract; ERM epi-retinal membrane; PVD posterior vitreous detachment; RD retinal detachment; DM diabetes mellitus; DR diabetic retinopathy; NPDR non-proliferative diabetic retinopathy; PDR proliferative diabetic retinopathy; CSME clinically significant macular edema; DME diabetic macular edema; dbh dot blot hemorrhages; CWS cotton wool spot; POAG primary open angle glaucoma; C/D cup-to-disc ratio; HVF humphrey visual field; GVF goldmann visual field; OCT optical coherence tomography; IOP intraocular pressure; BRVO Branch retinal vein occlusion; CRVO central retinal vein occlusion; CRAO central retinal artery occlusion; BRAO branch retinal artery occlusion; RT retinal tear; SB scleral buckle; PPV pars plana vitrectomy; VH Vitreous hemorrhage; PRP panretinal laser photocoagulation; IVK intravitreal kenalog; VMT vitreomacular traction; MH Macular hole;  NVD neovascularization of the disc; NVE neovascularization elsewhere; AREDS age related eye disease study; ARMD age related macular degeneration; POAG primary open angle glaucoma; EBMD epithelial/anterior basement membrane dystrophy; ACIOL anterior chamber intraocular lens; IOL intraocular lens; PCIOL posterior chamber intraocular lens; Phaco/IOL phacoemulsification with intraocular lens placement; PRK photorefractive keratectomy; LASIK laser assisted in situ keratomileusis; HTN hypertension; DM diabetes mellitus; COPD chronic obstructive pulmonary disease

## 2023-04-29 ENCOUNTER — Encounter (INDEPENDENT_AMBULATORY_CARE_PROVIDER_SITE_OTHER): Payer: Self-pay | Admitting: Ophthalmology

## 2023-04-29 ENCOUNTER — Ambulatory Visit (INDEPENDENT_AMBULATORY_CARE_PROVIDER_SITE_OTHER): Payer: Medicare PPO | Admitting: Ophthalmology

## 2023-04-29 DIAGNOSIS — H34831 Tributary (branch) retinal vein occlusion, right eye, with macular edema: Secondary | ICD-10-CM

## 2023-04-29 DIAGNOSIS — I10 Essential (primary) hypertension: Secondary | ICD-10-CM

## 2023-04-29 DIAGNOSIS — H25813 Combined forms of age-related cataract, bilateral: Secondary | ICD-10-CM | POA: Diagnosis not present

## 2023-04-29 DIAGNOSIS — H35033 Hypertensive retinopathy, bilateral: Secondary | ICD-10-CM | POA: Diagnosis not present

## 2023-04-29 MED ORDER — AFLIBERCEPT 2MG/0.05ML IZ SOLN FOR KALEIDOSCOPE
2.0000 mg | INTRAVITREAL | Status: AC | PRN
  Administered 2023-04-29: 2 mg via INTRAVITREAL

## 2023-05-13 DIAGNOSIS — I129 Hypertensive chronic kidney disease with stage 1 through stage 4 chronic kidney disease, or unspecified chronic kidney disease: Secondary | ICD-10-CM | POA: Diagnosis not present

## 2023-05-13 DIAGNOSIS — Z800 Family history of malignant neoplasm of digestive organs: Secondary | ICD-10-CM | POA: Diagnosis not present

## 2023-05-13 DIAGNOSIS — N1832 Chronic kidney disease, stage 3b: Secondary | ICD-10-CM | POA: Diagnosis not present

## 2023-05-13 DIAGNOSIS — H34831 Tributary (branch) retinal vein occlusion, right eye, with macular edema: Secondary | ICD-10-CM | POA: Diagnosis not present

## 2023-05-13 DIAGNOSIS — D72819 Decreased white blood cell count, unspecified: Secondary | ICD-10-CM | POA: Diagnosis not present

## 2023-05-13 DIAGNOSIS — E785 Hyperlipidemia, unspecified: Secondary | ICD-10-CM | POA: Diagnosis not present

## 2023-05-20 DIAGNOSIS — Z800 Family history of malignant neoplasm of digestive organs: Secondary | ICD-10-CM | POA: Diagnosis not present

## 2023-05-20 DIAGNOSIS — K5730 Diverticulosis of large intestine without perforation or abscess without bleeding: Secondary | ICD-10-CM | POA: Diagnosis not present

## 2023-05-20 DIAGNOSIS — D122 Benign neoplasm of ascending colon: Secondary | ICD-10-CM | POA: Diagnosis not present

## 2023-05-20 DIAGNOSIS — Z1211 Encounter for screening for malignant neoplasm of colon: Secondary | ICD-10-CM | POA: Diagnosis not present

## 2023-05-24 DIAGNOSIS — D122 Benign neoplasm of ascending colon: Secondary | ICD-10-CM | POA: Diagnosis not present

## 2023-06-01 IMAGING — MR MR SHOULDER*L* W/O CM
4 of 6 series · 27 of 40 positions shown · non-contrast
Comparison: Left shoulder radiographs 07/07/2021

CLINICAL DATA: Shoulder pain, chronic. Injury lifting weights.
Further evaluation of sclerotic focus overlying the anterior aspect
of the humeral head on axillary radiograph.

EXAM:
MRI OF THE LEFT SHOULDER WITHOUT CONTRAST
TECHNIQUE: Multiplanar, multisequence MR imaging of the shoulder was performed.
No intravenous contrast was administered.

[Series 6: T2 fat-sat · axial · left · 3.0mm · 0.47mm/px · z∈[-41,+59]mm · 8 of 27 slices shown (1 of 2)]
[im 1/27]
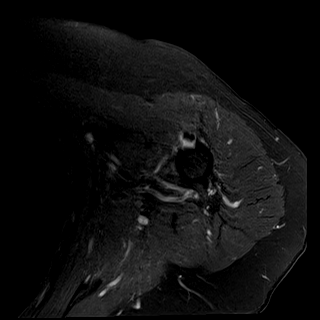
[im 4/27]
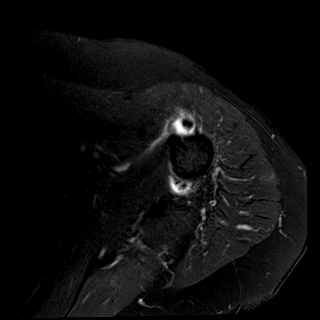
[im 8/27]
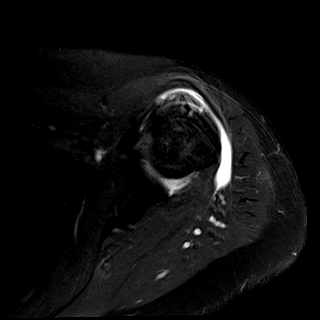
[im 12/27]
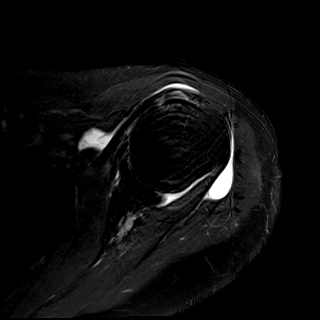
[im 15/27]
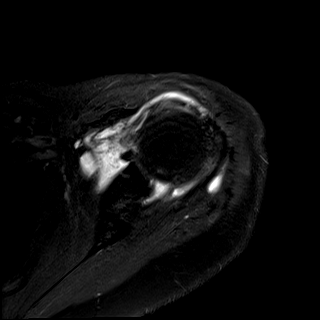
[im 19/27]
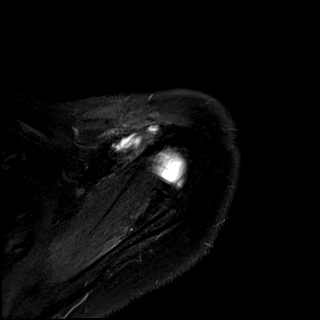
[im 23/27]
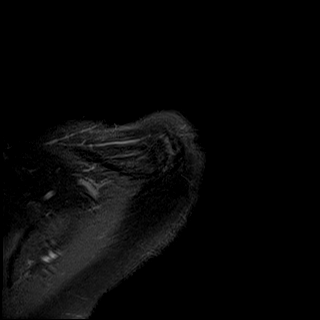
[im 27/27]
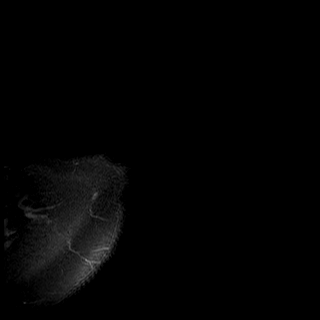

[Series 9: T2 fat-sat · oblique · left · 4.0mm · 0.44mm/px · 5 of 23 slices shown (2 of 2)]
[im 1/23]
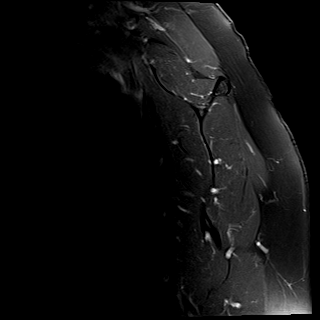
[im 5/23]
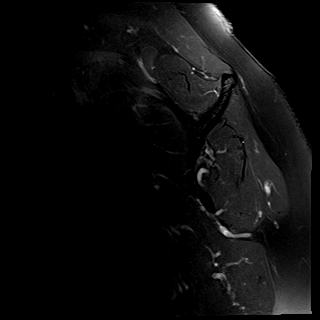
[im 9/23]
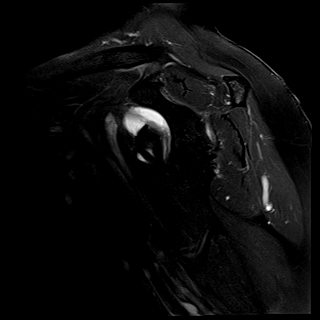
[im 14/23]
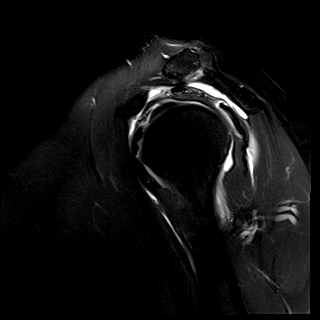
[im 23/23]
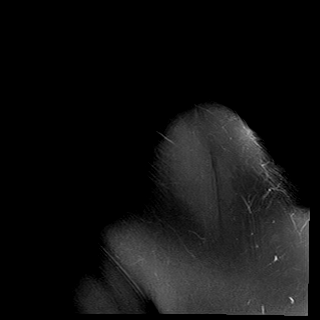

[Series 11: PD fat-sat · axial · left · 3.0mm · 0.47mm/px · z∈[-41,+60]mm · 8 of 27 slices shown]
[im 1/27]
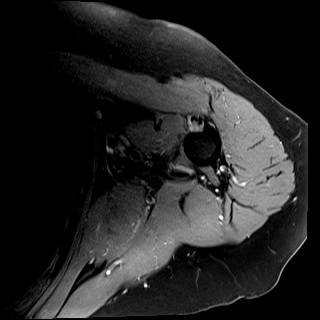
[im 4/27]
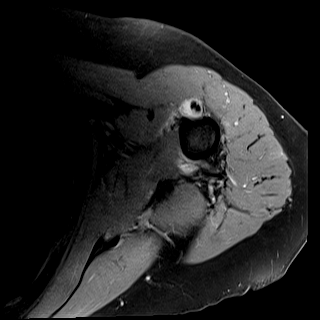
[im 8/27]
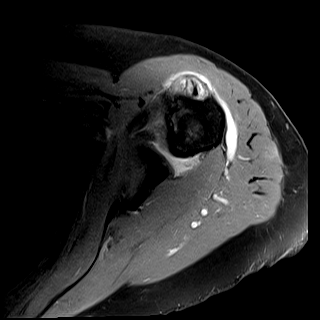
[im 12/27]
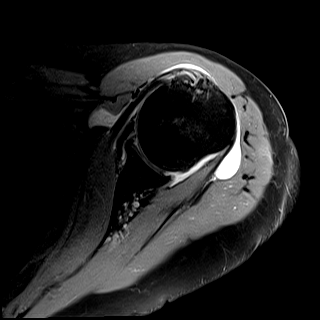
[im 15/27]
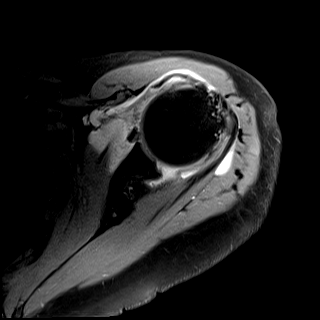
[im 19/27]
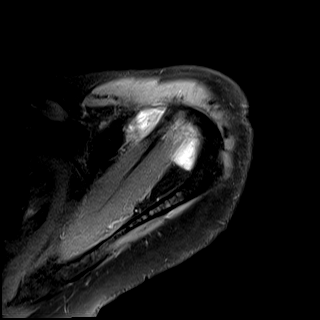
[im 23/27]
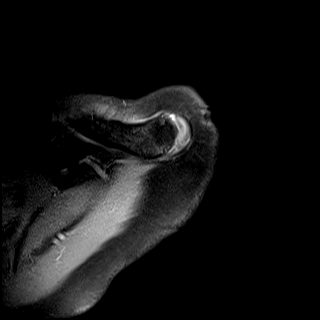
[im 27/27]
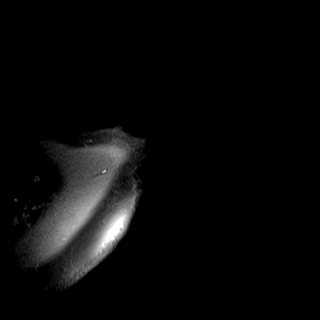

[Series 101: PD · oblique · left · 4.0mm · 0.22mm/px · 6 of 21 slices shown]
[im 1/21]
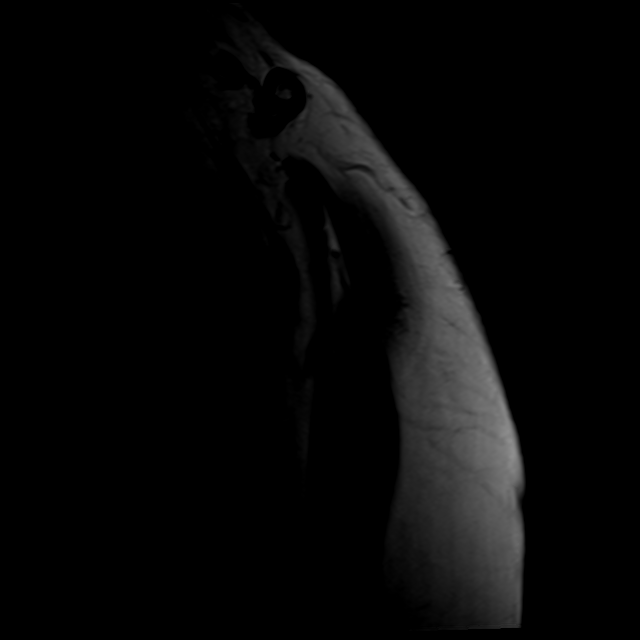
[im 5/21]
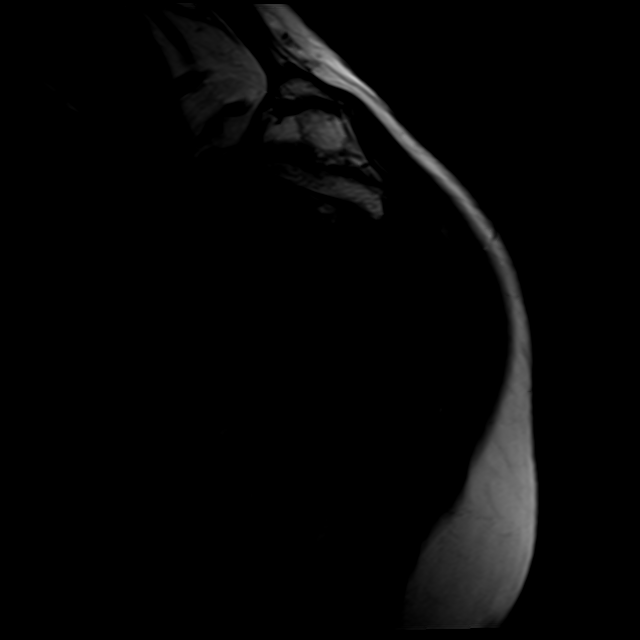
[im 9/21]
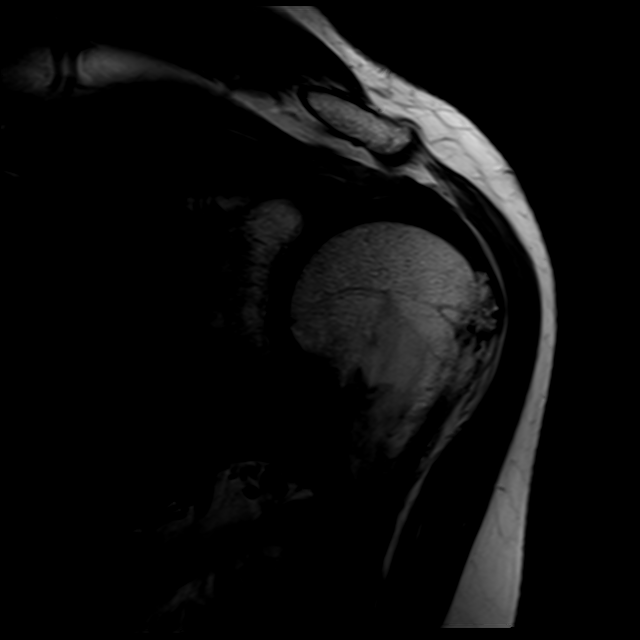
[im 13/21]
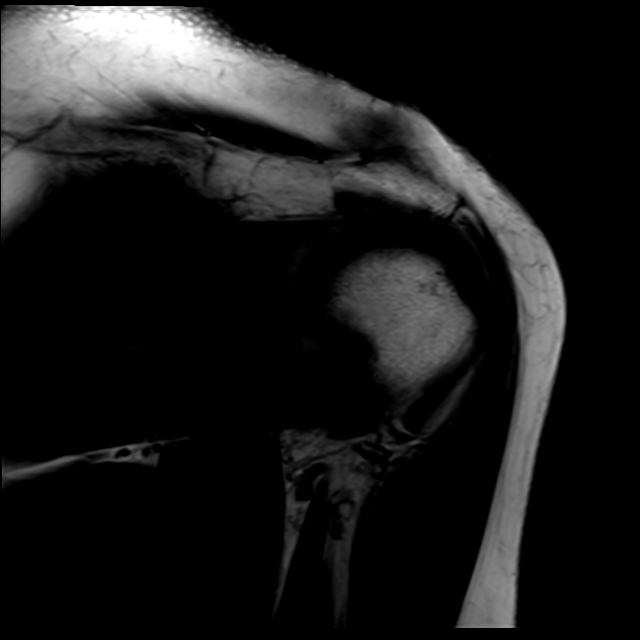
[im 17/21]
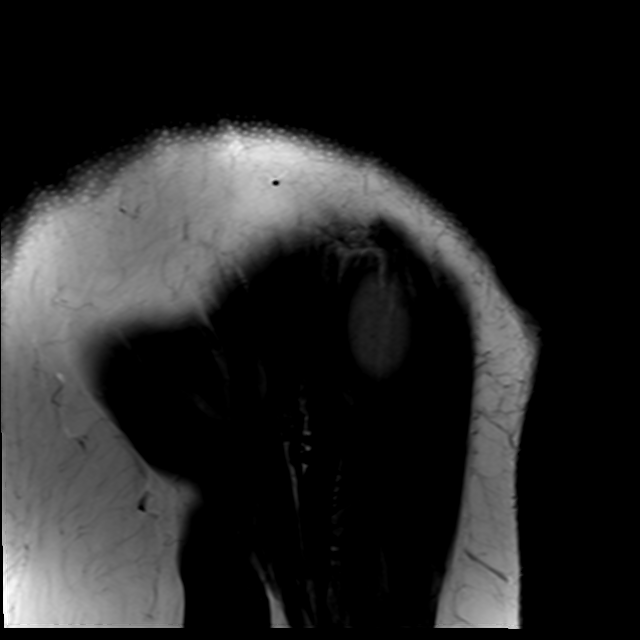
[im 21/21]
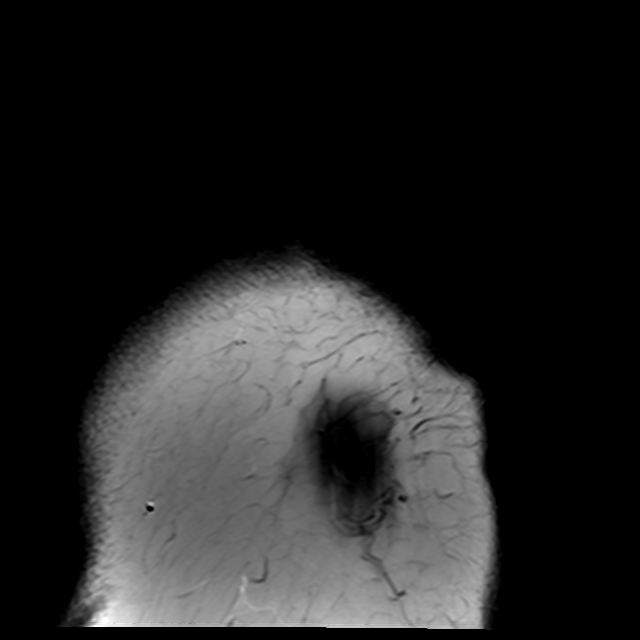

[27 of 40 positions shown; findings below may reference images not displayed]

FINDINGS: Rotator cuff: There is high-grade partial-thickness tearing of the
mid and anterior supraspinatus tendon footprint and region measuring
up to 15 mm in AP dimension (sagittal image 4). There is greater
than 90% tearing of the articular greater than bursal sided tendon
fibers with up to 13 mm tendon retraction (coronal images 10 through
12). Mild diffuse infraspinatus tendinosis. Mild fluid at the
anterior and deep aspects of the infraspinatus musculotendinous
junction (sagittal image [DATE] be mild attenuation of the
insertion of the deep articular sided fibers of the subscapularis
tendon. The teres minor is intact.

Muscles: No rotator cuff muscle atrophy, fatty infiltration, or
edema.

Biceps long head: Mild proximal long head of the biceps tendinosis.
There also may be mild partial-thickness tearing and attenuation of
the proximal biceps tendon proximal to the bicipital groove. There
is high-grade attenuation of the tendon within the superior aspect
of the bicipital groove (axial images 16-18), and interstitial
tearing within the tendon within the more distal aspect of the
bicipital groove (axial image 27).

Acromioclavicular Joint: There are mild degenerative changes of the
acromioclavicular joint including joint space narrowing, subchondral
marrow edema, and peripheral osteophytosis. Type II acromion.

Moderate fluid within the subacromial/subdeltoid bursa.

Glenohumeral Joint: Moderate thinning of the inferior medial humeral
head cartilage and mid and superior glenoid cartilage with mild
peripheral glenoid degenerative osteophytes.

Labrum: Mild degenerative irregularity of the glenoid labrum
diffusely.

Bones: Unfortunately, there is moderate patient motion on axial T2
weighted images. There is minimal patient motion on axial proton
density weighted images. On axillary radiograph 07/07/2021 there was
question of a sclerotic region within the humeral head in the region
anteriorly that also overlaps the acromion. No definite abnormality
is seen within this region of the humeral head on MRI, and this may
have been related to bone overlap in this region including
degenerative osteophytosis extending anteriorly from the inferior
aspect of the lesser tuberosity of the humeral head (axial images 18
and 19). No suspicious bone marrow lesion is seen.
IMPRESSION: :
IMPRESSION: 1. No suspicious bone lesion is seen, with attention to the anterior
humeral head where there was question of sclerosis on prior
radiographs. This appearance on radiographs may have been due to
overlapping bones of the humeral head and acromion including
moderate anteriorly directed osteophytes at the inferior aspect of
the lesser tuberosity of the humeral head.
2. High-grade partial-thickness tearing of the mid and anterior
supraspinatus tendon footprint.
3. Likely mild partial-thickness tearing of the subscapularis tendon
insertion.
4. Proximal long head of the biceps tendinosis with mild
partial-thickness tearing.
5. Mild degenerative changes of the acromioclavicular joint and
mild-to-moderate degenerative changes of the glenohumeral joint.

## 2023-06-09 NOTE — Progress Notes (Shared)
[facility] - Clinic Note  06/10/2023    CHIEF COMPLAINT Patient presents for Retina Follow Up   HISTORY OF PRESENT ILLNESS: Emily Reid is a 77 y.o. female who presents to the clinic today for:  HPI     Retina Follow Up   Patient presents with  CRVO/BRVO.  In both eyes.  This started 6 weeks ago.  Duration of 6 weeks.  Since onset it is stable.  I, the attending physician,  performed the HPI with the patient and updated documentation appropriately.        Comments   6 week retina follow up BRVO OD I'VE OD pt is reporting no vision changes noticed she denies any flashes or floaters       Last edited by Rennis Chris, MD on 06/11/2023 12:07 AM.    Pt states vision is stable  Referring physician: Lois Huxley MD [address] [place], [place] [address]  HISTORICAL INFORMATION:   Selected notes from the MEDICAL RECORD NUMBER Referred by Dr. Harless Litten for continuation of eye injections OD for BRVO with edema LEE: 08/05/2020 Ocular Hx- BRVO, CME, iritis OD PMH- HTN   CURRENT MEDICATIONS: No current outpatient medications on file. (Ophthalmic Drugs)   No current facility-administered medications for this visit. (Ophthalmic Drugs)   Current Outpatient Medications (Other)  Medication Sig   amLODipine (NORVASC) 10 MG tablet Take 1 tablet (10 mg total) by mouth daily.   aspirin EC 325 MG tablet Take 1 tablet (325 mg total) by mouth daily. (Patient not taking: Reported on 11/13/2021)   buPROPion (WELLBUTRIN XL) 150 MG 24 hr tablet Take 150 mg by mouth daily.   Coenzyme Q10 (COQ10) 100 MG CAPS Take by mouth daily.   LOSARTAN POTASSIUM PO Take 1 tablet by mouth daily.   mirtazapine (REMERON) 15 MG tablet TAKE 1 TABLET AT BEDTIME   Multiple Vitamin (MULTIVITAMIN) tablet Take 1 tablet by mouth daily.   oxyCODONE (OXY IR/ROXICODONE) 5 MG immediate release tablet Take 1 tablet (5 mg total) by mouth every 4 (four) hours as needed (severe pain). (Patient  not taking: Reported on 11/13/2021)   Phosphatidylserine 100 MG CAPS Take by mouth.   rosuvastatin (CRESTOR) 20 MG tablet Take 20 mg by mouth daily.   sertraline (ZOLOFT) 50 MG tablet TAKE 1 TABLET EVERY DAY   Turmeric (QC TUMERIC COMPLEX PO) Take 553 mg by mouth daily.   vitamin C (ASCORBIC ACID) 500 MG tablet Take 500 mg by mouth daily.   No current facility-administered medications for this visit. (Other)   REVIEW OF SYSTEMS: ROS   Positive for: Genitourinary, Eyes Negative for: Constitutional, Gastrointestinal, Neurological, Skin, Musculoskeletal, HENT, Endocrine, Cardiovascular, Respiratory, Psychiatric, Allergic/Imm, Heme/Lymph Last edited by Etheleen Mayhew, COT on 06/10/2023 10:09 AM.     ALLERGIES No Known Allergies  PAST MEDICAL HISTORY Past Medical History:  Diagnosis Date   Anxiety    Cataract    Mixed OU   Depression    Hyperlipidemia    Hypertension    Hypertensive retinopathy    OU   Left rotator cuff tear    Urine incontinence    Past Surgical History:  Procedure Laterality Date   FACIAL COSMETIC SURGERY     SHOULDER ARTHROSCOPY WITH SUBACROMIAL DECOMPRESSION, ROTATOR CUFF REPAIR AND BICEP TENDON REPAIR Left 11/17/2021   Procedure: LEFT SHOULDER ARTHROSCOPY WITH ROTATOR CUFF REPAIR AND BICEPS TENODESIS;  Surgeon: Huel Cote, MD;  Location: [facility];  Service: Orthopedics;  Laterality: Left;   TONSILLECTOMY  1957   tummy tuck     FAMILY HISTORY Family History  Problem Relation Age of Onset   Heart disease Mother    Hyperlipidemia Mother    Hypertension Mother    Stroke Mother    SOCIAL HISTORY Social History   Tobacco Use   Smoking status: Never   Smokeless tobacco: Never  Vaping Use   Vaping status: Never Used  Substance Use Topics   Alcohol use: Yes    Alcohol/week: 7.0 standard drinks of alcohol    Types: 7 Glasses of wine per week    Comment: social   Drug use: No       OPHTHALMIC EXAM:  Base Eye Exam      Visual Acuity (Snellen - Linear)       Right Left   Dist cc 20/25 -1 20/25   Dist ph cc NI NI         Tonometry (Tonopen, 10:17 AM)       Right Left   Pressure 14 15         Pupils       Pupils Dark Light Shape React APD   Right PERRL 3 2 Round Brisk None   Left PERRL 3 2 Round Brisk None         Visual Fields       Left Right    Full Full         Extraocular Movement       Right Left    Full, Ortho Full, Ortho         Neuro/Psych     Oriented x3: Yes   Mood/Affect: Normal         Dilation     Both eyes: 2.5% Phenylephrine @ 10:17 AM           Slit Lamp and Fundus Exam     Slit Lamp Exam       Right Left   Lids/Lashes Dermatochalasis - upper lid Dermatochalasis - upper lid   Conjunctiva/Sclera White and quiet White and quiet   Cornea 1+ Punctate epithelial erosions, early band K nasal and temporal 1+ Punctate epithelial erosions, early band K nasal and temporal   Anterior Chamber Deep and quiet Deep and quiet   Iris Round and dilated Round and dilated   Lens 2+ Nuclear sclerosis, 1-2+ Cortical cataract, focal Posterior subcapsular cataract at 0700 2+ Nuclear sclerosis, 2+ Cortical cataract, focal Posterior subcapsular cataract at 0700   Anterior Vitreous Vitreous syneresis, silicone oil micro bubbles Vitreous syneresis, silicone oil micro bubbles, Posterior vitreous detachment         Fundus Exam       Right Left   Disc Pink and Sharp, mild PPA Pink and Sharp   C/D Ratio 0.2 0.2   Macula Flat, Blunted foveal reflex, Drusen, RPE mottling, No heme, mild interval improvement in central cystic changes / edema inferior macula and fovea Flat, good foveal reflex, Drusen, RPE mottling and clumping, No heme or edema   Vessels attenuated, Tortuous attenuated, mild tortuosity   Periphery Attached, no heme Attached, mild mid zonal drusen, no heme              Refraction     Wearing Rx       Sphere Cylinder Axis Add   Right +0.25  +0.50 023 +2.50   Left +2.00 +0.25 030 +2.50           IMAGING AND PROCEDURES  Imaging and Procedures for 06/10/2023  OCT, Retina - OU - Both Eyes       Right Eye Quality was good. Central Foveal Thickness: 276. Progression has improved. Findings include normal foveal contour, no SRF, intraretinal fluid (Hx of BRVO, interval improvement in IRF/edema--just trace central cystic changes remain).   Left Eye Quality was good. Central Foveal Thickness: 290. Progression has been stable. Findings include normal foveal contour, no IRF, no SRF, retinal drusen .   Notes *Images captured and stored on drive  Diagnosis / Impression:  OD: Hx of BRVO, interval improvement in IRF/edema--just trace central cystic changes remain OS: NFP, no IRF/SRF, +drusen  Clinical management:  See below  Abbreviations: NFP - Normal foveal profile. CME - cystoid macular edema. PED - pigment epithelial detachment. IRF - intraretinal fluid. SRF - subretinal fluid. EZ - ellipsoid zone. ERM - epiretinal membrane. ORA - outer retinal atrophy. ORT - outer retinal tubulation. SRHM - subretinal hyper-reflective material. IRHM - intraretinal hyper-reflective material      Intravitreal Injection, Pharmacologic Agent - OD - Right Eye       Time Out 06/10/2023. 11:45 AM. Confirmed correct patient, procedure, site, and patient consented.   Anesthesia Topical anesthesia was used. Anesthetic medications included Lidocaine 2%, Proparacaine 0.5%.   Procedure Preparation included 5% betadine to ocular surface, eyelid speculum. A (32g) needle was used.   Injection: 2 mg aflibercept 2 MG/0.05ML   Route: Intravitreal, Site: Right Eye   NDC: L6038910, Lot: [id number], Expiration date: 11/26/2023, Waste: 0 mL   Post-op Post injection exam found visual acuity of at least counting fingers. The patient tolerated the procedure well. There were no complications. The patient received written and verbal post procedure care  education. Post injection medications were not given.            ASSESSMENT/PLAN:    ICD-10-CM   1. Branch retinal vein occlusion of right eye with macular edema  H34.8310 OCT, Retina - OU - Both Eyes    Intravitreal Injection, Pharmacologic Agent - OD - Right Eye    aflibercept (EYLEA) SOLN 2 mg    2. Essential hypertension  I10     3. Hypertensive retinopathy of both eyes  H35.033     4. Combined forms of age-related cataract of both eyes  H25.813      1. BRVO w/ CME OD  - delayed f/u -- 8 wks instead of 7 (07.25.24 to 09.20.24) due to [place] travel - delayed to follow up from 9 weeks to 13 weeks (11.01.23 - 01.31.24) -- worse edema - s/p IVA OD #1 (03.30.22), #2 (05.23.22), #3 (7.25.22), #4 (10.05.22), #5 (12.14.22), #6 (03.15.23), #7 (05.31.23), #8 (08.16.23), #9 (11.01.23), #10 (01.31.24), #11 (03.27.24), #12 (05.29.24), #13 (07.25.24)- IVA Resistance =============================================================== - IVE #1 (09.20.24), #2 (11.01.24) - hx of injections with Dr. Harless Litten in [place], [place] -- pt was receiving Lucentis q7-8 wks; pt reports history Avastin, but was switched to Lucentis at some point - last injection IVL OD was 02.04.22 (7.5 wks prior to initial visit here) - pt reports hx of worsening vision and OCT around 10 weeks - **worsening of IRF on OCT at 13 weeks on 01.31.24 and 03.15.23; 11 wks on 11.01.23; 8 wks on 07.25.24 and 09.20.24** - BCVA OD 20/25 - OCT today shows interval improvement in IRF/edema--just trace central cystic changes remain at 6 weeks - recommend IVE OD #3 today, 12.13.24 -- w/ f/u in 6 wks - RBA of procedure discussed, questions answered - IVE informed consent  obtained and signed, 09.20.24 (OD) - see procedure note  - approved for Eylea, but Good Days denied due to income restrictions - F/U 6 weeks -- DFE/OCT/possible injection  2,3. Hypertensive retinopathy OU - discussed importance of tight BP control - continue to  monitor  4. Mixed Cataract OU - The symptoms of cataract, surgical options, and treatments and risks were discussed with patient. - discussed diagnosis and progression - monitor for now   Ophthalmic Meds Ordered this visit:  Meds ordered this encounter  Medications   aflibercept (EYLEA) SOLN 2 mg     Return in about 6 weeks (around 07/22/2023) for BRVO OD, Dilated Exam, OCT, Possible Injxn.  There are no Patient Instructions on file for this visit.  This document serves as a record of services personally performed by Karie Chimera, MD, PhD. It was created on their behalf by Berlin Hun COT, an ophthalmic technician. The creation of this record is the provider's dictation and/or activities during the visit.    Electronically signed by: Berlin Hun COT 12.12.24 12:09 AM  This document serves as a record of services personally performed by Karie Chimera, MD, PhD. It was created on their behalf by Glee Arvin. Manson Passey, OA an ophthalmic technician. The creation of this record is the provider's dictation and/or activities during the visit.    Electronically signed by: Glee Arvin. Manson Passey, OA 06/11/23 12:09 AM  Karie Chimera, M.D., Ph.D. Diseases & Surgery of the Retina and Vitreous [facility] 06/10/2023   I have reviewed the above documentation for accuracy and completeness, and I agree with the above. Karie Chimera, M.D., Ph.D. 06/11/23 12:09 AM  Abbreviations: M myopia (nearsighted); A astigmatism; H hyperopia (farsighted); P presbyopia; Mrx spectacle prescription;  CTL contact lenses; OD right eye; OS left eye; OU both eyes  XT exotropia; ET esotropia; PEK punctate epithelial keratitis; PEE punctate epithelial erosions; DES dry eye syndrome; MGD meibomian gland dysfunction; ATs artificial tears; PFAT's preservative free artificial tears; NSC nuclear sclerotic cataract; PSC posterior subcapsular cataract; ERM epi-retinal membrane; PVD posterior  vitreous detachment; RD retinal detachment; DM diabetes mellitus; DR diabetic retinopathy; NPDR non-proliferative diabetic retinopathy; PDR proliferative diabetic retinopathy; CSME clinically significant macular edema; DME diabetic macular edema; dbh dot blot hemorrhages; CWS cotton wool spot; POAG primary open angle glaucoma; C/D cup-to-disc ratio; HVF humphrey visual field; GVF goldmann visual field; OCT optical coherence tomography; IOP intraocular pressure; BRVO Branch retinal vein occlusion; CRVO central retinal vein occlusion; CRAO central retinal artery occlusion; BRAO branch retinal artery occlusion; RT retinal tear; SB scleral buckle; PPV pars plana vitrectomy; VH Vitreous hemorrhage; PRP panretinal laser photocoagulation; IVK intravitreal kenalog; VMT vitreomacular traction; MH Macular hole;  NVD neovascularization of the disc; NVE neovascularization elsewhere; AREDS age related eye disease study; ARMD age related macular degeneration; POAG primary open angle glaucoma; EBMD epithelial/anterior basement membrane dystrophy; ACIOL anterior chamber intraocular lens; IOL intraocular lens; PCIOL posterior chamber intraocular lens; Phaco/IOL phacoemulsification with intraocular lens placement; PRK photorefractive keratectomy; LASIK laser assisted in situ keratomileusis; HTN hypertension; DM diabetes mellitus; COPD chronic obstructive pulmonary disease

## 2023-06-10 ENCOUNTER — Encounter (INDEPENDENT_AMBULATORY_CARE_PROVIDER_SITE_OTHER): Payer: Self-pay | Admitting: Ophthalmology

## 2023-06-10 ENCOUNTER — Ambulatory Visit (INDEPENDENT_AMBULATORY_CARE_PROVIDER_SITE_OTHER): Payer: Medicare PPO | Admitting: Ophthalmology

## 2023-06-10 DIAGNOSIS — I10 Essential (primary) hypertension: Secondary | ICD-10-CM | POA: Diagnosis not present

## 2023-06-10 DIAGNOSIS — H35033 Hypertensive retinopathy, bilateral: Secondary | ICD-10-CM | POA: Diagnosis not present

## 2023-06-10 DIAGNOSIS — H34831 Tributary (branch) retinal vein occlusion, right eye, with macular edema: Secondary | ICD-10-CM | POA: Diagnosis not present

## 2023-06-10 DIAGNOSIS — H25813 Combined forms of age-related cataract, bilateral: Secondary | ICD-10-CM

## 2023-06-10 MED ORDER — AFLIBERCEPT 2MG/0.05ML IZ SOLN FOR KALEIDOSCOPE
2.0000 mg | INTRAVITREAL | Status: AC | PRN
  Administered 2023-06-10: 2 mg via INTRAVITREAL

## 2023-07-05 DIAGNOSIS — L570 Actinic keratosis: Secondary | ICD-10-CM | POA: Diagnosis not present

## 2023-07-05 DIAGNOSIS — L814 Other melanin hyperpigmentation: Secondary | ICD-10-CM | POA: Diagnosis not present

## 2023-07-05 DIAGNOSIS — D2271 Melanocytic nevi of right lower limb, including hip: Secondary | ICD-10-CM | POA: Diagnosis not present

## 2023-07-05 DIAGNOSIS — D225 Melanocytic nevi of trunk: Secondary | ICD-10-CM | POA: Diagnosis not present

## 2023-07-05 DIAGNOSIS — L578 Other skin changes due to chronic exposure to nonionizing radiation: Secondary | ICD-10-CM | POA: Diagnosis not present

## 2023-07-05 DIAGNOSIS — L821 Other seborrheic keratosis: Secondary | ICD-10-CM | POA: Diagnosis not present

## 2023-07-05 DIAGNOSIS — D1801 Hemangioma of skin and subcutaneous tissue: Secondary | ICD-10-CM | POA: Diagnosis not present

## 2023-07-20 NOTE — Progress Notes (Signed)
[facility] - Clinic Note  07/21/2023    CHIEF COMPLAINT Patient presents for Retina Follow Up   HISTORY OF PRESENT ILLNESS: Emily Reid is a 78 y.o. female who presents to the clinic today for:  HPI     Retina Follow Up   Patient presents with  CRVO/BRVO.  In right eye.  This started 6 weeks ago.  Duration of 6 weeks.  Since onset it is stable.  I, the attending physician,  performed the HPI with the patient and updated documentation appropriately.        Comments   6 week retina follow up BRVO and I'VE OD pt is reporting no vision changes noticed she denies any flashes or floaters       Last edited by Rennis Chris, MD on 07/21/2023  2:54 PM.     Pt states vision is stable  Referring physician: Lois Huxley MD [address] [place], [place] [address]  HISTORICAL INFORMATION:   Selected notes from the MEDICAL RECORD NUMBER Referred by Dr. Harless Litten for continuation of eye injections OD for BRVO with edema LEE: 08/05/2020 Ocular Hx- BRVO, CME, iritis OD PMH- HTN   CURRENT MEDICATIONS: No current outpatient medications on file. (Ophthalmic Drugs)   No current facility-administered medications for this visit. (Ophthalmic Drugs)   Current Outpatient Medications (Other)  Medication Sig   amLODipine (NORVASC) 10 MG tablet Take 1 tablet (10 mg total) by mouth daily.   aspirin EC 325 MG tablet Take 1 tablet (325 mg total) by mouth daily. (Patient not taking: Reported on 11/13/2021)   buPROPion (WELLBUTRIN XL) 150 MG 24 hr tablet Take 150 mg by mouth daily.   Coenzyme Q10 (COQ10) 100 MG CAPS Take by mouth daily.   LOSARTAN POTASSIUM PO Take 1 tablet by mouth daily.   mirtazapine (REMERON) 15 MG tablet TAKE 1 TABLET AT BEDTIME   Multiple Vitamin (MULTIVITAMIN) tablet Take 1 tablet by mouth daily.   oxyCODONE (OXY IR/ROXICODONE) 5 MG immediate release tablet Take 1 tablet (5 mg total) by mouth every 4 (four) hours as needed (severe pain).  (Patient not taking: Reported on 11/13/2021)   Phosphatidylserine 100 MG CAPS Take by mouth.   rosuvastatin (CRESTOR) 20 MG tablet Take 20 mg by mouth daily.   sertraline (ZOLOFT) 50 MG tablet TAKE 1 TABLET EVERY DAY   Turmeric (QC TUMERIC COMPLEX PO) Take 553 mg by mouth daily.   vitamin C (ASCORBIC ACID) 500 MG tablet Take 500 mg by mouth daily.   No current facility-administered medications for this visit. (Other)   REVIEW OF SYSTEMS: ROS   Positive for: Genitourinary, Eyes Negative for: Constitutional, Gastrointestinal, Neurological, Skin, Musculoskeletal, HENT, Endocrine, Cardiovascular, Respiratory, Psychiatric, Allergic/Imm, Heme/Lymph Last edited by Etheleen Mayhew, COT on 07/21/2023  8:16 AM.      ALLERGIES No Known Allergies  PAST MEDICAL HISTORY Past Medical History:  Diagnosis Date   Anxiety    Cataract    Mixed OU   Depression    Hyperlipidemia    Hypertension    Hypertensive retinopathy    OU   Left rotator cuff tear    Urine incontinence    Past Surgical History:  Procedure Laterality Date   FACIAL COSMETIC SURGERY     SHOULDER ARTHROSCOPY WITH SUBACROMIAL DECOMPRESSION, ROTATOR CUFF REPAIR AND BICEP TENDON REPAIR Left 11/17/2021   Procedure: LEFT SHOULDER ARTHROSCOPY WITH ROTATOR CUFF REPAIR AND BICEPS TENODESIS;  Surgeon: Huel Cote, MD;  Location: [facility];  Service: Orthopedics;  Laterality: Left;   TONSILLECTOMY  1957   tummy tuck     FAMILY HISTORY Family History  Problem Relation Age of Onset   Heart disease Mother    Hyperlipidemia Mother    Hypertension Mother    Stroke Mother    SOCIAL HISTORY Social History   Tobacco Use   Smoking status: Never   Smokeless tobacco: Never  Vaping Use   Vaping status: Never Used  Substance Use Topics   Alcohol use: Yes    Alcohol/week: 7.0 standard drinks of alcohol    Types: 7 Glasses of wine per week    Comment: social   Drug use: No       OPHTHALMIC EXAM:  Base  Eye Exam     Visual Acuity (Snellen - Linear)       Right Left   Dist cc 20/25 -1 20/25   Dist ph cc NI NI    Correction: Glasses         Tonometry (Tonopen, 8:19 AM)       Right Left   Pressure 14 17         Pupils       Pupils Dark Light Shape React APD   Right PERRL 3 2 Round Brisk None   Left PERRL 3 2 Round Brisk None         Visual Fields       Left Right    Full Full         Extraocular Movement       Right Left    Full, Ortho Full, Ortho         Neuro/Psych     Oriented x3: Yes   Mood/Affect: Normal         Dilation     Both eyes: 2.5% Phenylephrine @ 8:19 AM           Slit Lamp and Fundus Exam     Slit Lamp Exam       Right Left   Lids/Lashes Dermatochalasis - upper lid Dermatochalasis - upper lid   Conjunctiva/Sclera White and quiet White and quiet   Cornea 1+ Punctate epithelial erosions, early band K nasal and temporal 1+ Punctate epithelial erosions, early band K nasal and temporal   Anterior Chamber Deep and quiet Deep and quiet   Iris Round and dilated Round and dilated   Lens 2+ Nuclear sclerosis, 1-2+ Cortical cataract, focal Posterior subcapsular cataract at 0700 2+ Nuclear sclerosis, 2+ Cortical cataract, focal Posterior subcapsular cataract at 0700   Anterior Vitreous Vitreous syneresis, silicone oil micro bubbles Vitreous syneresis, silicone oil micro bubbles, Posterior vitreous detachment         Fundus Exam       Right Left   Disc Pink and Sharp, mild PPA Pink and Sharp   C/D Ratio 0.2 0.2   Macula Flat, Blunted foveal reflex, Drusen, RPE mottling, No heme, mild stable improvement in central cystic changes / edema inferior macula and fovea Flat, good foveal reflex, Drusen, RPE mottling and clumping, No heme or edema   Vessels attenuated, mild tortuosity attenuated, mild tortuosity   Periphery Attached, no heme Attached, mild mid zonal drusen, no heme              Refraction     Wearing Rx        Sphere Cylinder Axis Add   Right +0.25 +0.50 023 +2.50   Left +2.00 +0.25 030 +2.50  IMAGING AND PROCEDURES  Imaging and Procedures for 07/21/2023  OCT, Retina - OU - Both Eyes       Right Eye Quality was good. Central Foveal Thickness: 272. Progression has been stable. Findings include normal foveal contour, no SRF, intraretinal fluid (Hx of BRVO, persistent trace central cystic changes remain).   Left Eye Quality was good. Central Foveal Thickness: 291. Progression has been stable. Findings include normal foveal contour, no IRF, no SRF, retinal drusen .   Notes *Images captured and stored on drive  Diagnosis / Impression:  OD: Hx of BRVO, tr persistent central cystic changes OS: NFP, no IRF/SRF, +drusen  Clinical management:  See below  Abbreviations: NFP - Normal foveal profile. CME - cystoid macular edema. PED - pigment epithelial detachment. IRF - intraretinal fluid. SRF - subretinal fluid. EZ - ellipsoid zone. ERM - epiretinal membrane. ORA - outer retinal atrophy. ORT - outer retinal tubulation. SRHM - subretinal hyper-reflective material. IRHM - intraretinal hyper-reflective material      Intravitreal Injection, Pharmacologic Agent - OD - Right Eye       Time Out 07/21/2023. 9:00 AM. Confirmed correct patient, procedure, site, and patient consented.   Anesthesia Topical anesthesia was used. Anesthetic medications included Lidocaine 2%, Proparacaine 0.5%.   Procedure Preparation included 5% betadine to ocular surface, eyelid speculum. A (32g) needle was used.   Injection: 2 mg aflibercept 2 MG/0.05ML   Route: Intravitreal, Site: Right Eye   NDC: L6038910, Lot: [id number], Expiration date: 10/25/2024, Waste: 0 mL   Post-op Post injection exam found visual acuity of at least counting fingers. The patient tolerated the procedure well. There were no complications. The patient received written and verbal post procedure care education. Post injection  medications were not given.            ASSESSMENT/PLAN:    ICD-10-CM   1. Branch retinal vein occlusion of right eye with macular edema  H34.8310 OCT, Retina - OU - Both Eyes    Intravitreal Injection, Pharmacologic Agent - OD - Right Eye    aflibercept (EYLEA) SOLN 2 mg    2. Essential hypertension  I10     3. Hypertensive retinopathy of both eyes  H35.033     4. Combined forms of age-related cataract of both eyes  H25.813       1. BRVO w/ CME OD  - delayed f/u -- 8 wks instead of 7 (07.25.24 to 09.20.24) due to [place] travel - delayed to follow up from 9 weeks to 13 weeks (11.01.23 - 01.31.24) -- worse edema - s/p IVA OD #1 (03.30.22), #2 (05.23.22), #3 (7.25.22), #4 (10.05.22), #5 (12.14.22), #6 (03.15.23), #7 (05.31.23), #8 (08.16.23), #9 (11.01.23), #10 (01.31.24), #11 (03.27.24), #12 (05.29.24), #13 (07.25.24)- IVA Resistance =============================================================== - IVE OD #1 (09.20.24), #2 (11.01.24), #3 (12.13.24) - hx of injections with Dr. Harless Litten in [place], [place] -- pt was receiving Lucentis q7-8 wks; pt reports history Avastin, but was switched to Lucentis at some point - last injection IVL OD was 02.04.22 (7.5 wks prior to initial visit here) - pt reports hx of worsening vision and OCT around 10 weeks - **worsening of IRF on OCT at 13 weeks on 01.31.24 and 03.15.23; 11 wks on 11.01.23; 8 wks on 07.25.24 and 09.20.24** - BCVA OD 20/25 - OCT today shows tr persistent central cystic changes at 6 weeks - recommend IVE OD #4 today, 01.23.25 -- w/ f/u in 6 wks - RBA of procedure discussed, questions answered - IVE informed consent obtained and  signed, 09.20.24 (OD) - see procedure note  - approved for Eylea, but Good Days denied due to income restrictions - F/U 6 weeks -- DFE/OCT/possible injection  2,3. Hypertensive retinopathy OU - discussed importance of tight BP control - continue to monitor  4. Mixed Cataract OU - The symptoms of  cataract, surgical options, and treatments and risks were discussed with patient. - discussed diagnosis and progression - monitor for now  Ophthalmic Meds Ordered this visit:  Meds ordered this encounter  Medications   aflibercept (EYLEA) SOLN 2 mg     Return in about 6 weeks (around 09/01/2023) for f/u BRVO OD, DFE, OCT, Possible Injxn.  There are no Patient Instructions on file for this visit.  This document serves as a record of services personally performed by Karie Chimera, MD, PhD. It was created on their behalf by Annalee Genta, COMT. The creation of this record is the provider's dictation and/or activities during the visit.  Electronically signed by: Annalee Genta, COMT 07/21/23 5:59 PM  This document serves as a record of services personally performed by Karie Chimera, MD, PhD. It was created on their behalf by Glee Arvin. Manson Passey, OA an ophthalmic technician. The creation of this record is the provider's dictation and/or activities during the visit.    Electronically signed by: Glee Arvin. Manson Passey, OA 07/21/23 5:59 PM  Karie Chimera, M.D., Ph.D. Diseases & Surgery of the Retina and Vitreous [facility] 07/21/2023   I have reviewed the above documentation for accuracy and completeness, and I agree with the above. Karie Chimera, M.D., Ph.D. 07/21/23 5:59 PM   Abbreviations: M myopia (nearsighted); A astigmatism; H hyperopia (farsighted); P presbyopia; Mrx spectacle prescription;  CTL contact lenses; OD right eye; OS left eye; OU both eyes  XT exotropia; ET esotropia; PEK punctate epithelial keratitis; PEE punctate epithelial erosions; DES dry eye syndrome; MGD meibomian gland dysfunction; ATs artificial tears; PFAT's preservative free artificial tears; NSC nuclear sclerotic cataract; PSC posterior subcapsular cataract; ERM epi-retinal membrane; PVD posterior vitreous detachment; RD retinal detachment; DM diabetes mellitus; DR diabetic retinopathy; NPDR  non-proliferative diabetic retinopathy; PDR proliferative diabetic retinopathy; CSME clinically significant macular edema; DME diabetic macular edema; dbh dot blot hemorrhages; CWS cotton wool spot; POAG primary open angle glaucoma; C/D cup-to-disc ratio; HVF humphrey visual field; GVF goldmann visual field; OCT optical coherence tomography; IOP intraocular pressure; BRVO Branch retinal vein occlusion; CRVO central retinal vein occlusion; CRAO central retinal artery occlusion; BRAO branch retinal artery occlusion; RT retinal tear; SB scleral buckle; PPV pars plana vitrectomy; VH Vitreous hemorrhage; PRP panretinal laser photocoagulation; IVK intravitreal kenalog; VMT vitreomacular traction; MH Macular hole;  NVD neovascularization of the disc; NVE neovascularization elsewhere; AREDS age related eye disease study; ARMD age related macular degeneration; POAG primary open angle glaucoma; EBMD epithelial/anterior basement membrane dystrophy; ACIOL anterior chamber intraocular lens; IOL intraocular lens; PCIOL posterior chamber intraocular lens; Phaco/IOL phacoemulsification with intraocular lens placement; PRK photorefractive keratectomy; LASIK laser assisted in situ keratomileusis; HTN hypertension; DM diabetes mellitus; COPD chronic obstructive pulmonary disease

## 2023-07-21 ENCOUNTER — Encounter (INDEPENDENT_AMBULATORY_CARE_PROVIDER_SITE_OTHER): Payer: Medicare PPO | Admitting: Ophthalmology

## 2023-07-21 ENCOUNTER — Encounter (INDEPENDENT_AMBULATORY_CARE_PROVIDER_SITE_OTHER): Payer: Self-pay | Admitting: Ophthalmology

## 2023-07-21 ENCOUNTER — Ambulatory Visit (INDEPENDENT_AMBULATORY_CARE_PROVIDER_SITE_OTHER): Payer: Medicare PPO | Admitting: Ophthalmology

## 2023-07-21 DIAGNOSIS — I10 Essential (primary) hypertension: Secondary | ICD-10-CM | POA: Diagnosis not present

## 2023-07-21 DIAGNOSIS — H25813 Combined forms of age-related cataract, bilateral: Secondary | ICD-10-CM | POA: Diagnosis not present

## 2023-07-21 DIAGNOSIS — H34831 Tributary (branch) retinal vein occlusion, right eye, with macular edema: Secondary | ICD-10-CM

## 2023-07-21 DIAGNOSIS — H35033 Hypertensive retinopathy, bilateral: Secondary | ICD-10-CM | POA: Diagnosis not present

## 2023-07-21 MED ORDER — AFLIBERCEPT 2MG/0.05ML IZ SOLN FOR KALEIDOSCOPE
2.0000 mg | INTRAVITREAL | Status: AC | PRN
  Administered 2023-07-21: 2 mg via INTRAVITREAL

## 2023-07-25 DIAGNOSIS — I10 Essential (primary) hypertension: Secondary | ICD-10-CM | POA: Diagnosis not present

## 2023-07-25 DIAGNOSIS — L729 Follicular cyst of the skin and subcutaneous tissue, unspecified: Secondary | ICD-10-CM | POA: Diagnosis not present

## 2023-08-10 ENCOUNTER — Encounter (INDEPENDENT_AMBULATORY_CARE_PROVIDER_SITE_OTHER): Payer: Medicare PPO | Admitting: Ophthalmology

## 2023-08-30 NOTE — Progress Notes (Signed)
 [facility] - Clinic Note  09/07/2023    CHIEF COMPLAINT Patient presents for Retina Follow Up   HISTORY OF PRESENT ILLNESS: Emily Reid is a 78 y.o. female who presents to the clinic today for:  HPI     Retina Follow Up   Patient presents with  CRVO/BRVO.  In right eye.  This started 7 weeks ago.  I, the attending physician,  performed the HPI with the patient and updated documentation appropriately.        Comments   Patient here for 7 weeks retina follow up for BRVO OD. Patient states vision is generally getting worse. Especially far vision.  No eye pain. Has new glasses.       Last edited by Rennis Chris, MD on 09/07/2023 12:36 PM.    Pt states her distance vision overall seems to be worse, she saw her optometrist recently and he told her she had cataracts  Referring physician: Lois Huxley MD [address] [place], [place] [address]  HISTORICAL INFORMATION:   Selected notes from the MEDICAL RECORD NUMBER Referred by Dr. Harless Litten for continuation of eye injections OD for BRVO with edema LEE: 08/05/2020 Ocular Hx- BRVO, CME, iritis OD PMH- HTN   CURRENT MEDICATIONS: No current outpatient medications on file. (Ophthalmic Drugs)   No current facility-administered medications for this visit. (Ophthalmic Drugs)   Current Outpatient Medications (Other)  Medication Sig   amLODipine (NORVASC) 10 MG tablet Take 1 tablet (10 mg total) by mouth daily.   Coenzyme Q10 (COQ10) 100 MG CAPS Take by mouth daily.   LOSARTAN POTASSIUM PO Take 1 tablet by mouth daily.   mirtazapine (REMERON) 15 MG tablet TAKE 1 TABLET AT BEDTIME   Multiple Vitamin (MULTIVITAMIN) tablet Take 1 tablet by mouth daily.   Phosphatidylserine 100 MG CAPS Take by mouth.   rosuvastatin (CRESTOR) 20 MG tablet Take 20 mg by mouth daily.   sertraline (ZOLOFT) 50 MG tablet TAKE 1 TABLET EVERY DAY   Turmeric (QC TUMERIC COMPLEX PO) Take 553 mg by mouth daily.   vitamin C  (ASCORBIC ACID) 500 MG tablet Take 500 mg by mouth daily.   aspirin EC 325 MG tablet Take 1 tablet (325 mg total) by mouth daily. (Patient not taking: Reported on 11/13/2021)   buPROPion (WELLBUTRIN XL) 150 MG 24 hr tablet Take 150 mg by mouth daily.   oxyCODONE (OXY IR/ROXICODONE) 5 MG immediate release tablet Take 1 tablet (5 mg total) by mouth every 4 (four) hours as needed (severe pain). (Patient not taking: Reported on 11/13/2021)   No current facility-administered medications for this visit. (Other)   REVIEW OF SYSTEMS: ROS   Positive for: Genitourinary, Eyes Negative for: Constitutional, Gastrointestinal, Neurological, Skin, Musculoskeletal, HENT, Endocrine, Cardiovascular, Respiratory, Psychiatric, Allergic/Imm, Heme/Lymph Last edited by Laddie Aquas, COA on 09/07/2023  9:57 AM.     ALLERGIES No Known Allergies  PAST MEDICAL HISTORY Past Medical History:  Diagnosis Date   Anxiety    Cataract    Mixed OU   Depression    Hyperlipidemia    Hypertension    Hypertensive retinopathy    OU   Left rotator cuff tear    Urine incontinence    Past Surgical History:  Procedure Laterality Date   FACIAL COSMETIC SURGERY     SHOULDER ARTHROSCOPY WITH SUBACROMIAL DECOMPRESSION, ROTATOR CUFF REPAIR AND BICEP TENDON REPAIR Left 11/17/2021   Procedure: LEFT SHOULDER ARTHROSCOPY WITH ROTATOR CUFF REPAIR AND BICEPS TENODESIS;  Surgeon:  Huel Cote, MD;  Location: [facility];  Service: Orthopedics;  Laterality: Left;   TONSILLECTOMY  1957   tummy tuck     FAMILY HISTORY Family History  Problem Relation Age of Onset   Heart disease Mother    Hyperlipidemia Mother    Hypertension Mother    Stroke Mother    SOCIAL HISTORY Social History   Tobacco Use   Smoking status: Never   Smokeless tobacco: Never  Vaping Use   Vaping status: Never Used  Substance Use Topics   Alcohol use: Yes    Alcohol/week: 7.0 standard drinks of alcohol    Types: 7 Glasses of wine  per week    Comment: social   Drug use: No       OPHTHALMIC EXAM:  Base Eye Exam     Visual Acuity (Snellen - Linear)       Right Left   Dist cc 20/25 20/25 -1    Correction: Glasses  Has new glasses.        Tonometry (Tonopen, 9:53 AM)       Right Left   Pressure 15 16         Pupils       Dark Light Shape React APD   Right 3 2 Round Brisk None   Left 3 2 Round Brisk None         Visual Fields (Counting fingers)       Left Right    Full Full         Extraocular Movement       Right Left    Full, Ortho Full, Ortho         Neuro/Psych     Oriented x3: Yes   Mood/Affect: Normal         Dilation     Both eyes: 1.0% Mydriacyl, 2.5% Phenylephrine @ 9:53 AM           Slit Lamp and Fundus Exam     Slit Lamp Exam       Right Left   Lids/Lashes Normal Normal   Conjunctiva/Sclera White and quiet temporal pinguecula   Cornea 2+ fine Punctate epithelial erosions 1-2+ fine Punctate epithelial erosions   Anterior Chamber deep and clear deep and clear   Iris Round and dilated Round and dilated   Lens 2+ Nuclear sclerosis, 2+ Cortical cataract, focal Posterior subcapsular cataract at 0600 2+ Nuclear sclerosis, 2+ Cortical cataract, focal Posterior subcapsular cataract at 0700   Anterior Vitreous Vitreous syneresis, silicone oil micro bubbles Vitreous syneresis, silicone oil micro bubbles, Posterior vitreous detachment         Fundus Exam       Right Left   Disc Pink and Sharp, mild PPA Pink and Sharp   C/D Ratio 0.2 0.2   Macula Flat, Blunted foveal reflex, Drusen, RPE mottling, No heme, stable improvement in central cystic changes / edema inferior macula and fovea Flat, good foveal reflex, Drusen, RPE mottling and clumping, No heme or edema   Vessels attenuated, mild tortuosity attenuated, mild tortuosity   Periphery Attached, no heme Attached, mild mid zonal drusen, no heme              Refraction     Wearing Rx       Sphere  Cylinder Axis Add   Right +0.75 +1.25 179 +2.75   Left +1.75 +0.50 005 +2.75           IMAGING AND PROCEDURES  Imaging and  Procedures for 09/07/2023  OCT, Retina - OU - Both Eyes       Right Eye Quality was good. Central Foveal Thickness: 271. Progression has been stable. Findings include normal foveal contour, no SRF, intraretinal fluid (Hx of BRVO, stable improvement in trace central cystic changes).   Left Eye Quality was good. Central Foveal Thickness: 292. Progression has been stable. Findings include normal foveal contour, no IRF, no SRF, retinal drusen .   Notes *Images captured and stored on drive  Diagnosis / Impression:  OD: Hx of BRVO, stable improvement in trace central cystic changes OS: NFP, no IRF/SRF, +drusen  Clinical management:  See below  Abbreviations: NFP - Normal foveal profile. CME - cystoid macular edema. PED - pigment epithelial detachment. IRF - intraretinal fluid. SRF - subretinal fluid. EZ - ellipsoid zone. ERM - epiretinal membrane. ORA - outer retinal atrophy. ORT - outer retinal tubulation. SRHM - subretinal hyper-reflective material. IRHM - intraretinal hyper-reflective material      Intravitreal Injection, Pharmacologic Agent - OD - Right Eye       Time Out 09/07/2023. 11:03 AM. Confirmed correct patient, procedure, site, and patient consented.   Anesthesia Topical anesthesia was used. Anesthetic medications included Lidocaine 2%, Proparacaine 0.5%.   Procedure Preparation included 5% betadine to ocular surface, eyelid speculum. A (32g) needle was used.   Injection: 2 mg aflibercept 2 MG/0.05ML   Route: Intravitreal, Site: Right Eye   NDC: L6038910, Lot: [id number], Expiration date: 11/24/2024, Waste: 0 mL   Post-op Post injection exam found visual acuity of at least counting fingers. The patient tolerated the procedure well. There were no complications. The patient received written and verbal post procedure care education. Post  injection medications were not given.            ASSESSMENT/PLAN:    ICD-10-CM   1. Branch retinal vein occlusion of right eye with macular edema  H34.8310 OCT, Retina - OU - Both Eyes    Intravitreal Injection, Pharmacologic Agent - OD - Right Eye    aflibercept (EYLEA) SOLN 2 mg    2. Essential hypertension  I10     3. Hypertensive retinopathy of both eyes  H35.033     4. Combined forms of age-related cataract of both eyes  H25.813      1. BRVO w/ CME OD  - delayed f/u -- 8 wks instead of 7 (07.25.24 to 09.20.24) due to [place] travel - delayed to follow up from 9 weeks to 13 weeks (11.01.23 - 01.31.24) -- worse edema - s/p IVA OD #1 (03.30.22), #2 (05.23.22), #3 (7.25.22), #4 (10.05.22), #5 (12.14.22), #6 (03.15.23), #7 (05.31.23), #8 (08.16.23), #9 (11.01.23), #10 (01.31.24), #11 (03.27.24), #12 (05.29.24), #13 (07.25.24) -- IVA Resistance =============================================================== - s/p IVE OD #1 (09.20.24), #2 (11.01.24), #3 (12.13.24), #4 (01.23.25) - hx of injections with Dr. Harless Litten in [place], [place] -- pt was receiving Lucentis q7-8 wks; pt reports history Avastin, but was switched to Lucentis at some point - last injection IVL OD was 02.04.22 (7.5 wks prior to initial visit here) - pt reports hx of worsening vision and OCT around 10 weeks - **worsening of IRF on OCT at 13 weeks on 01.31.24 and 03.15.23; 11 wks on 11.01.23; 8 wks on 07.25.24 and 09.20.24** - BCVA OD 20/25 - stable - OCT today shows stable improvement in trace central cystic changes at 6.9 weeks - recommend IVE OD #5 today, 03.12.25 -- w/ f/u extended to 8 wks - RBA of procedure discussed, questions answered -  IVE informed consent obtained and signed, 09.20.24 (OD) - see procedure note  - approved for Eylea, but Good Days denied due to income restrictions -- pt covering 20% coinsurance - F/U 8 weeks -- DFE/OCT/possible injection  2,3. Hypertensive retinopathy OU - discussed  importance of tight BP control - continue to monitor  4. Mixed Cataract OU - The symptoms of cataract, surgical options, and treatments and risks were discussed with patient. - discussed diagnosis and progression - monitor for now  Ophthalmic Meds Ordered this visit:  Meds ordered this encounter  Medications   aflibercept (EYLEA) SOLN 2 mg     Return in about 8 weeks (around 11/02/2023) for f/u BRVO OD, DFE, OCT, Possible Injxn.  There are no Patient Instructions on file for this visit.  This document serves as a record of services personally performed by Karie Chimera, MD, PhD. It was created on their behalf by Glee Arvin. Manson Passey, OA an ophthalmic technician. The creation of this record is the provider's dictation and/or activities during the visit.    Electronically signed by: Glee Arvin. Kristopher Oppenheim 09/07/23 12:38 PM  Karie Chimera, M.D., Ph.D. Diseases & Surgery of the Retina and Vitreous [facility] 09/07/2023   I have reviewed the above documentation for accuracy and completeness, and I agree with the above. Karie Chimera, M.D., Ph.D. 09/07/23 12:39 PM   Abbreviations: M myopia (nearsighted); A astigmatism; H hyperopia (farsighted); P presbyopia; Mrx spectacle prescription;  CTL contact lenses; OD right eye; OS left eye; OU both eyes  XT exotropia; ET esotropia; PEK punctate epithelial keratitis; PEE punctate epithelial erosions; DES dry eye syndrome; MGD meibomian gland dysfunction; ATs artificial tears; PFAT's preservative free artificial tears; NSC nuclear sclerotic cataract; PSC posterior subcapsular cataract; ERM epi-retinal membrane; PVD posterior vitreous detachment; RD retinal detachment; DM diabetes mellitus; DR diabetic retinopathy; NPDR non-proliferative diabetic retinopathy; PDR proliferative diabetic retinopathy; CSME clinically significant macular edema; DME diabetic macular edema; dbh dot blot hemorrhages; CWS cotton wool spot; POAG primary open  angle glaucoma; C/D cup-to-disc ratio; HVF humphrey visual field; GVF goldmann visual field; OCT optical coherence tomography; IOP intraocular pressure; BRVO Branch retinal vein occlusion; CRVO central retinal vein occlusion; CRAO central retinal artery occlusion; BRAO branch retinal artery occlusion; RT retinal tear; SB scleral buckle; PPV pars plana vitrectomy; VH Vitreous hemorrhage; PRP panretinal laser photocoagulation; IVK intravitreal kenalog; VMT vitreomacular traction; MH Macular hole;  NVD neovascularization of the disc; NVE neovascularization elsewhere; AREDS age related eye disease study; ARMD age related macular degeneration; POAG primary open angle glaucoma; EBMD epithelial/anterior basement membrane dystrophy; ACIOL anterior chamber intraocular lens; IOL intraocular lens; PCIOL posterior chamber intraocular lens; Phaco/IOL phacoemulsification with intraocular lens placement; PRK photorefractive keratectomy; LASIK laser assisted in situ keratomileusis; HTN hypertension; DM diabetes mellitus; COPD chronic obstructive pulmonary disease

## 2023-09-07 ENCOUNTER — Ambulatory Visit (INDEPENDENT_AMBULATORY_CARE_PROVIDER_SITE_OTHER): Payer: Medicare PPO | Admitting: Ophthalmology

## 2023-09-07 ENCOUNTER — Encounter (INDEPENDENT_AMBULATORY_CARE_PROVIDER_SITE_OTHER): Payer: Self-pay | Admitting: Ophthalmology

## 2023-09-07 DIAGNOSIS — H25813 Combined forms of age-related cataract, bilateral: Secondary | ICD-10-CM | POA: Diagnosis not present

## 2023-09-07 DIAGNOSIS — I10 Essential (primary) hypertension: Secondary | ICD-10-CM

## 2023-09-07 DIAGNOSIS — H35033 Hypertensive retinopathy, bilateral: Secondary | ICD-10-CM | POA: Diagnosis not present

## 2023-09-07 DIAGNOSIS — H34831 Tributary (branch) retinal vein occlusion, right eye, with macular edema: Secondary | ICD-10-CM | POA: Diagnosis not present

## 2023-09-07 MED ORDER — AFLIBERCEPT 2MG/0.05ML IZ SOLN FOR KALEIDOSCOPE
2.0000 mg | INTRAVITREAL | Status: AC | PRN
  Administered 2023-09-07: 2 mg via INTRAVITREAL

## 2023-10-21 NOTE — Progress Notes (Signed)
 [facility] - Clinic Note  10/31/2023    CHIEF COMPLAINT Patient presents for Retina Follow Up   HISTORY OF PRESENT ILLNESS: Emily Reid is a 78 y.o. female who presents to the clinic today for:  HPI     Retina Follow Up   Patient presents with  CRVO/BRVO.  In right eye.  This started 3 years ago.  Duration of 8 weeks.  Since onset it is stable.  I, the attending physician,  performed the HPI with the patient and updated documentation appropriately.        Comments   Pt presents for 7 weeks retina follow up, BRVO OD. Patient states vision is generally getting decreasing but about the same as last time. Pt denies FOL/floaters/discomfort. Pt is using Systane  BID OU.       Last edited by Amisha Pospisil, MD on 10/31/2023  6:42 PM.     Pt states her vision is "not great", she is having a hard time driving, she feels like she can drive better with her glasses off, she is interested in having cataract sx  Referring physician: Ennis Hart MD [address] [place], [place] [address]  HISTORICAL INFORMATION:   Selected notes from the MEDICAL RECORD NUMBER Referred by Dr. Thomasena Fleming for continuation of eye injections OD for BRVO with edema LEE: 08/05/2020 Ocular Hx- BRVO, CME, iritis OD PMH- HTN   CURRENT MEDICATIONS: No current outpatient medications on file. (Ophthalmic Drugs)   No current facility-administered medications for this visit. (Ophthalmic Drugs)   Current Outpatient Medications (Other)  Medication Sig   amLODipine  (NORVASC ) 10 MG tablet Take 1 tablet (10 mg total) by mouth daily.   Coenzyme Q10 (COQ10) 100 MG CAPS Take by mouth daily.   LOSARTAN POTASSIUM PO Take 1 tablet by mouth daily.   mirtazapine  (REMERON ) 15 MG tablet TAKE 1 TABLET AT BEDTIME   Multiple Vitamin (MULTIVITAMIN) tablet Take 1 tablet by mouth daily.   Phosphatidylserine 100 MG CAPS Take by mouth.   rosuvastatin  (CRESTOR ) 20 MG tablet Take 20 mg by mouth daily.    sertraline  (ZOLOFT ) 50 MG tablet TAKE 1 TABLET EVERY DAY   Turmeric (QC TUMERIC COMPLEX PO) Take 553 mg by mouth daily.   vitamin C (ASCORBIC ACID) 500 MG tablet Take 500 mg by mouth daily.   aspirin  EC 325 MG tablet Take 1 tablet (325 mg total) by mouth daily. (Patient not taking: Reported on 11/13/2021)   buPROPion (WELLBUTRIN XL) 150 MG 24 hr tablet Take 150 mg by mouth daily.   oxyCODONE  (OXY IR/ROXICODONE ) 5 MG immediate release tablet Take 1 tablet (5 mg total) by mouth every 4 (four) hours as needed (severe pain). (Patient not taking: Reported on 11/13/2021)   No current facility-administered medications for this visit. (Other)   REVIEW OF SYSTEMS: ROS   Positive for: Genitourinary, Eyes Negative for: Constitutional, Gastrointestinal, Neurological, Skin, Musculoskeletal, HENT, Endocrine, Cardiovascular, Respiratory, Psychiatric, Allergic/Imm, Heme/Lymph Last edited by Carrington Clack, COT on 10/31/2023  1:16 PM.      ALLERGIES No Known Allergies  PAST MEDICAL HISTORY Past Medical History:  Diagnosis Date   Anxiety    Cataract    Mixed OU   Depression    Hyperlipidemia    Hypertension    Hypertensive retinopathy    OU   Left rotator cuff tear    Urine incontinence    Past Surgical History:  Procedure Laterality Date   FACIAL COSMETIC SURGERY  SHOULDER ARTHROSCOPY WITH SUBACROMIAL DECOMPRESSION, ROTATOR CUFF REPAIR AND BICEP TENDON REPAIR Left 11/17/2021   Procedure: LEFT SHOULDER ARTHROSCOPY WITH ROTATOR CUFF REPAIR AND BICEPS TENODESIS;  Surgeon: Wilhelmenia Harada, MD;  Location: [facility];  Service: Orthopedics;  Laterality: Left;   TONSILLECTOMY  1957   tummy tuck     FAMILY HISTORY Family History  Problem Relation Age of Onset   Heart disease Mother    Hyperlipidemia Mother    Hypertension Mother    Stroke Mother    SOCIAL HISTORY Social History   Tobacco Use   Smoking status: Never   Smokeless tobacco: Never  Vaping Use   Vaping status:  Never Used  Substance Use Topics   Alcohol  use: Yes    Alcohol /week: 7.0 standard drinks of alcohol     Types: 7 Glasses of wine per week    Comment: social   Drug use: No       OPHTHALMIC EXAM:  Base Eye Exam     Visual Acuity (Snellen - Linear)       Right Left   Dist cc 20/25 -2 20/25 -1    Correction: Glasses         Tonometry (Tonopen, 1:11 PM)       Right Left   Pressure 20 20         Pupils       Pupils Dark Light Shape React APD   Right PERRL 3 2 Round Brisk None   Left PERRL 3 2 Round Brisk None         Visual Fields       Left Right    Full Full         Extraocular Movement       Right Left    Full, Ortho Full, Ortho         Neuro/Psych     Oriented x3: Yes   Mood/Affect: Normal         Dilation     Both eyes: 1.0% Mydriacyl, 2.5% Phenylephrine  @ 1:12 PM           Slit Lamp and Fundus Exam     Slit Lamp Exam       Right Left   Lids/Lashes Normal Normal   Conjunctiva/Sclera White and quiet temporal pinguecula   Cornea 1+ inferior Punctate epithelial erosions 1+ inferior Punctate epithelial erosions   Anterior Chamber deep and clear deep and clear   Iris Round and dilated Round and dilated   Lens 2+ Nuclear sclerosis, 2+ Cortical cataract, focal Posterior subcapsular cataract at 0600 2+ Nuclear sclerosis, 2+ Cortical cataract, focal Posterior subcapsular cataract at 0700   Anterior Vitreous Vitreous syneresis, silicone oil micro bubbles Vitreous syneresis, silicone oil micro bubbles, Posterior vitreous detachment         Fundus Exam       Right Left   Disc Pink and Sharp, mild PPA Pink and Sharp   C/D Ratio 0.2 0.2   Macula Flat, Blunted foveal reflex, Drusen, RPE mottling, No heme, stable improvement in central cystic changes / edema inferior macula and fovea Flat, good foveal reflex, Drusen, RPE mottling and clumping, No heme or edema   Vessels attenuated, mild tortuosity attenuated, mild tortuosity   Periphery  Attached, no heme Attached, mild mid zonal drusen, no heme              Refraction     Wearing Rx       Sphere Cylinder Axis Add  Right +0.75 +1.25 179 +2.75   Left +1.75 +0.50 005 +2.75           IMAGING AND PROCEDURES  Imaging and Procedures for 10/31/2023  OCT, Retina - OU - Both Eyes       Right Eye Quality was good. Central Foveal Thickness: 277. Progression has been stable. Findings include normal foveal contour, no IRF, no SRF (Hx of BRVO, stable improvement in trace central cystic changes).   Left Eye Quality was good. Central Foveal Thickness: 297. Progression has been stable. Findings include normal foveal contour, no IRF, no SRF, retinal drusen .   Notes *Images captured and stored on drive  Diagnosis / Impression:  OD: Hx of BRVO, stable improvement in trace central cystic changes OS: NFP, no IRF/SRF, +drusen  Clinical management:  See below  Abbreviations: NFP - Normal foveal profile. CME - cystoid macular edema. PED - pigment epithelial detachment. IRF - intraretinal fluid. SRF - subretinal fluid. EZ - ellipsoid zone. ERM - epiretinal membrane. ORA - outer retinal atrophy. ORT - outer retinal tubulation. SRHM - subretinal hyper-reflective material. IRHM - intraretinal hyper-reflective material      Intravitreal Injection, Pharmacologic Agent - OD - Right Eye       Time Out 10/31/2023. 1:59 PM. Confirmed correct patient, procedure, site, and patient consented.   Anesthesia Topical anesthesia was used. Anesthetic medications included Lidocaine  2%, Proparacaine 0.5%.   Procedure Preparation included 5% betadine to ocular surface, eyelid speculum. A (32g) needle was used.   Injection: 2 mg aflibercept  2 MG/0.05ML   Route: Intravitreal, Site: Right Eye   NDC: Q956576, Lot: [id number], Expiration date: 10/25/2024, Waste: 0 mL   Post-op Post injection exam found visual acuity of at least counting fingers. The patient tolerated the procedure  well. There were no complications. The patient received written and verbal post procedure care education. Post injection medications were not given.            ASSESSMENT/PLAN:    ICD-10-CM   1. Branch retinal vein occlusion of right eye with macular edema  H34.8310 OCT, Retina - OU - Both Eyes    Intravitreal Injection, Pharmacologic Agent - OD - Right Eye    aflibercept  (EYLEA ) SOLN 2 mg    2. Essential hypertension  I10     3. Hypertensive retinopathy of both eyes  H35.033     4. Combined forms of age-related cataract of both eyes  H25.813      1. BRVO w/ CME OD  - delayed f/u -- 8 wks instead of 7 (07.25.24 to 09.20.24) due to [place] travel - delayed to follow up from 9 weeks to 13 weeks (11.01.23 - 01.31.24) -- worse edema - s/p IVA OD #1 (03.30.22), #2 (05.23.22), #3 (7.25.22), #4 (10.05.22), #5 (12.14.22), #6 (03.15.23), #7 (05.31.23), #8 (08.16.23), #9 (11.01.23), #10 (01.31.24), #11 (03.27.24), #12 (05.29.24), #13 (07.25.24) -- IVA Resistance =========================================================== - s/p IVE OD #1 (09.20.24), #2 (11.01.24), #3 (12.13.24), #4 (01.23.25), #5 (03.12.25) - hx of injections with Dr. Thomasena Fleming in [place], [place] -- pt was receiving Lucentis q7-8 wks; pt reports history Avastin , but was switched to Lucentis at some point - last injection IVL OD was 02.04.22 (7.5 wks prior to initial visit here) **h/o worsening IRF at 13 weeks on 01.31.24 and 03.15.23; 11 wks on 11.01.23; 8 wks on 07.25.24 and 09.20.24** - BCVA OD 20/25 - stable - OCT today shows stable improvement in trace central cystic changes at 7+ weeks - recommend IVE OD #6 today, 05.05.25 --  w/ f/u extended to 8-9 wks - RBA of procedure discussed, questions answered - IVE informed consent obtained and signed, 09.20.24 (OD) - see procedure note  - approved for Eylea , but [employer] denied due to income restrictions -- pt covering 20% coinsurance - f/u 8-9 wks -- DFE, OCT, possible  injection  2,3. Hypertensive retinopathy OU - discussed importance of tight BP control - monitor  4. Mixed Cataract OU - The symptoms of cataract, surgical options, and treatments and risks were discussed with patient. - discussed diagnosis and progression - monitor  Ophthalmic Meds Ordered this visit:  Meds ordered this encounter  Medications   aflibercept  (EYLEA ) SOLN 2 mg     Return for f/u 8-9 weeks, BRVO OD, DFE, OCT, Possible Injxn.  There are no Patient Instructions on file for this visit.  This document serves as a record of services personally performed by Jeanice Millard, MD, PhD. It was created on their behalf by Morley Arabia. Bevin Bucks, OA an ophthalmic technician. The creation of this record is the provider's dictation and/or activities during the visit.    Electronically signed by: Morley Arabia. Bevin Bucks, OA 10/31/23 10:43 PM  Jeanice Millard, M.D., Ph.D. Diseases & Surgery of the Retina and Vitreous [facility]  I have reviewed the above documentation for accuracy and completeness, and I agree with the above. Jeanice Millard, M.D., Ph.D. 10/31/23 10:48 PM    Abbreviations: M myopia (nearsighted); A astigmatism; H hyperopia (farsighted); P presbyopia; Mrx spectacle prescription;  CTL contact lenses; OD right eye; OS left eye; OU both eyes  XT exotropia; ET esotropia; PEK punctate epithelial keratitis; PEE punctate epithelial erosions; DES dry eye syndrome; MGD meibomian gland dysfunction; ATs artificial tears; PFAT's preservative free artificial tears; NSC nuclear sclerotic cataract; PSC posterior subcapsular cataract; ERM epi-retinal membrane; PVD posterior vitreous detachment; RD retinal detachment; DM diabetes mellitus; DR diabetic retinopathy; NPDR non-proliferative diabetic retinopathy; PDR proliferative diabetic retinopathy; CSME clinically significant macular edema; DME diabetic macular edema; dbh dot blot hemorrhages; CWS cotton wool spot; POAG primary  open angle glaucoma; C/D cup-to-disc ratio; HVF humphrey visual field; GVF goldmann visual field; OCT optical coherence tomography; IOP intraocular pressure; BRVO Branch retinal vein occlusion; CRVO central retinal vein occlusion; CRAO central retinal artery occlusion; BRAO branch retinal artery occlusion; RT retinal tear; SB scleral buckle; PPV pars plana vitrectomy; VH Vitreous hemorrhage; PRP panretinal laser photocoagulation; IVK intravitreal kenalog; VMT vitreomacular traction; MH Macular hole;  NVD neovascularization of the disc; NVE neovascularization elsewhere; AREDS age related eye disease study; ARMD age related macular degeneration; POAG primary open angle glaucoma; EBMD epithelial/anterior basement membrane dystrophy; ACIOL anterior chamber intraocular lens; IOL intraocular lens; PCIOL posterior chamber intraocular lens; Phaco/IOL phacoemulsification with intraocular lens placement; PRK photorefractive keratectomy; LASIK laser assisted in situ keratomileusis; HTN hypertension; DM diabetes mellitus; COPD chronic obstructive pulmonary disease

## 2023-10-31 ENCOUNTER — Ambulatory Visit (INDEPENDENT_AMBULATORY_CARE_PROVIDER_SITE_OTHER): Admitting: Ophthalmology

## 2023-10-31 ENCOUNTER — Encounter (INDEPENDENT_AMBULATORY_CARE_PROVIDER_SITE_OTHER): Payer: Self-pay | Admitting: Ophthalmology

## 2023-10-31 DIAGNOSIS — H34831 Tributary (branch) retinal vein occlusion, right eye, with macular edema: Secondary | ICD-10-CM

## 2023-10-31 DIAGNOSIS — I10 Essential (primary) hypertension: Secondary | ICD-10-CM | POA: Diagnosis not present

## 2023-10-31 DIAGNOSIS — H25813 Combined forms of age-related cataract, bilateral: Secondary | ICD-10-CM | POA: Diagnosis not present

## 2023-10-31 DIAGNOSIS — H35033 Hypertensive retinopathy, bilateral: Secondary | ICD-10-CM

## 2023-10-31 MED ORDER — AFLIBERCEPT 2MG/0.05ML IZ SOLN FOR KALEIDOSCOPE
2.0000 mg | INTRAVITREAL | Status: AC | PRN
  Administered 2023-10-31: 2 mg via INTRAVITREAL

## 2023-11-02 ENCOUNTER — Encounter (INDEPENDENT_AMBULATORY_CARE_PROVIDER_SITE_OTHER): Admitting: Ophthalmology

## 2023-11-18 DIAGNOSIS — I129 Hypertensive chronic kidney disease with stage 1 through stage 4 chronic kidney disease, or unspecified chronic kidney disease: Secondary | ICD-10-CM | POA: Diagnosis not present

## 2023-11-18 DIAGNOSIS — N1832 Chronic kidney disease, stage 3b: Secondary | ICD-10-CM | POA: Diagnosis not present

## 2023-11-18 DIAGNOSIS — E785 Hyperlipidemia, unspecified: Secondary | ICD-10-CM | POA: Diagnosis not present

## 2023-11-18 DIAGNOSIS — Z79899 Other long term (current) drug therapy: Secondary | ICD-10-CM | POA: Diagnosis not present

## 2023-11-18 DIAGNOSIS — D72819 Decreased white blood cell count, unspecified: Secondary | ICD-10-CM | POA: Diagnosis not present

## 2023-11-28 DIAGNOSIS — Z0000 Encounter for general adult medical examination without abnormal findings: Secondary | ICD-10-CM | POA: Diagnosis not present

## 2023-11-28 DIAGNOSIS — Z1331 Encounter for screening for depression: Secondary | ICD-10-CM | POA: Diagnosis not present

## 2023-11-28 DIAGNOSIS — N1832 Chronic kidney disease, stage 3b: Secondary | ICD-10-CM | POA: Diagnosis not present

## 2023-11-28 DIAGNOSIS — D171 Benign lipomatous neoplasm of skin and subcutaneous tissue of trunk: Secondary | ICD-10-CM | POA: Diagnosis not present

## 2023-11-28 DIAGNOSIS — Z800 Family history of malignant neoplasm of digestive organs: Secondary | ICD-10-CM | POA: Diagnosis not present

## 2023-11-28 DIAGNOSIS — I129 Hypertensive chronic kidney disease with stage 1 through stage 4 chronic kidney disease, or unspecified chronic kidney disease: Secondary | ICD-10-CM | POA: Diagnosis not present

## 2023-11-28 DIAGNOSIS — L570 Actinic keratosis: Secondary | ICD-10-CM | POA: Diagnosis not present

## 2023-11-28 DIAGNOSIS — Z1339 Encounter for screening examination for other mental health and behavioral disorders: Secondary | ICD-10-CM | POA: Diagnosis not present

## 2023-11-28 DIAGNOSIS — H34831 Tributary (branch) retinal vein occlusion, right eye, with macular edema: Secondary | ICD-10-CM | POA: Diagnosis not present

## 2023-11-28 DIAGNOSIS — E785 Hyperlipidemia, unspecified: Secondary | ICD-10-CM | POA: Diagnosis not present

## 2023-12-26 ENCOUNTER — Other Ambulatory Visit: Payer: Self-pay | Admitting: Internal Medicine

## 2023-12-26 DIAGNOSIS — Z1231 Encounter for screening mammogram for malignant neoplasm of breast: Secondary | ICD-10-CM

## 2023-12-27 NOTE — Progress Notes (Signed)
 [facility] - Clinic Note  01/02/2024    CHIEF COMPLAINT Patient presents for Retina Follow Up   HISTORY OF PRESENT ILLNESS: Emily Reid is a 78 y.o. female who presents to the clinic today for:  HPI     Retina Follow Up   Patient presents with  CRVO/BRVO.  In right eye.  This started 9 weeks ago.  I, the attending physician,  performed the HPI with the patient and updated documentation appropriately.        Comments   Patient here for 9 weeks retina follow up for BRVO OD. Patient states vision doing pretty good. No eye pain.       Last edited by Valdemar Rogue, MD on 01/02/2024  1:14 PM.    Pt states   Referring physician: Craven Locus MD [address] [place], [place] [address]  HISTORICAL INFORMATION:   Selected notes from the MEDICAL RECORD NUMBER Referred by Dr. Craven for continuation of eye injections OD for BRVO with edema LEE: 08/05/2020 Ocular Hx- BRVO, CME, iritis OD PMH- HTN   CURRENT MEDICATIONS: No current outpatient medications on file. (Ophthalmic Drugs)   No current facility-administered medications for this visit. (Ophthalmic Drugs)   Current Outpatient Medications (Other)  Medication Sig   amLODipine  (NORVASC ) 10 MG tablet Take 1 tablet (10 mg total) by mouth daily.   buPROPion (WELLBUTRIN XL) 150 MG 24 hr tablet Take 150 mg by mouth daily.   Coenzyme Q10 (COQ10) 100 MG CAPS Take by mouth daily.   mirtazapine  (REMERON ) 15 MG tablet TAKE 1 TABLET AT BEDTIME   Multiple Vitamin (MULTIVITAMIN) tablet Take 1 tablet by mouth daily.   Phosphatidylserine 100 MG CAPS Take by mouth.   rosuvastatin  (CRESTOR ) 20 MG tablet Take 20 mg by mouth daily.   sertraline  (ZOLOFT ) 50 MG tablet TAKE 1 TABLET EVERY DAY   Turmeric (QC TUMERIC COMPLEX PO) Take 553 mg by mouth daily.   vitamin C (ASCORBIC ACID) 500 MG tablet Take 500 mg by mouth daily.   aspirin  EC 325 MG tablet Take 1 tablet (325 mg total) by mouth daily. (Patient not  taking: Reported on 11/13/2021)   LOSARTAN POTASSIUM PO Take 1 tablet by mouth daily.   oxyCODONE  (OXY IR/ROXICODONE ) 5 MG immediate release tablet Take 1 tablet (5 mg total) by mouth every 4 (four) hours as needed (severe pain). (Patient not taking: Reported on 11/13/2021)   No current facility-administered medications for this visit. (Other)   REVIEW OF SYSTEMS: ROS   Positive for: Genitourinary, Eyes Negative for: Constitutional, Gastrointestinal, Neurological, Skin, Musculoskeletal, HENT, Endocrine, Cardiovascular, Respiratory, Psychiatric, Allergic/Imm, Heme/Lymph Last edited by Orval Asberry GORMAN, COA on 01/02/2024  1:07 PM.     ALLERGIES No Known Allergies  PAST MEDICAL HISTORY Past Medical History:  Diagnosis Date   Anxiety    Cataract    Mixed OU   Depression    Hyperlipidemia    Hypertension    Hypertensive retinopathy    OU   Left rotator cuff tear    Urine incontinence    Past Surgical History:  Procedure Laterality Date   FACIAL COSMETIC SURGERY     SHOULDER ARTHROSCOPY WITH SUBACROMIAL DECOMPRESSION, ROTATOR CUFF REPAIR AND BICEP TENDON REPAIR Left 11/17/2021   Procedure: LEFT SHOULDER ARTHROSCOPY WITH ROTATOR CUFF REPAIR AND BICEPS TENODESIS;  Surgeon: Genelle Standing, MD;  Location: [facility];  Service: Orthopedics;  Laterality: Left;   TONSILLECTOMY  1957   tummy tuck  FAMILY HISTORY Family History  Problem Relation Age of Onset   Heart disease Mother    Hyperlipidemia Mother    Hypertension Mother    Stroke Mother    SOCIAL HISTORY Social History   Tobacco Use   Smoking status: Never   Smokeless tobacco: Never  Vaping Use   Vaping status: Never Used  Substance Use Topics   Alcohol  use: Yes    Alcohol /week: 7.0 standard drinks of alcohol     Types: 7 Glasses of wine per week    Comment: social   Drug use: No       OPHTHALMIC EXAM:  Base Eye Exam     Visual Acuity (Snellen - Linear)       Right Left   Dist cc 20/25 -2  20/20    Correction: Glasses         Tonometry (Tonopen, 1:05 PM)       Right Left   Pressure 18 19         Pupils       Dark Light Shape React APD   Right 3 2 Round Brisk None   Left 3 2 Round Brisk None         Visual Fields (Counting fingers)       Left Right    Full Full         Extraocular Movement       Right Left    Full, Ortho Full, Ortho         Neuro/Psych     Oriented x3: Yes   Mood/Affect: Normal         Dilation     Both eyes: 1.0% Mydriacyl, 2.5% Phenylephrine  @ 1:05 PM           Slit Lamp and Fundus Exam     Slit Lamp Exam       Right Left   Lids/Lashes Normal Normal   Conjunctiva/Sclera White and quiet temporal pinguecula   Cornea 1+ inferior Punctate epithelial erosions 1+ inferior Punctate epithelial erosions   Anterior Chamber deep and clear deep and clear   Iris Round and dilated Round and dilated   Lens 2+ Nuclear sclerosis, 2+ Cortical cataract, focal Posterior subcapsular cataract at 0600 2+ Nuclear sclerosis, 2+ Cortical cataract, focal Posterior subcapsular cataract at 0700   Anterior Vitreous Vitreous syneresis, silicone oil micro bubbles Vitreous syneresis, silicone oil micro bubbles, Posterior vitreous detachment         Fundus Exam       Right Left   Disc Pink and Sharp, mild PPA Pink and Sharp   C/D Ratio 0.2 0.2   Macula Flat, Blunted foveal reflex, Drusen, RPE mottling, No heme, stable improvement in central cystic changes / edema inferior macula and fovea Flat, good foveal reflex, Drusen, RPE mottling and clumping, No heme or edema   Vessels attenuated, mild tortuosity attenuated, mild tortuosity   Periphery Attached, no heme Attached, mild mid zonal drusen, no heme              Refraction     Wearing Rx       Sphere Cylinder Axis Add   Right +0.75 +1.25 179 +2.75   Left +1.75 +0.50 005 +2.75           IMAGING AND PROCEDURES  Imaging and Procedures for 01/02/2024  OCT, Retina - OU -  Both Eyes       Right Eye Quality was good. Central Foveal Thickness: 285. Progression has been stable. Findings  include normal foveal contour, no IRF, no SRF (Hx of BRVO, stable improvement in trace central cystic changes).   Left Eye Quality was good. Central Foveal Thickness: 296. Progression has been stable. Findings include normal foveal contour, no IRF, no SRF, retinal drusen .   Notes *Images captured and stored on drive  Diagnosis / Impression:  OD: Hx of BRVO, stable improvement in trace central cystic changes OS: NFP, no IRF/SRF, +drusen  Clinical management:  See below  Abbreviations: NFP - Normal foveal profile. CME - cystoid macular edema. PED - pigment epithelial detachment. IRF - intraretinal fluid. SRF - subretinal fluid. EZ - ellipsoid zone. ERM - epiretinal membrane. ORA - outer retinal atrophy. ORT - outer retinal tubulation. SRHM - subretinal hyper-reflective material. IRHM - intraretinal hyper-reflective material      Intravitreal Injection, Pharmacologic Agent - OD - Right Eye       Time Out 01/02/2024. 1:20 PM. Confirmed correct patient, procedure, site, and patient consented.   Anesthesia Topical anesthesia was used. Anesthetic medications included Lidocaine  2%, Proparacaine 0.5%.   Procedure Preparation included 5% betadine to ocular surface, eyelid speculum. A (32g) needle was used.   Injection: 2 mg aflibercept  2 MG/0.05ML   Route: Intravitreal, Site: Right Eye   NDC: [id number], Lot: [id number], Expiration date: 04/27/2025, Waste: 0 mL   Post-op Post injection exam found visual acuity of at least counting fingers. The patient tolerated the procedure well. There were no complications. The patient received written and verbal post procedure care education. Post injection medications were not given.            ASSESSMENT/PLAN:    ICD-10-CM   1. Branch retinal vein occlusion of right eye with macular edema  H34.8310 OCT, Retina - OU - Both  Eyes    Intravitreal Injection, Pharmacologic Agent - OD - Right Eye    aflibercept  (EYLEA ) SOLN 2 mg    2. Essential hypertension  I10     3. Hypertensive retinopathy of both eyes  H35.033     4. Combined forms of age-related cataract of both eyes  H25.813      1. BRVO w/ CME OD  - delayed f/u -- 8 wks instead of 7 (07.25.24 to 09.20.24) due to [place] travel - delayed to follow up from 9 weeks to 13 weeks (11.01.23 - 01.31.24) -- worse edema - s/p IVA OD #1 (03.30.22), #2 (05.23.22), #3 (7.25.22), #4 (10.05.22), #5 (12.14.22), #6 (03.15.23), #7 (05.31.23), #8 (08.16.23), #9 (11.01.23), #10 (01.31.24), #11 (03.27.24), #12 (05.29.24), #13 (07.25.24) -- IVA Resistance ========================== - s/p IVE OD #1 (09.20.24), #2 (11.01.24), #3 (12.13.24), #4 (01.23.25), #5 (03.12.25), #6 (05.05.25) - hx of injections with Dr. Craven in [place], [place] -- pt was receiving Lucentis q7-8 wks; pt reports history Avastin , but was switched to Lucentis at some point - last injection IVL OD was 02.04.22 (7.5 wks prior to initial visit here) **h/o worsening IRF at 13 weeks on 01.31.24 and 03.15.23; 11 wks on 11.01.23; 8 wks on 07.25.24 and 09.20.24** - BCVA OD 20/25 - stable - OCT today shows stable improvement in trace central cystic changes at 9 weeks - recommend IVE OD #7 today, 07.07.25 -- w/ f/u extended to 10 wks - RBA of procedure discussed, questions answered - IVE informed consent obtained and signed, 09.20.24 (OD) - see procedure note  - approved for Eylea , but Good Days denied due to income restrictions -- pt covering 20% coinsurance - f/u 10 wks -- DFE, OCT, possible injection  2,3. Hypertensive  retinopathy OU - discussed importance of tight BP control - monitor  4. Mixed Cataract OU - The symptoms of cataract, surgical options, and treatments and risks were discussed with patient. - discussed diagnosis and progression - monitor  Ophthalmic Meds Ordered this visit:  Meds ordered  this encounter  Medications   aflibercept  (EYLEA ) SOLN 2 mg     Return in about 10 weeks (around 03/12/2024) for f/u BRVO OD, DFE, OCT, Possible Injxn.  There are no Patient Instructions on file for this visit.  This document serves as a record of services personally performed by Redell JUDITHANN Hans, MD, PhD. It was created on their behalf by Avelina Pereyra, COA an ophthalmic technician. The creation of this record is the provider's dictation and/or activities during the visit.   Electronically signed by: Avelina GORMAN Pereyra, COT  01/02/24  1:33 PM   This document serves as a record of services personally performed by Redell JUDITHANN Hans, MD, PhD. It was created on their behalf by Alan PARAS. Delores, OA an ophthalmic technician. The creation of this record is the provider's dictation and/or activities during the visit.    Electronically signed by: Alan PARAS. Delores, OA 01/02/24 1:33 PM  Redell JUDITHANN Hans, M.D., Ph.D. Diseases & Surgery of the Retina and Vitreous [facility]  I have reviewed the above documentation for accuracy and completeness, and I agree with the above. Redell JUDITHANN Hans, M.D., Ph.D. 01/02/24 1:33 PM   Abbreviations: M myopia (nearsighted); A astigmatism; H hyperopia (farsighted); P presbyopia; Mrx spectacle prescription;  CTL contact lenses; OD right eye; OS left eye; OU both eyes  XT exotropia; ET esotropia; PEK punctate epithelial keratitis; PEE punctate epithelial erosions; DES dry eye syndrome; MGD meibomian gland dysfunction; ATs artificial tears; PFAT's preservative free artificial tears; NSC nuclear sclerotic cataract; PSC posterior subcapsular cataract; ERM epi-retinal membrane; PVD posterior vitreous detachment; RD retinal detachment; DM diabetes mellitus; DR diabetic retinopathy; NPDR non-proliferative diabetic retinopathy; PDR proliferative diabetic retinopathy; CSME clinically significant macular edema; DME diabetic macular edema; dbh dot blot hemorrhages; CWS  cotton wool spot; POAG primary open angle glaucoma; C/D cup-to-disc ratio; HVF humphrey visual field; GVF goldmann visual field; OCT optical coherence tomography; IOP intraocular pressure; BRVO Branch retinal vein occlusion; CRVO central retinal vein occlusion; CRAO central retinal artery occlusion; BRAO branch retinal artery occlusion; RT retinal tear; SB scleral buckle; PPV pars plana vitrectomy; VH Vitreous hemorrhage; PRP panretinal laser photocoagulation; IVK intravitreal kenalog; VMT vitreomacular traction; MH Macular hole;  NVD neovascularization of the disc; NVE neovascularization elsewhere; AREDS age related eye disease study; ARMD age related macular degeneration; POAG primary open angle glaucoma; EBMD epithelial/anterior basement membrane dystrophy; ACIOL anterior chamber intraocular lens; IOL intraocular lens; PCIOL posterior chamber intraocular lens; Phaco/IOL phacoemulsification with intraocular lens placement; PRK photorefractive keratectomy; LASIK laser assisted in situ keratomileusis; HTN hypertension; DM diabetes mellitus; COPD chronic obstructive pulmonary disease

## 2024-01-02 ENCOUNTER — Ambulatory Visit (INDEPENDENT_AMBULATORY_CARE_PROVIDER_SITE_OTHER): Admitting: Ophthalmology

## 2024-01-02 ENCOUNTER — Encounter (INDEPENDENT_AMBULATORY_CARE_PROVIDER_SITE_OTHER): Payer: Self-pay | Admitting: Ophthalmology

## 2024-01-02 DIAGNOSIS — H34831 Tributary (branch) retinal vein occlusion, right eye, with macular edema: Secondary | ICD-10-CM

## 2024-01-02 DIAGNOSIS — I10 Essential (primary) hypertension: Secondary | ICD-10-CM | POA: Diagnosis not present

## 2024-01-02 DIAGNOSIS — H35033 Hypertensive retinopathy, bilateral: Secondary | ICD-10-CM

## 2024-01-02 DIAGNOSIS — H25813 Combined forms of age-related cataract, bilateral: Secondary | ICD-10-CM

## 2024-01-02 MED ORDER — AFLIBERCEPT 2MG/0.05ML IZ SOLN FOR KALEIDOSCOPE
2.0000 mg | INTRAVITREAL | Status: AC | PRN
  Administered 2024-01-02: 2 mg via INTRAVITREAL

## 2024-01-23 ENCOUNTER — Ambulatory Visit
Admission: RE | Admit: 2024-01-23 | Discharge: 2024-01-23 | Disposition: A | Discharge status: Home / Self Care | Source: Ambulatory Visit

## 2024-01-23 DIAGNOSIS — Z1231 Encounter for screening mammogram for malignant neoplasm of breast: Secondary | ICD-10-CM

## 2024-02-13 DIAGNOSIS — N1832 Chronic kidney disease, stage 3b: Secondary | ICD-10-CM | POA: Diagnosis not present

## 2024-02-13 DIAGNOSIS — Z800 Family history of malignant neoplasm of digestive organs: Secondary | ICD-10-CM | POA: Diagnosis not present

## 2024-02-13 DIAGNOSIS — R197 Diarrhea, unspecified: Secondary | ICD-10-CM | POA: Diagnosis not present

## 2024-02-13 DIAGNOSIS — I129 Hypertensive chronic kidney disease with stage 1 through stage 4 chronic kidney disease, or unspecified chronic kidney disease: Secondary | ICD-10-CM | POA: Diagnosis not present

## 2024-03-06 DIAGNOSIS — L821 Other seborrheic keratosis: Secondary | ICD-10-CM | POA: Diagnosis not present

## 2024-03-06 DIAGNOSIS — D225 Melanocytic nevi of trunk: Secondary | ICD-10-CM | POA: Diagnosis not present

## 2024-03-06 DIAGNOSIS — L819 Disorder of pigmentation, unspecified: Secondary | ICD-10-CM | POA: Diagnosis not present

## 2024-03-06 DIAGNOSIS — L570 Actinic keratosis: Secondary | ICD-10-CM | POA: Diagnosis not present

## 2024-03-08 NOTE — Progress Notes (Shared)
 [facility] - Clinic Note  03/09/2024    CHIEF COMPLAINT Patient presents for Retina Follow Up   HISTORY OF PRESENT ILLNESS: Emily Reid is a 78 y.o. female who presents to the clinic today for:  HPI     Retina Follow Up   Patient presents with  CRVO/BRVO.  In right eye.  This started 10 weeks ago.  Duration of 10.  Since onset it is stable.        Comments   10 week retina follow up BRVO OD pt is reporting no vision changes noticed she denies any flashes or floaters       Last edited by Resa Delon ORN, COT on 03/09/2024 12:33 PM.     Pt states she's doing well, slight change in blurriness.   Referring physician: Craven Locus MD [address] [place], [place] [address]  HISTORICAL INFORMATION:   Selected notes from the MEDICAL RECORD NUMBER Referred by Dr. Craven for continuation of eye injections OD for BRVO with edema LEE: 08/05/2020 Ocular Hx- BRVO, CME, iritis OD PMH- HTN   CURRENT MEDICATIONS: No current outpatient medications on file. (Ophthalmic Drugs)   No current facility-administered medications for this visit. (Ophthalmic Drugs)   Current Outpatient Medications (Other)  Medication Sig   amLODipine  (NORVASC ) 10 MG tablet Take 1 tablet (10 mg total) by mouth daily.   buPROPion (WELLBUTRIN XL) 150 MG 24 hr tablet Take 150 mg by mouth daily.   Coenzyme Q10 (COQ10) 100 MG CAPS Take by mouth daily.   LOSARTAN POTASSIUM PO Take 1 tablet by mouth daily.   mirtazapine  (REMERON ) 15 MG tablet TAKE 1 TABLET AT BEDTIME   Multiple Vitamin (MULTIVITAMIN) tablet Take 1 tablet by mouth daily.   Phosphatidylserine 100 MG CAPS Take by mouth.   rosuvastatin  (CRESTOR ) 20 MG tablet Take 20 mg by mouth daily.   sertraline  (ZOLOFT ) 50 MG tablet TAKE 1 TABLET EVERY DAY   Turmeric (QC TUMERIC COMPLEX PO) Take 553 mg by mouth daily.   vitamin C (ASCORBIC ACID) 500 MG tablet Take 500 mg by mouth daily.   aspirin  EC 325 MG tablet Take 1  tablet (325 mg total) by mouth daily. (Patient not taking: Reported on 11/13/2021)   oxyCODONE  (OXY IR/ROXICODONE ) 5 MG immediate release tablet Take 1 tablet (5 mg total) by mouth every 4 (four) hours as needed (severe pain). (Patient not taking: Reported on 11/13/2021)   No current facility-administered medications for this visit. (Other)   REVIEW OF SYSTEMS: ROS   Positive for: Genitourinary, Eyes Negative for: Constitutional, Gastrointestinal, Neurological, Skin, Musculoskeletal, HENT, Endocrine, Cardiovascular, Respiratory, Psychiatric, Allergic/Imm, Heme/Lymph Last edited by Resa Delon ORN, COT on 03/09/2024 12:33 PM.      ALLERGIES No Known Allergies  PAST MEDICAL HISTORY Past Medical History:  Diagnosis Date   Anxiety    Cataract    Mixed OU   Depression    Hyperlipidemia    Hypertension    Hypertensive retinopathy    OU   Left rotator cuff tear    Urine incontinence    Past Surgical History:  Procedure Laterality Date   FACIAL COSMETIC SURGERY     SHOULDER ARTHROSCOPY WITH SUBACROMIAL DECOMPRESSION, ROTATOR CUFF REPAIR AND BICEP TENDON REPAIR Left 11/17/2021   Procedure: LEFT SHOULDER ARTHROSCOPY WITH ROTATOR CUFF REPAIR AND BICEPS TENODESIS;  Surgeon: Genelle Standing, MD;  Location: [facility];  Service: Orthopedics;  Laterality: Left;   TONSILLECTOMY  1957   tummy tuck  FAMILY HISTORY Family History  Problem Relation Age of Onset   Heart disease Mother    Hyperlipidemia Mother    Hypertension Mother    Stroke Mother    SOCIAL HISTORY Social History   Tobacco Use   Smoking status: Never   Smokeless tobacco: Never  Vaping Use   Vaping status: Never Used  Substance Use Topics   Alcohol  use: Yes    Alcohol /week: 7.0 standard drinks of alcohol     Types: 7 Glasses of wine per week    Comment: social   Drug use: No       OPHTHALMIC EXAM:  Base Eye Exam     Visual Acuity (Snellen - Linear)       Right Left   Dist cc 20/25  -2 20/25 -2   Dist ph cc NI NI    Correction: Glasses         Tonometry (Tonopen, 12:38 PM)       Right Left   Pressure 14 18         Pupils       Pupils Dark Light Shape React APD   Right PERRL 3 2 Round Brisk None   Left PERRL 3 2 Round Brisk None         Visual Fields       Left Right    Full Full         Extraocular Movement       Right Left    Full, Ortho Full, Ortho         Neuro/Psych     Oriented x3: Yes   Mood/Affect: Normal         Dilation     Both eyes: 2.5% Phenylephrine  @ 12:38 PM           Slit Lamp and Fundus Exam     Slit Lamp Exam       Right Left   Lids/Lashes Normal Normal   Conjunctiva/Sclera White and quiet temporal pinguecula   Cornea 1+ inferior Punctate epithelial erosions 1+ inferior Punctate epithelial erosions   Anterior Chamber deep and clear deep and clear   Iris Round and dilated Round and dilated   Lens 2+ Nuclear sclerosis, 2+ Cortical cataract, focal Posterior subcapsular cataract at 0600 2+ Nuclear sclerosis, 2+ Cortical cataract, focal Posterior subcapsular cataract at 0700   Anterior Vitreous Vitreous syneresis, silicone oil micro bubbles Vitreous syneresis, silicone oil micro bubbles, Posterior vitreous detachment         Fundus Exam       Right Left   Disc Pink and Sharp, mild PPA Pink and Sharp   C/D Ratio 0.2 0.2   Macula Flat, Blunted foveal reflex, Drusen, RPE mottling, No heme, stable improvement in central cystic changes / edema inferior macula and fovea Flat, good foveal reflex, Drusen, RPE mottling and clumping, No heme or edema   Vessels attenuated, mild tortuosity attenuated, mild tortuosity   Periphery Attached, no heme Attached, mild mid zonal drusen, no heme              Refraction     Wearing Rx       Sphere Cylinder Axis Add   Right +0.75 +1.25 179 +2.75   Left +1.75 +0.50 005 +2.75           IMAGING AND PROCEDURES  Imaging and Procedures for 03/09/2024           ASSESSMENT/PLAN:    ICD-10-CM   1. Branch retinal vein occlusion  of right eye with macular edema  H34.8310 OCT, Retina - OU - Both Eyes    2. Essential hypertension  I10     3. Hypertensive retinopathy of both eyes  H35.033     4. Combined forms of age-related cataract of both eyes  H25.813       1. BRVO w/ CME OD  - delayed f/u -- 8 wks instead of 7 (07.25.24 to 09.20.24) due to [place] travel - delayed to follow up from 9 weeks to 13 weeks (11.01.23 - 01.31.24) -- worse edema - s/p IVA OD #1 (03.30.22), #2 (05.23.22), #3 (7.25.22), #4 (10.05.22), #5 (12.14.22), #6 (03.15.23), #7 (05.31.23), #8 (08.16.23), #9 (11.01.23), #10 (01.31.24), #11 (03.27.24), #12 (05.29.24), #13 (07.25.24) -- IVA Resistance ========================== - s/p IVE OD #1 (09.20.24), #2 (11.01.24), #3 (12.13.24), #4 (01.23.25), #5 (03.12.25), #6 (05.05.25), #7 (07.07.25) - hx of injections with Dr. Craven in [place], [place] -- pt was receiving Lucentis q7-8 wks; pt reports history Avastin , but was switched to Lucentis at some point - last injection IVL OD was 02.04.22 (7.5 wks prior to initial visit here) **h/o worsening IRF at 13 weeks on 01.31.24 and 03.15.23; 11 wks on 11.01.23; 8 wks on 07.25.24 and 09.20.24** - BCVA OD 20/25 - stable - OCT today shows trace central cystic changes--slightly increased at 10 weeks - recommend IVE OD #8 today, 09.12.25 -- w/ f/u staying at 10 wks - RBA of procedure discussed, questions answered - IVE informed consent obtained and signed, 09.20.24 (OD) - see procedure note  - approved for Eylea , but Good Days denied due to income restrictions -- pt covering 20% coinsurance - f/u 10 wks -- DFE, OCT, possible injection  2,3. Hypertensive retinopathy OU - discussed importance of tight BP control - monitor  4. Mixed Cataract OU - The symptoms of cataract, surgical options, and treatments and risks were discussed with patient. - discussed diagnosis and progression -  monitor  Ophthalmic Meds Ordered this visit:  No orders of the defined types were placed in this encounter.    No follow-ups on file.  There are no Patient Instructions on file for this visit.  This document serves as a record of services personally performed by Redell JUDITHANN Hans, MD, PhD. It was created on their behalf by Almetta Pesa, an ophthalmic technician. The creation of this record is the provider's dictation and/or activities during the visit.    Electronically signed by: Almetta Pesa, OA, 03/09/24  12:57 PM   Redell JUDITHANN Hans, M.D., Ph.D. Diseases & Surgery of the Retina and Vitreous [facility]   Abbreviations: M myopia (nearsighted); A astigmatism; H hyperopia (farsighted); P presbyopia; Mrx spectacle prescription;  CTL contact lenses; OD right eye; OS left eye; OU both eyes  XT exotropia; ET esotropia; PEK punctate epithelial keratitis; PEE punctate epithelial erosions; DES dry eye syndrome; MGD meibomian gland dysfunction; ATs artificial tears; PFAT's preservative free artificial tears; NSC nuclear sclerotic cataract; PSC posterior subcapsular cataract; ERM epi-retinal membrane; PVD posterior vitreous detachment; RD retinal detachment; DM diabetes mellitus; DR diabetic retinopathy; NPDR non-proliferative diabetic retinopathy; PDR proliferative diabetic retinopathy; CSME clinically significant macular edema; DME diabetic macular edema; dbh dot blot hemorrhages; CWS cotton wool spot; POAG primary open angle glaucoma; C/D cup-to-disc ratio; HVF humphrey visual field; GVF goldmann visual field; OCT optical coherence tomography; IOP intraocular pressure; BRVO Branch retinal vein occlusion; CRVO central retinal vein occlusion; CRAO central retinal artery occlusion; BRAO branch retinal artery occlusion; RT retinal tear; SB scleral buckle;  PPV pars plana vitrectomy; VH Vitreous hemorrhage; PRP panretinal laser photocoagulation; IVK intravitreal kenalog; VMT  vitreomacular traction; MH Macular hole;  NVD neovascularization of the disc; NVE neovascularization elsewhere; AREDS age related eye disease study; ARMD age related macular degeneration; POAG primary open angle glaucoma; EBMD epithelial/anterior basement membrane dystrophy; ACIOL anterior chamber intraocular lens; IOL intraocular lens; PCIOL posterior chamber intraocular lens; Phaco/IOL phacoemulsification with intraocular lens placement; PRK photorefractive keratectomy; LASIK laser assisted in situ keratomileusis; HTN hypertension; DM diabetes mellitus; COPD chronic obstructive pulmonary disease

## 2024-03-08 NOTE — Progress Notes (Signed)
 [facility] - Clinic Note  03/09/2024    CHIEF COMPLAINT Patient presents for Retina Follow Up   HISTORY OF PRESENT ILLNESS: Emily Reid is a 78 y.o. female who presents to the clinic today for:  HPI     Retina Follow Up   Patient presents with  CRVO/BRVO.  In right eye.  This started 10 weeks ago.  Duration of 10.  Since onset it is stable.  I, the attending physician,  performed the HPI with the patient and updated documentation appropriately.        Comments   10 week retina follow up BRVO OD pt is reporting no vision changes noticed she denies any flashes or floaters       Last edited by Valdemar Rogue, MD on 03/09/2024  1:46 PM.    Pt states   Referring physician: Craven Locus MD [address] [place], [place] [address]  HISTORICAL INFORMATION:   Selected notes from the MEDICAL RECORD NUMBER Referred by Dr. Craven for continuation of eye injections OD for BRVO with edema LEE: 08/05/2020 Ocular Hx- BRVO, CME, iritis OD PMH- HTN   CURRENT MEDICATIONS: No current outpatient medications on file. (Ophthalmic Drugs)   No current facility-administered medications for this visit. (Ophthalmic Drugs)   Current Outpatient Medications (Other)  Medication Sig   amLODipine  (NORVASC ) 10 MG tablet Take 1 tablet (10 mg total) by mouth daily.   buPROPion (WELLBUTRIN XL) 150 MG 24 hr tablet Take 150 mg by mouth daily.   Coenzyme Q10 (COQ10) 100 MG CAPS Take by mouth daily.   LOSARTAN POTASSIUM PO Take 1 tablet by mouth daily.   mirtazapine  (REMERON ) 15 MG tablet TAKE 1 TABLET AT BEDTIME   Multiple Vitamin (MULTIVITAMIN) tablet Take 1 tablet by mouth daily.   Phosphatidylserine 100 MG CAPS Take by mouth.   rosuvastatin  (CRESTOR ) 20 MG tablet Take 20 mg by mouth daily.   sertraline  (ZOLOFT ) 50 MG tablet TAKE 1 TABLET EVERY DAY   Turmeric (QC TUMERIC COMPLEX PO) Take 553 mg by mouth daily.   vitamin C (ASCORBIC ACID) 500 MG tablet Take 500 mg by  mouth daily.   aspirin  EC 325 MG tablet Take 1 tablet (325 mg total) by mouth daily. (Patient not taking: Reported on 11/13/2021)   oxyCODONE  (OXY IR/ROXICODONE ) 5 MG immediate release tablet Take 1 tablet (5 mg total) by mouth every 4 (four) hours as needed (severe pain). (Patient not taking: Reported on 11/13/2021)   No current facility-administered medications for this visit. (Other)   REVIEW OF SYSTEMS: ROS   Positive for: Genitourinary, Eyes Negative for: Constitutional, Gastrointestinal, Neurological, Skin, Musculoskeletal, HENT, Endocrine, Cardiovascular, Respiratory, Psychiatric, Allergic/Imm, Heme/Lymph Last edited by Resa Delon ORN, COT on 03/09/2024 12:33 PM.      ALLERGIES No Known Allergies  PAST MEDICAL HISTORY Past Medical History:  Diagnosis Date   Anxiety    Cataract    Mixed OU   Depression    Hyperlipidemia    Hypertension    Hypertensive retinopathy    OU   Left rotator cuff tear    Urine incontinence    Past Surgical History:  Procedure Laterality Date   FACIAL COSMETIC SURGERY     SHOULDER ARTHROSCOPY WITH SUBACROMIAL DECOMPRESSION, ROTATOR CUFF REPAIR AND BICEP TENDON REPAIR Left 11/17/2021   Procedure: LEFT SHOULDER ARTHROSCOPY WITH ROTATOR CUFF REPAIR AND BICEPS TENODESIS;  Surgeon: Genelle Standing, MD;  Location: [facility];  Service: Orthopedics;  Laterality: Left;  TONSILLECTOMY  1957   tummy tuck     FAMILY HISTORY Family History  Problem Relation Age of Onset   Heart disease Mother    Hyperlipidemia Mother    Hypertension Mother    Stroke Mother    SOCIAL HISTORY Social History   Tobacco Use   Smoking status: Never   Smokeless tobacco: Never  Vaping Use   Vaping status: Never Used  Substance Use Topics   Alcohol  use: Yes    Alcohol /week: 7.0 standard drinks of alcohol     Types: 7 Glasses of wine per week    Comment: social   Drug use: No       OPHTHALMIC EXAM:  Base Eye Exam     Visual Acuity  (Snellen - Linear)       Right Left   Dist cc 20/25 -2 20/25 -2   Dist ph cc NI NI    Correction: Glasses         Tonometry (Tonopen, 12:38 PM)       Right Left   Pressure 14 18         Pupils       Pupils Dark Light Shape React APD   Right PERRL 3 2 Round Brisk None   Left PERRL 3 2 Round Brisk None         Visual Fields       Left Right    Full Full         Extraocular Movement       Right Left    Full, Ortho Full, Ortho         Neuro/Psych     Oriented x3: Yes   Mood/Affect: Normal         Dilation     Both eyes: 2.5% Phenylephrine  @ 12:38 PM           Slit Lamp and Fundus Exam     Slit Lamp Exam       Right Left   Lids/Lashes Normal Normal   Conjunctiva/Sclera White and quiet temporal pinguecula   Cornea 1+ inferior Punctate epithelial erosions 1+ inferior Punctate epithelial erosions   Anterior Chamber deep and clear deep and clear   Iris Round and dilated Round and dilated   Lens 2+ Nuclear sclerosis, 2+ Cortical cataract, focal Posterior subcapsular cataract at 0600 2+ Nuclear sclerosis, 2+ Cortical cataract, focal Posterior subcapsular cataract at 0700   Anterior Vitreous Vitreous syneresis, silicone oil micro bubbles Vitreous syneresis, silicone oil micro bubbles, Posterior vitreous detachment         Fundus Exam       Right Left   Disc Pink and Sharp, mild PPA Pink and Sharp   C/D Ratio 0.2 0.2   Macula Flat, Blunted foveal reflex, Drusen, RPE mottling, No heme, stable improvement in central cystic changes / edema inferior macula and fovea Flat, good foveal reflex, Drusen, RPE mottling and clumping, No heme or edema   Vessels attenuated, mild tortuosity attenuated, mild tortuosity   Periphery Attached, no heme Attached, mild mid zonal drusen, no heme              Refraction     Wearing Rx       Sphere Cylinder Axis Add   Right +0.75 +1.25 179 +2.75   Left +1.75 +0.50 005 +2.75           IMAGING AND  PROCEDURES  Imaging and Procedures for 03/09/2024  OCT, Retina - OU - Both Eyes  Right Eye Quality was good. Central Foveal Thickness: 282. Progression has been stable. Findings include normal foveal contour, no IRF, no SRF (Hx of BRVO, trace central cystic changes--slightly increased).   Left Eye Quality was good. Central Foveal Thickness: 301. Progression has been stable. Findings include normal foveal contour, no IRF, no SRF, retinal drusen .   Notes *Images captured and stored on drive  Diagnosis / Impression:  OD: Hx of BRVO, trace central cystic changes--slightly increased OS: NFP, no IRF/SRF, +drusen  Clinical management:  See below  Abbreviations: NFP - Normal foveal profile. CME - cystoid macular edema. PED - pigment epithelial detachment. IRF - intraretinal fluid. SRF - subretinal fluid. EZ - ellipsoid zone. ERM - epiretinal membrane. ORA - outer retinal atrophy. ORT - outer retinal tubulation. SRHM - subretinal hyper-reflective material. IRHM - intraretinal hyper-reflective material      Intravitreal Injection, Pharmacologic Agent - OD - Right Eye       Time Out 03/09/2024. 1:06 PM. Confirmed correct patient, procedure, site, and patient consented.   Anesthesia Topical anesthesia was used. Anesthetic medications included Lidocaine  2%, Proparacaine 0.5%.   Procedure Preparation included 5% betadine to ocular surface, eyelid speculum. A (32g) needle was used.   Injection: 2 mg aflibercept  2 MG/0.05ML   Route: Intravitreal, Site: Right Eye   NDC: Q956576, Lot: [id number], Expiration date: 04/27/2024, Waste: 0 mL   Post-op Post injection exam found visual acuity of at least counting fingers. The patient tolerated the procedure well. There were no complications. The patient received written and verbal post procedure care education. Post injection medications were not given.            ASSESSMENT/PLAN:    ICD-10-CM   1. Branch retinal vein occlusion  of right eye with macular edema  H34.8310 OCT, Retina - OU - Both Eyes    Intravitreal Injection, Pharmacologic Agent - OD - Right Eye    aflibercept  (EYLEA ) SOLN 2 mg    2. Essential hypertension  I10     3. Hypertensive retinopathy of both eyes  H35.033     4. Combined forms of age-related cataract of both eyes  H25.813      1. BRVO w/ CME OD  - delayed f/u -- 8 wks instead of 7 (07.25.24 to 09.20.24) due to [place] travel - delayed to follow up from 9 weeks to 13 weeks (11.01.23 - 01.31.24) -- worse edema - s/p IVA OD #1 (03.30.22), #2 (05.23.22), #3 (7.25.22), #4 (10.05.22), #5 (12.14.22), #6 (03.15.23), #7 (05.31.23), #8 (08.16.23), #9 (11.01.23), #10 (01.31.24), #11 (03.27.24), #12 (05.29.24), #13 (07.25.24) -- IVA Resistance ========================== - s/p IVE OD #1 (09.20.24), #2 (11.01.24), #3 (12.13.24), #4 (01.23.25), #5 (03.12.25), #6 (05.05.25) #7(07.07.2025) - hx of injections with Dr. Craven in [place], [place] -- pt was receiving Lucentis q7-8 wks; pt reports history Avastin , but was switched to Lucentis at some point - last injection IVL OD was 02.04.22 (7.5 wks prior to initial visit here) **h/o worsening IRF at 13 weeks on 01.31.24 and 03.15.23; 11 wks on 11.01.23; 8 wks on 07.25.24 and 09.20.24 [IVA]** - BCVA OD 20/25 - stable - OCT today shows trace central cystic changes--slightly increased at 10 weeks - recommend IVE OD #8 today, 09.12.25 -- w/ f/u in 10 wks again - RBA of procedure discussed, questions answered - IVE informed consent obtained and signed, 09.20.24 (OD) - see procedure note  - approved for Eylea , but [employer] Days denied due to income restrictions -- pt covering 20% coinsurance - f/u  10 wks -- DFE, OCT, possible injection  2,3. Hypertensive retinopathy OU - discussed importance of tight BP control - monitor  4. Mixed Cataract OU - The symptoms of cataract, surgical options, and treatments and risks were discussed with patient. - discussed  diagnosis and progression - monitor  Ophthalmic Meds Ordered this visit:  Meds ordered this encounter  Medications   aflibercept  (EYLEA ) SOLN 2 mg     Return in about 10 weeks (around 05/18/2024) for BRVO OD, DFE, OCT.  There are no Patient Instructions on file for this visit.  This document serves as a record of services personally performed by Redell JUDITHANN Hans, MD, PhD. It was created on their behalf by Delon Newness COT, an ophthalmic technician. The creation of this record is the provider's dictation and/or activities during the visit.    Electronically signed by: Delon Newness COT 09.11.2025  1:47 PM  Redell JUDITHANN Hans, M.D., Ph.D. Diseases & Surgery of the Retina and Vitreous [facility] 03/09/2024   I have reviewed the above documentation for accuracy and completeness, and I agree with the above. Redell JUDITHANN Hans, M.D., Ph.D. 03/09/24 1:48 PM   Abbreviations: M myopia (nearsighted); A astigmatism; H hyperopia (farsighted); P presbyopia; Mrx spectacle prescription;  CTL contact lenses; OD right eye; OS left eye; OU both eyes  XT exotropia; ET esotropia; PEK punctate epithelial keratitis; PEE punctate epithelial erosions; DES dry eye syndrome; MGD meibomian gland dysfunction; ATs artificial tears; PFAT's preservative free artificial tears; NSC nuclear sclerotic cataract; PSC posterior subcapsular cataract; ERM epi-retinal membrane; PVD posterior vitreous detachment; RD retinal detachment; DM diabetes mellitus; DR diabetic retinopathy; NPDR non-proliferative diabetic retinopathy; PDR proliferative diabetic retinopathy; CSME clinically significant macular edema; DME diabetic macular edema; dbh dot blot hemorrhages; CWS cotton wool spot; POAG primary open angle glaucoma; C/D cup-to-disc ratio; HVF humphrey visual field; GVF goldmann visual field; OCT optical coherence tomography; IOP intraocular pressure; BRVO Branch retinal vein occlusion; CRVO central  retinal vein occlusion; CRAO central retinal artery occlusion; BRAO branch retinal artery occlusion; RT retinal tear; SB scleral buckle; PPV pars plana vitrectomy; VH Vitreous hemorrhage; PRP panretinal laser photocoagulation; IVK intravitreal kenalog; VMT vitreomacular traction; MH Macular hole;  NVD neovascularization of the disc; NVE neovascularization elsewhere; AREDS age related eye disease study; ARMD age related macular degeneration; POAG primary open angle glaucoma; EBMD epithelial/anterior basement membrane dystrophy; ACIOL anterior chamber intraocular lens; IOL intraocular lens; PCIOL posterior chamber intraocular lens; Phaco/IOL phacoemulsification with intraocular lens placement; PRK photorefractive keratectomy; LASIK laser assisted in situ keratomileusis; HTN hypertension; DM diabetes mellitus; COPD chronic obstructive pulmonary disease

## 2024-03-09 ENCOUNTER — Ambulatory Visit (INDEPENDENT_AMBULATORY_CARE_PROVIDER_SITE_OTHER): Admitting: Ophthalmology

## 2024-03-09 ENCOUNTER — Encounter (INDEPENDENT_AMBULATORY_CARE_PROVIDER_SITE_OTHER): Admitting: Ophthalmology

## 2024-03-09 ENCOUNTER — Encounter (INDEPENDENT_AMBULATORY_CARE_PROVIDER_SITE_OTHER): Payer: Self-pay

## 2024-03-09 ENCOUNTER — Encounter (INDEPENDENT_AMBULATORY_CARE_PROVIDER_SITE_OTHER): Payer: Self-pay | Admitting: Ophthalmology

## 2024-03-09 DIAGNOSIS — I10 Essential (primary) hypertension: Secondary | ICD-10-CM | POA: Diagnosis not present

## 2024-03-09 DIAGNOSIS — H34831 Tributary (branch) retinal vein occlusion, right eye, with macular edema: Secondary | ICD-10-CM

## 2024-03-09 DIAGNOSIS — H25813 Combined forms of age-related cataract, bilateral: Secondary | ICD-10-CM

## 2024-03-09 DIAGNOSIS — H35033 Hypertensive retinopathy, bilateral: Secondary | ICD-10-CM | POA: Diagnosis not present

## 2024-03-09 MED ORDER — AFLIBERCEPT 2MG/0.05ML IZ SOLN FOR KALEIDOSCOPE
2.0000 mg | INTRAVITREAL | Status: AC | PRN
  Administered 2024-03-09: 2 mg via INTRAVITREAL

## 2024-05-08 NOTE — Progress Notes (Signed)
 [facility] - Clinic Note  05/21/2024    CHIEF COMPLAINT Patient presents for Retina Follow Up   HISTORY OF PRESENT ILLNESS: Emily Reid is a 78 y.o. female who presents to the clinic today for:  HPI     Retina Follow Up   Patient presents with  CRVO/BRVO.  In right eye.  This started 3.5 years ago.  Duration of 10.  Since onset it is stable.  I, the attending physician,  performed the HPI with the patient and updated documentation appropriately.        Comments   Pt denies any changes or concerns with vision. Pt has had dry eyes recently and is using Systane 3-4 times per day OU.      Last edited by Valdemar Rogue, MD on 05/21/2024  3:24 PM.     Pt states the vision is pretty good.   Referring physician: Craven Locus MD [address] [place], [place] [address]  HISTORICAL INFORMATION:   Selected notes from the MEDICAL RECORD NUMBER Referred by Dr. Craven for continuation of eye injections OD for BRVO with edema LEE: 08/05/2020 Ocular Hx- BRVO, CME, iritis OD PMH- HTN   CURRENT MEDICATIONS: No current outpatient medications on file. (Ophthalmic Drugs)   No current facility-administered medications for this visit. (Ophthalmic Drugs)   Current Outpatient Medications (Other)  Medication Sig   amLODipine  (NORVASC ) 10 MG tablet Take 1 tablet (10 mg total) by mouth daily.   aspirin  EC 325 MG tablet Take 1 tablet (325 mg total) by mouth daily. (Patient not taking: Reported on 11/13/2021)   buPROPion (WELLBUTRIN XL) 150 MG 24 hr tablet Take 150 mg by mouth daily.   Coenzyme Q10 (COQ10) 100 MG CAPS Take by mouth daily.   LOSARTAN POTASSIUM PO Take 1 tablet by mouth daily.   mirtazapine  (REMERON ) 15 MG tablet TAKE 1 TABLET AT BEDTIME   Multiple Vitamin (MULTIVITAMIN) tablet Take 1 tablet by mouth daily.   oxyCODONE  (OXY IR/ROXICODONE ) 5 MG immediate release tablet Take 1 tablet (5 mg total) by mouth every 4 (four) hours as needed (severe  pain). (Patient not taking: Reported on 11/13/2021)   Phosphatidylserine 100 MG CAPS Take by mouth.   rosuvastatin  (CRESTOR ) 20 MG tablet Take 20 mg by mouth daily.   sertraline  (ZOLOFT ) 50 MG tablet TAKE 1 TABLET EVERY DAY   Turmeric (QC TUMERIC COMPLEX PO) Take 553 mg by mouth daily.   vitamin C (ASCORBIC ACID) 500 MG tablet Take 500 mg by mouth daily.   No current facility-administered medications for this visit. (Other)   REVIEW OF SYSTEMS: ROS   Positive for: Genitourinary, Eyes Negative for: Constitutional, Gastrointestinal, Neurological, Skin, Musculoskeletal, HENT, Endocrine, Cardiovascular, Respiratory, Psychiatric, Allergic/Imm, Heme/Lymph Last edited by Elnor Avelina GORMAN, COT on 05/21/2024  1:53 PM.       ALLERGIES No Known Allergies  PAST MEDICAL HISTORY Past Medical History:  Diagnosis Date   Anxiety    Cataract    Mixed OU   Depression    Hyperlipidemia    Hypertension    Hypertensive retinopathy    OU   Left rotator cuff tear    Urine incontinence    Past Surgical History:  Procedure Laterality Date   FACIAL COSMETIC SURGERY     SHOULDER ARTHROSCOPY WITH SUBACROMIAL DECOMPRESSION, ROTATOR CUFF REPAIR AND BICEP TENDON REPAIR Left 11/17/2021   Procedure: LEFT SHOULDER ARTHROSCOPY WITH ROTATOR CUFF REPAIR AND BICEPS TENODESIS;  Surgeon: Genelle Standing, MD;  Location: [facility]  [place];  Service: Orthopedics;  Laterality: Left;   TONSILLECTOMY  1957   tummy tuck     FAMILY HISTORY Family History  Problem Relation Age of Onset   Heart disease Mother    Hyperlipidemia Mother    Hypertension Mother    Stroke Mother    SOCIAL HISTORY Social History   Tobacco Use   Smoking status: Never   Smokeless tobacco: Never  Vaping Use   Vaping status: Never Used  Substance Use Topics   Alcohol  use: Yes    Alcohol /week: 7.0 standard drinks of alcohol     Types: 7 Glasses of wine per week    Comment: social   Drug use: No       OPHTHALMIC  EXAM:  Base Eye Exam     Visual Acuity (Snellen - Linear)       Right Left   Dist cc 25-2 20/25 -1   Dist ph cc 20/25 NI    Correction: Glasses         Tonometry (Tonopen, 1:57 PM)       Right Left   Pressure 17 20         Pupils       Pupils Dark Light Shape React APD   Right PERRL 4 2 Round Brisk None   Left PERRL 4 2 Round Brisk None         Visual Fields       Left Right    Full Full         Extraocular Movement       Right Left    Full, Ortho Full, Ortho         Neuro/Psych     Oriented x3: Yes   Mood/Affect: Normal         Dilation     Both eyes: 1.0% Mydriacyl, 2.5% Phenylephrine  @ 1:58 PM           Slit Lamp and Fundus Exam     Slit Lamp Exam       Right Left   Lids/Lashes Normal Normal   Conjunctiva/Sclera White and quiet temporal pinguecula   Cornea 1+ inferior Punctate epithelial erosions 1+ inferior Punctate epithelial erosions   Anterior Chamber deep and clear deep and clear   Iris Round and dilated Round and dilated   Lens 2+ Nuclear sclerosis, 2+ Cortical cataract, focal Posterior subcapsular cataract at 0600 2+ Nuclear sclerosis, 2+ Cortical cataract, focal Posterior subcapsular cataract at 0700   Anterior Vitreous Vitreous syneresis, silicone oil micro bubbles Vitreous syneresis, silicone oil micro bubbles, Posterior vitreous detachment         Fundus Exam       Right Left   Disc Pink and Sharp, mild PPA Pink and Sharp   C/D Ratio 0.2 0.2   Macula Flat, Blunted foveal reflex, Drusen, RPE mottling, No heme, mild increase in central cystic changes / edema inferior macula and fovea Flat, good foveal reflex, Drusen, RPE mottling and clumping, No heme or edema   Vessels attenuated, mild tortuosity attenuated, mild tortuosity   Periphery Attached, no heme Attached, mild mid zonal drusen, no heme              Refraction     Wearing Rx       Sphere Cylinder Axis Add   Right +0.75 +1.25 179 +2.75   Left +1.75  +0.50 005 +2.75           IMAGING AND PROCEDURES  Imaging and Procedures  for 05/21/2024  OCT, Retina - OU - Both Eyes       Right Eye Quality was good. Central Foveal Thickness: 308. Progression has worsened. Findings include no SRF, abnormal foveal contour (Hx of BRVO, trace central cystic changes with inferior extension--slightly increased).   Left Eye Quality was good. Central Foveal Thickness: 299. Progression has been stable. Findings include normal foveal contour, no IRF, no SRF, retinal drusen .   Notes *Images captured and stored on drive  Diagnosis / Impression:  OD: Hx of BRVO, trace central cystic changes with inferior extension--slightly increased OS: NFP, no IRF/SRF, +drusen  Clinical management:  See below  Abbreviations: NFP - Normal foveal profile. CME - cystoid macular edema. PED - pigment epithelial detachment. IRF - intraretinal fluid. SRF - subretinal fluid. EZ - ellipsoid zone. ERM - epiretinal membrane. ORA - outer retinal atrophy. ORT - outer retinal tubulation. SRHM - subretinal hyper-reflective material. IRHM - intraretinal hyper-reflective material      Intravitreal Injection, Pharmacologic Agent - OD - Right Eye       Time Out 05/21/2024. 2:30 PM. Confirmed correct patient, procedure, site, and patient consented.   Anesthesia Topical anesthesia was used. Anesthetic medications included Lidocaine  2%, Proparacaine 0.5%.   Procedure Preparation included 5% betadine to ocular surface, eyelid speculum. A (32g) needle was used.   Injection: 2 mg aflibercept  2 MG/0.05ML   Route: Intravitreal, Site: Right Eye   NDC: [id number], Lot: [id number], Expiration date: 05/27/2025, Waste: 0 mL   Post-op Post injection exam found visual acuity of at least counting fingers. The patient tolerated the procedure well. There were no complications. The patient received written and verbal post procedure care education. Post injection medications were not given.             ASSESSMENT/PLAN:    ICD-10-CM   1. Branch retinal vein occlusion of right eye with macular edema (HCC)  H34.8310 OCT, Retina - OU - Both Eyes    Intravitreal Injection, Pharmacologic Agent - OD - Right Eye    aflibercept  (EYLEA ) SOLN 2 mg    2. Essential hypertension  I10     3. Hypertensive retinopathy of both eyes  H35.033     4. Combined forms of age-related cataract of both eyes  H25.813      1. BRVO w/ CME OD - delayed f/u -- 8 wks instead of 7 (07.25.24 to 09.20.24) due to [place] travel - delayed to follow up from 9 weeks to 13 weeks (11.01.23 - 01.31.24) -- worse edema - s/p IVA OD #1 (03.30.22), #2 (05.23.22), #3 (7.25.22), #4 (10.05.22), #5 (12.14.22), #6 (03.15.23), #7 (05.31.23), #8 (08.16.23), #9 (11.01.23), #10 (01.31.24), #11 (03.27.24), #12 (05.29.24), #13 (07.25.24)  -- IVA Resistance ================ - s/p IVE OD #1 (09.20.24), #2 (11.01.24), #3 (12.13.24), #4 (01.23.25), #5 (03.12.25), #6 (05.05.25) #7 (07.07.2025), #8 (09.12.25) - hx of injections with Dr. Craven in [place], [place] -- pt was receiving Lucentis q7-8 wks; pt reports history Avastin , but was switched to Lucentis at some point - last injection IVL OD was 02.04.22 (7.5 wks prior to initial visit here) **h/o worsening IRF at 13 weeks on 01.31.24 and 03.15.23; 11 wks on 11.01.23; 8 wks on 07.25.24 and 09.20.24 [IVA]** - BCVA OD 20/25 - stable - OCT today shows Hx of BRVO, trace central cystic changes with inferior extension--slightly increased at 10 weeks - recommend IVE OD #9 today (11.24.25) -- w/ f/u dec to 9 wks - RBA of procedure discussed, questions answered - IVE informed consent  obtained and signed, 11.24.25 (OD) - see procedure note  - approved for Eylea , but [employer] denied due to income restrictions -- pt covering 20% coinsurance - **discussed decreased efficacy / resistance to Eylea  and the benefit of switching medication to Vabysmo**   - will check insurance for  authorization - f/u 9 wks -- DFE, OCT, possible injection  2,3.  Hypertensive retinopathy OU - discussed importance of tight BP control - monitor  4. Mixed Cataract OU - The symptoms of cataract, surgical options, and treatments and risks were discussed with patient. - discussed diagnosis and progression - monitor   Ophthalmic Meds Ordered this visit:  Meds ordered this encounter  Medications   aflibercept  (EYLEA ) SOLN 2 mg     Return in about 9 weeks (around 07/23/2024) for f/u, BRVO, DFE, OCT, Possible, IVE, OD.  There are no Patient Instructions on file for this visit.  Explained the diagnoses, plan, and follow up with the patient and they expressed understanding.  Patient expressed understanding of the importance of proper follow up care.   This document serves as a record of services personally performed by Redell JUDITHANN Hans, MD, PhD. It was created on their behalf by Avelina Pereyra, COA an ophthalmic technician. The creation of this record is the provider's dictation and/or activities during the visit.   Electronically signed by: Avelina GORMAN Pereyra, COT  05/22/24  1:53 PM   This document serves as a record of services personally performed by Redell JUDITHANN Hans, MD, PhD. It was created on their behalf by Wanda GEANNIE Keens, COT an ophthalmic technician. The creation of this record is the provider's dictation and/or activities during the visit.    Electronically signed by:  Wanda GEANNIE Keens, COT  05/22/24 1:53 PM  Redell JUDITHANN Hans, M.D., Ph.D. Diseases & Surgery of the Retina and Vitreous [facility] 05/21/2024  I have reviewed the above documentation for accuracy and completeness, and I agree with the above. Redell JUDITHANN Hans, M.D., Ph.D. 05/22/24 1:53 PM   Abbreviations: M myopia (nearsighted); A astigmatism; H hyperopia (farsighted); P presbyopia; Mrx spectacle prescription;  CTL contact lenses; OD right eye; OS left eye; OU both eyes  XT exotropia; ET  esotropia; PEK punctate epithelial keratitis; PEE punctate epithelial erosions; DES dry eye syndrome; MGD meibomian gland dysfunction; ATs artificial tears; PFAT's preservative free artificial tears; NSC nuclear sclerotic cataract; PSC posterior subcapsular cataract; ERM epi-retinal membrane; PVD posterior vitreous detachment; RD retinal detachment; DM diabetes mellitus; DR diabetic retinopathy; NPDR non-proliferative diabetic retinopathy; PDR proliferative diabetic retinopathy; CSME clinically significant macular edema; DME diabetic macular edema; dbh dot blot hemorrhages; CWS cotton wool spot; POAG primary open angle glaucoma; C/D cup-to-disc ratio; HVF humphrey visual field; GVF goldmann visual field; OCT optical coherence tomography; IOP intraocular pressure; BRVO Branch retinal vein occlusion; CRVO central retinal vein occlusion; CRAO central retinal artery occlusion; BRAO branch retinal artery occlusion; RT retinal tear; SB scleral buckle; PPV pars plana vitrectomy; VH Vitreous hemorrhage; PRP panretinal laser photocoagulation; IVK intravitreal kenalog; VMT vitreomacular traction; MH Macular hole;  NVD neovascularization of the disc; NVE neovascularization elsewhere; AREDS age related eye disease study; ARMD age related macular degeneration; POAG primary open angle glaucoma; EBMD epithelial/anterior basement membrane dystrophy; ACIOL anterior chamber intraocular lens; IOL intraocular lens; PCIOL posterior chamber intraocular lens; Phaco/IOL phacoemulsification with intraocular lens placement; PRK photorefractive keratectomy; LASIK laser assisted in situ keratomileusis; HTN hypertension; DM diabetes mellitus; COPD chronic obstructive pulmonary disease

## 2024-05-18 ENCOUNTER — Encounter (INDEPENDENT_AMBULATORY_CARE_PROVIDER_SITE_OTHER): Admitting: Ophthalmology

## 2024-05-21 ENCOUNTER — Encounter (INDEPENDENT_AMBULATORY_CARE_PROVIDER_SITE_OTHER): Payer: Self-pay | Admitting: Ophthalmology

## 2024-05-21 ENCOUNTER — Ambulatory Visit (INDEPENDENT_AMBULATORY_CARE_PROVIDER_SITE_OTHER): Admitting: Ophthalmology

## 2024-05-21 DIAGNOSIS — I10 Essential (primary) hypertension: Secondary | ICD-10-CM

## 2024-05-21 DIAGNOSIS — H35033 Hypertensive retinopathy, bilateral: Secondary | ICD-10-CM | POA: Diagnosis not present

## 2024-05-21 DIAGNOSIS — H34831 Tributary (branch) retinal vein occlusion, right eye, with macular edema: Secondary | ICD-10-CM

## 2024-05-21 DIAGNOSIS — H25813 Combined forms of age-related cataract, bilateral: Secondary | ICD-10-CM

## 2024-05-21 MED ORDER — AFLIBERCEPT 2MG/0.05ML IZ SOLN FOR KALEIDOSCOPE
2.0000 mg | INTRAVITREAL | Status: AC | PRN
  Administered 2024-05-21: 2 mg via INTRAVITREAL

## 2024-07-12 NOTE — Progress Notes (Shared)
 " [facility] - Clinic Note  07/23/2024    CHIEF COMPLAINT Patient presents for No chief complaint on file.   HISTORY OF PRESENT ILLNESS: Emily Reid is a 79 y.o. female who presents to the clinic today for:    Pt states the vision is pretty good.   Referring physician: Craven Locus MD [address] [place], [place] [address]  HISTORICAL INFORMATION:   Selected notes from the MEDICAL RECORD NUMBER Referred by Dr. Craven for continuation of eye injections OD for BRVO with edema LEE: 08/05/2020 Ocular Hx- BRVO, CME, iritis OD PMH- HTN   CURRENT MEDICATIONS: No current outpatient medications on file. (Ophthalmic Drugs)   No current facility-administered medications for this visit. (Ophthalmic Drugs)   Current Outpatient Medications (Other)  Medication Sig   amLODipine  (NORVASC ) 10 MG tablet Take 1 tablet (10 mg total) by mouth daily.   aspirin  EC 325 MG tablet Take 1 tablet (325 mg total) by mouth daily. (Patient not taking: Reported on 11/13/2021)   buPROPion (WELLBUTRIN XL) 150 MG 24 hr tablet Take 150 mg by mouth daily.   Coenzyme Q10 (COQ10) 100 MG CAPS Take by mouth daily.   LOSARTAN POTASSIUM PO Take 1 tablet by mouth daily.   mirtazapine  (REMERON ) 15 MG tablet TAKE 1 TABLET AT BEDTIME   Multiple Vitamin (MULTIVITAMIN) tablet Take 1 tablet by mouth daily.   oxyCODONE  (OXY IR/ROXICODONE ) 5 MG immediate release tablet Take 1 tablet (5 mg total) by mouth every 4 (four) hours as needed (severe pain). (Patient not taking: Reported on 11/13/2021)   Phosphatidylserine 100 MG CAPS Take by mouth.   rosuvastatin  (CRESTOR ) 20 MG tablet Take 20 mg by mouth daily.   sertraline  (ZOLOFT ) 50 MG tablet TAKE 1 TABLET EVERY DAY   Turmeric (QC TUMERIC COMPLEX PO) Take 553 mg by mouth daily.   vitamin C (ASCORBIC ACID) 500 MG tablet Take 500 mg by mouth daily.   No current facility-administered medications for this visit. (Other)   REVIEW OF  SYSTEMS:     ALLERGIES No Known Allergies  PAST MEDICAL HISTORY Past Medical History:  Diagnosis Date   Anxiety    Cataract    Mixed OU   Depression    Hyperlipidemia    Hypertension    Hypertensive retinopathy    OU   Left rotator cuff tear    Urine incontinence    Past Surgical History:  Procedure Laterality Date   FACIAL COSMETIC SURGERY     SHOULDER ARTHROSCOPY WITH SUBACROMIAL DECOMPRESSION, ROTATOR CUFF REPAIR AND BICEP TENDON REPAIR Left 11/17/2021   Procedure: LEFT SHOULDER ARTHROSCOPY WITH ROTATOR CUFF REPAIR AND BICEPS TENODESIS;  Surgeon: Genelle Standing, MD;  Location: [facility];  Service: Orthopedics;  Laterality: Left;   TONSILLECTOMY  1957   tummy tuck     FAMILY HISTORY Family History  Problem Relation Age of Onset   Heart disease Mother    Hyperlipidemia Mother    Hypertension Mother    Stroke Mother    SOCIAL HISTORY Social History   Tobacco Use   Smoking status: Never   Smokeless tobacco: Never  Vaping Use   Vaping status: Never Used  Substance Use Topics   Alcohol  use: Yes    Alcohol /week: 7.0 standard drinks of alcohol     Types: 7 Glasses of wine per week    Comment: social   Drug use: No       OPHTHALMIC EXAM:  Not recorded  IMAGING AND PROCEDURES  Imaging and Procedures for 07/23/2024          ASSESSMENT/PLAN:    ICD-10-CM   1. Branch retinal vein occlusion of right eye with macular edema (HCC)  H34.8310     2. Essential hypertension  I10     3. Hypertensive retinopathy of both eyes  H35.033     4. Combined forms of age-related cataract of both eyes  H25.813       1. BRVO w/ CME OD - delayed f/u -- 8 wks instead of 7 (07.25.24 to 09.20.24) due to [place] travel - delayed to follow up from 9 weeks to 13 weeks (11.01.23 - 01.31.24) -- worse edema - s/p IVA OD #1 (03.30.22), #2 (05.23.22), #3 (7.25.22), #4 (10.05.22), #5 (12.14.22), #6 (03.15.23), #7 (05.31.23), #8 (08.16.23), #9 (11.01.23), #10  (01.31.24), #11 (03.27.24), #12 (05.29.24), #13 (07.25.24)  -- IVA Resistance ================ - s/p IVE OD #1 (09.20.24), #2 (11.01.24), #3 (12.13.24), #4 (01.23.25), #5 (03.12.25), #6 (05.05.25) #7 (07.07.2025), #8 (09.12.25), #9 (11.25.25) - hx of injections with Dr. Craven in [place], [place] -- pt was receiving Lucentis q7-8 wks; pt reports history Avastin , but was switched to Lucentis at some point - last injection IVL OD was 02.04.22 (7.5 wks prior to initial visit here) **h/o worsening IRF at 13 weeks on 01.31.24 and 03.15.23; 11 wks on 11.01.23; 8 wks on 07.25.24 and 09.20.24 [IVA]** - BCVA OD 20/25 - stable - OCT today shows Hx of BRVO, trace central cystic changes with inferior extension--slightly increased at 10 weeks - recommend IVE OD #10 today (01.26.26) -- w/ f/u dec to 9 wks - RBA of procedure discussed, questions answered - IVE informed consent obtained and signed, 11.24.25 (OD) - see procedure note  - approved for Eylea , but Good Days denied due to income restrictions -- pt covering 20% coinsurance - **discussed decreased efficacy / resistance to Eylea  and the benefit of switching medication to Vabysmo**   - will check insurance for authorization - f/u 9 wks -- DFE, OCT, possible injection   2,3.  Hypertensive retinopathy OU - discussed importance of tight BP control - monitor   4. Mixed Cataract OU - The symptoms of cataract, surgical options, and treatments and risks were discussed with patient. - discussed diagnosis and progression - monitor    Ophthalmic Meds Ordered this visit:  No orders of the defined types were placed in this encounter.    No follow-ups on file.  There are no Patient Instructions on file for this visit.  Explained the diagnoses, plan, and follow up with the patient and they expressed understanding.  Patient expressed understanding of the importance of proper follow up care.   This document serves as a record of services personally  performed by Redell JUDITHANN Hans, MD, PhD. It was created on their behalf by Paulina Jamse Gay an ophthalmic technician. The creation of this record is the provider's dictation and/or activities during the visit.   Electronically signed by: Paulina JONETTA Gay  07/12/24  2:12 PM   Redell JUDITHANN Hans, M.D., Ph.D. Diseases & Surgery of the Retina and Vitreous [facility] 07/23/2024   Abbreviations: M myopia (nearsighted); A astigmatism; H hyperopia (farsighted); P presbyopia; Mrx spectacle prescription;  CTL contact lenses; OD right eye; OS left eye; OU both eyes  XT exotropia; ET esotropia; PEK punctate epithelial keratitis; PEE punctate epithelial erosions; DES dry eye syndrome; MGD meibomian gland dysfunction; ATs artificial tears; PFAT's preservative free artificial tears; NSC nuclear  sclerotic cataract; PSC posterior subcapsular cataract; ERM epi-retinal membrane; PVD posterior vitreous detachment; RD retinal detachment; DM diabetes mellitus; DR diabetic retinopathy; NPDR non-proliferative diabetic retinopathy; PDR proliferative diabetic retinopathy; CSME clinically significant macular edema; DME diabetic macular edema; dbh dot blot hemorrhages; CWS cotton wool spot; POAG primary open angle glaucoma; C/D cup-to-disc ratio; HVF humphrey visual field; GVF goldmann visual field; OCT optical coherence tomography; IOP intraocular pressure; BRVO Branch retinal vein occlusion; CRVO central retinal vein occlusion; CRAO central retinal artery occlusion; BRAO branch retinal artery occlusion; RT retinal tear; SB scleral buckle; PPV pars plana vitrectomy; VH Vitreous hemorrhage; PRP panretinal laser photocoagulation; IVK intravitreal kenalog; VMT vitreomacular traction; MH Macular hole;  NVD neovascularization of the disc; NVE neovascularization elsewhere; AREDS age related eye disease study; ARMD age related macular degeneration; POAG primary open angle glaucoma; EBMD epithelial/anterior basement  membrane dystrophy; ACIOL anterior chamber intraocular lens; IOL intraocular lens; PCIOL posterior chamber intraocular lens; Phaco/IOL phacoemulsification with intraocular lens placement; PRK photorefractive keratectomy; LASIK laser assisted in situ keratomileusis; HTN hypertension; DM diabetes mellitus; COPD chronic obstructive pulmonary disease  "

## 2024-07-18 NOTE — Progress Notes (Incomplete)
 " [facility] - Clinic Note  07/19/2024    CHIEF COMPLAINT Patient presents for No chief complaint on file.   HISTORY OF PRESENT ILLNESS: Emily Reid is a 79 y.o. female who presents to the clinic today for:    Pt states the vision is pretty good.   Referring physician: Craven Locus MD [address] [place], [place] [address]  HISTORICAL INFORMATION:   Selected notes from the MEDICAL RECORD NUMBER Referred by Dr. Craven for continuation of eye injections OD for BRVO with edema LEE: 08/05/2020 Ocular Hx- BRVO, CME, iritis OD PMH- HTN   CURRENT MEDICATIONS: No current outpatient medications on file. (Ophthalmic Drugs)   No current facility-administered medications for this visit. (Ophthalmic Drugs)   Current Outpatient Medications (Other)  Medication Sig   amLODipine  (NORVASC ) 10 MG tablet Take 1 tablet (10 mg total) by mouth daily.   aspirin  EC 325 MG tablet Take 1 tablet (325 mg total) by mouth daily. (Patient not taking: Reported on 11/13/2021)   buPROPion (WELLBUTRIN XL) 150 MG 24 hr tablet Take 150 mg by mouth daily.   Coenzyme Q10 (COQ10) 100 MG CAPS Take by mouth daily.   LOSARTAN POTASSIUM PO Take 1 tablet by mouth daily.   mirtazapine  (REMERON ) 15 MG tablet TAKE 1 TABLET AT BEDTIME   Multiple Vitamin (MULTIVITAMIN) tablet Take 1 tablet by mouth daily.   oxyCODONE  (OXY IR/ROXICODONE ) 5 MG immediate release tablet Take 1 tablet (5 mg total) by mouth every 4 (four) hours as needed (severe pain). (Patient not taking: Reported on 11/13/2021)   Phosphatidylserine 100 MG CAPS Take by mouth.   rosuvastatin  (CRESTOR ) 20 MG tablet Take 20 mg by mouth daily.   sertraline  (ZOLOFT ) 50 MG tablet TAKE 1 TABLET EVERY DAY   Turmeric (QC TUMERIC COMPLEX PO) Take 553 mg by mouth daily.   vitamin C (ASCORBIC ACID) 500 MG tablet Take 500 mg by mouth daily.   No current facility-administered medications for this visit. (Other)   REVIEW OF  SYSTEMS:     ALLERGIES No Known Allergies  PAST MEDICAL HISTORY Past Medical History:  Diagnosis Date   Anxiety    Cataract    Mixed OU   Depression    Hyperlipidemia    Hypertension    Hypertensive retinopathy    OU   Left rotator cuff tear    Urine incontinence    Past Surgical History:  Procedure Laterality Date   FACIAL COSMETIC SURGERY     SHOULDER ARTHROSCOPY WITH SUBACROMIAL DECOMPRESSION, ROTATOR CUFF REPAIR AND BICEP TENDON REPAIR Left 11/17/2021   Procedure: LEFT SHOULDER ARTHROSCOPY WITH ROTATOR CUFF REPAIR AND BICEPS TENODESIS;  Surgeon: Genelle Standing, MD;  Location: [facility];  Service: Orthopedics;  Laterality: Left;   TONSILLECTOMY  1957   tummy tuck     FAMILY HISTORY Family History  Problem Relation Age of Onset   Heart disease Mother    Hyperlipidemia Mother    Hypertension Mother    Stroke Mother    SOCIAL HISTORY Social History   Tobacco Use   Smoking status: Never   Smokeless tobacco: Never  Vaping Use   Vaping status: Never Used  Substance Use Topics   Alcohol  use: Yes    Alcohol /week: 7.0 standard drinks of alcohol     Types: 7 Glasses of wine per week    Comment: social   Drug use: No       OPHTHALMIC EXAM:  Not recorded  IMAGING AND PROCEDURES  Imaging and Procedures for 07/19/2024          ASSESSMENT/PLAN:  No diagnosis found.   1. BRVO w/ CME OD - delayed f/u -- 8 wks instead of 7 (07.25.24 to 09.20.24) due to [place] travel - delayed to follow up from 9 weeks to 13 weeks (11.01.23 - 01.31.24) -- worse edema - s/p IVA OD #1 (03.30.22), #2 (05.23.22), #3 (7.25.22), #4 (10.05.22), #5 (12.14.22), #6 (03.15.23), #7 (05.31.23), #8 (08.16.23), #9 (11.01.23), #10 (01.31.24), #11 (03.27.24), #12 (05.29.24), #13 (07.25.24)  -- IVA Resistance ================ - s/p IVE OD #1 (09.20.24), #2 (11.01.24), #3 (12.13.24), #4 (01.23.25), #5 (03.12.25), #6 (05.05.25) #7 (07.07.2025), #8 (09.12.25), #9  (11.25.25) - hx of injections with Dr. Craven in [place], [place] -- pt was receiving Lucentis q7-8 wks; pt reports history Avastin , but was switched to Lucentis at some point - last injection IVL OD was 02.04.22 (7.5 wks prior to initial visit here) **h/o worsening IRF at 13 weeks on 01.31.24 and 03.15.23; 11 wks on 11.01.23; 8 wks on 07.25.24 and 09.20.24 [IVA]** - BCVA OD 20/25 - stable - OCT today shows Hx of BRVO, trace central cystic changes with inferior extension--slightly increased at 10 weeks - recommend IVE OD #10 today (01.22.26) -- w/ f/u dec to 9 wks - RBA of procedure discussed, questions answered - IVE informed consent obtained and signed, 11.24.25 (OD) - see procedure note  - approved for Eylea , but [employer] Days denied due to income restrictions -- pt covering 20% coinsurance - **discussed decreased efficacy / resistance to Eylea  and the benefit of switching medication to Vabysmo**   - will check insurance for authorization - f/u 9 wks -- DFE, OCT, possible injection   2,3.  Hypertensive retinopathy OU - discussed importance of tight BP control - monitor   4. Mixed Cataract OU - The symptoms of cataract, surgical options, and treatments and risks were discussed with patient. - discussed diagnosis and progression - monitor    Ophthalmic Meds Ordered this visit:  No orders of the defined types were placed in this encounter.    No follow-ups on file.  There are no Patient Instructions on file for this visit.  Explained the diagnoses, plan, and follow up with the patient and they expressed understanding.  Patient expressed understanding of the importance of proper follow up care.   This document serves as a record of services personally performed by Redell JUDITHANN Hans, MD, PhD. It was created on their behalf by Paulina Jamse Gay an ophthalmic technician. The creation of this record is the provider's dictation and/or activities during the visit.   Electronically signed by:  Paulina JONETTA Gay  07/18/24  7:31 AM   Redell JUDITHANN Hans, M.D., Ph.D. Diseases & Surgery of the Retina and Vitreous [facility] 07/19/2024   Abbreviations: M myopia (nearsighted); A astigmatism; H hyperopia (farsighted); P presbyopia; Mrx spectacle prescription;  CTL contact lenses; OD right eye; OS left eye; OU both eyes  XT exotropia; ET esotropia; PEK punctate epithelial keratitis; PEE punctate epithelial erosions; DES dry eye syndrome; MGD meibomian gland dysfunction; ATs artificial tears; PFAT's preservative free artificial tears; NSC nuclear sclerotic cataract; PSC posterior subcapsular cataract; ERM epi-retinal membrane; PVD posterior vitreous detachment; RD retinal detachment; DM diabetes mellitus; DR diabetic retinopathy; NPDR non-proliferative diabetic retinopathy; PDR proliferative diabetic retinopathy; CSME clinically significant macular edema; DME diabetic macular edema; dbh dot blot hemorrhages; CWS cotton wool spot; POAG primary open angle glaucoma; C/D cup-to-disc ratio; HVF  humphrey visual field; GVF goldmann visual field; OCT optical coherence tomography; IOP intraocular pressure; BRVO Branch retinal vein occlusion; CRVO central retinal vein occlusion; CRAO central retinal artery occlusion; BRAO branch retinal artery occlusion; RT retinal tear; SB scleral buckle; PPV pars plana vitrectomy; VH Vitreous hemorrhage; PRP panretinal laser photocoagulation; IVK intravitreal kenalog; VMT vitreomacular traction; MH Macular hole;  NVD neovascularization of the disc; NVE neovascularization elsewhere; AREDS age related eye disease study; ARMD age related macular degeneration; POAG primary open angle glaucoma; EBMD epithelial/anterior basement membrane dystrophy; ACIOL anterior chamber intraocular lens; IOL intraocular lens; PCIOL posterior chamber intraocular lens; Phaco/IOL phacoemulsification with intraocular lens placement; PRK photorefractive keratectomy; LASIK laser assisted in  situ keratomileusis; HTN hypertension; DM diabetes mellitus; COPD chronic obstructive pulmonary disease  "

## 2024-07-19 ENCOUNTER — Encounter (INDEPENDENT_AMBULATORY_CARE_PROVIDER_SITE_OTHER): Admitting: Ophthalmology

## 2024-07-19 NOTE — Progress Notes (Shared)
 " [facility] - Clinic Note  07/30/2024    CHIEF COMPLAINT Patient presents for No chief complaint on file.   HISTORY OF PRESENT ILLNESS: Emily Reid is a 79 y.o. female who presents to the clinic today for:    Pt states the vision is pretty good.   Referring physician: Craven Locus MD [address] [place], [place] [address]  HISTORICAL INFORMATION:   Selected notes from the MEDICAL RECORD NUMBER Referred by Dr. Craven for continuation of eye injections OD for BRVO with edema LEE: 08/05/2020 Ocular Hx- BRVO, CME, iritis OD PMH- HTN   CURRENT MEDICATIONS: No current outpatient medications on file. (Ophthalmic Drugs)   No current facility-administered medications for this visit. (Ophthalmic Drugs)   Current Outpatient Medications (Other)  Medication Sig   amLODipine  (NORVASC ) 10 MG tablet Take 1 tablet (10 mg total) by mouth daily.   aspirin  EC 325 MG tablet Take 1 tablet (325 mg total) by mouth daily. (Patient not taking: Reported on 11/13/2021)   buPROPion (WELLBUTRIN XL) 150 MG 24 hr tablet Take 150 mg by mouth daily.   Coenzyme Q10 (COQ10) 100 MG CAPS Take by mouth daily.   LOSARTAN POTASSIUM PO Take 1 tablet by mouth daily.   mirtazapine  (REMERON ) 15 MG tablet TAKE 1 TABLET AT BEDTIME   Multiple Vitamin (MULTIVITAMIN) tablet Take 1 tablet by mouth daily.   oxyCODONE  (OXY IR/ROXICODONE ) 5 MG immediate release tablet Take 1 tablet (5 mg total) by mouth every 4 (four) hours as needed (severe pain). (Patient not taking: Reported on 11/13/2021)   Phosphatidylserine 100 MG CAPS Take by mouth.   rosuvastatin  (CRESTOR ) 20 MG tablet Take 20 mg by mouth daily.   sertraline  (ZOLOFT ) 50 MG tablet TAKE 1 TABLET EVERY DAY   Turmeric (QC TUMERIC COMPLEX PO) Take 553 mg by mouth daily.   vitamin C (ASCORBIC ACID) 500 MG tablet Take 500 mg by mouth daily.   No current facility-administered medications for this visit. (Other)   REVIEW OF  SYSTEMS:     ALLERGIES No Known Allergies  PAST MEDICAL HISTORY Past Medical History:  Diagnosis Date   Anxiety    Cataract    Mixed OU   Depression    Hyperlipidemia    Hypertension    Hypertensive retinopathy    OU   Left rotator cuff tear    Urine incontinence    Past Surgical History:  Procedure Laterality Date   FACIAL COSMETIC SURGERY     SHOULDER ARTHROSCOPY WITH SUBACROMIAL DECOMPRESSION, ROTATOR CUFF REPAIR AND BICEP TENDON REPAIR Left 11/17/2021   Procedure: LEFT SHOULDER ARTHROSCOPY WITH ROTATOR CUFF REPAIR AND BICEPS TENODESIS;  Surgeon: Genelle Standing, MD;  Location: [facility];  Service: Orthopedics;  Laterality: Left;   TONSILLECTOMY  1957   tummy tuck     FAMILY HISTORY Family History  Problem Relation Age of Onset   Heart disease Mother    Hyperlipidemia Mother    Hypertension Mother    Stroke Mother    SOCIAL HISTORY Social History   Tobacco Use   Smoking status: Never   Smokeless tobacco: Never  Vaping Use   Vaping status: Never Used  Substance Use Topics   Alcohol  use: Yes    Alcohol /week: 7.0 standard drinks of alcohol     Types: 7 Glasses of wine per week    Comment: social   Drug use: No       OPHTHALMIC EXAM:  Not recorded  IMAGING AND PROCEDURES  Imaging and Procedures for 07/30/2024          ASSESSMENT/PLAN:    ICD-10-CM   1. Branch retinal vein occlusion of right eye with macular edema (HCC)  H34.8310     2. Essential hypertension  I10     3. Hypertensive retinopathy of both eyes  H35.033     4. Combined forms of age-related cataract of both eyes  H25.813       1. BRVO w/ CME OD - delayed f/u -- 8 wks instead of 7 (07.25.24 to 09.20.24) due to [place] travel - delayed to follow up from 9 weeks to 13 weeks (11.01.23 - 01.31.24) -- worse edema - s/p IVA OD #1 (03.30.22), #2 (05.23.22), #3 (7.25.22), #4 (10.05.22), #5 (12.14.22), #6 (03.15.23), #7 (05.31.23), #8 (08.16.23), #9 (11.01.23), #10  (01.31.24), #11 (03.27.24), #12 (05.29.24), #13 (07.25.24)  -- IVA Resistance ================ - s/p IVE OD #1 (09.20.24), #2 (11.01.24), #3 (12.13.24), #4 (01.23.25), #5 (03.12.25), #6 (05.05.25) #7 (07.07.2025), #8 (09.12.25), #9 (11.24.25) - hx of injections with Dr. Craven in [place], [place] -- pt was receiving Lucentis q7-8 wks; pt reports history Avastin , but was switched to Lucentis at some point - last injection IVL OD was 02.04.22 (7.5 wks prior to initial visit here) **h/o worsening IRF at 13 weeks on 01.31.24 and 03.15.23; 11 wks on 11.01.23; 8 wks on 07.25.24 and 09.20.24 [IVA]** - BCVA OD 20/25 - stable - OCT today shows Hx of BRVO, trace central cystic changes with inferior extension--slightly increased at 10 weeks - recommend IVE OD #10 today (02.02.26) -- w/ f/u dec to 9 wks - RBA of procedure discussed, questions answered - IVE informed consent obtained and signed, 11.24.25 (OD) - see procedure note  - approved for Eylea , but [employer] Days denied due to income restrictions -- pt covering 20% coinsurance - **discussed decreased efficacy / resistance to Eylea  and the benefit of switching medication to Vabysmo**   - will check insurance for authorization - f/u 9 wks -- DFE, OCT, possible injection  2,3. Hypertensive retinopathy OU - discussed importance of tight BP control - monitor   4. Mixed Cataract OU - The symptoms of cataract, surgical options, and treatments and risks were discussed with patient. - discussed diagnosis and progression - monitor    Ophthalmic Meds Ordered this visit:  No orders of the defined types were placed in this encounter.    No follow-ups on file.  There are no Patient Instructions on file for this visit.  Explained the diagnoses, plan, and follow up with the patient and they expressed understanding.  Patient expressed understanding of the importance of proper follow up care.   This document serves as a record of services personally  performed by Redell JUDITHANN Hans, MD, PhD. It was created on their behalf by Paulina Jamse Gay an ophthalmic technician. The creation of this record is the provider's dictation and/or activities during the visit.   Electronically signed by: Paulina JONETTA Gay  07/19/24  12:25 PM   Redell JUDITHANN Hans, M.D., Ph.D. Diseases & Surgery of the Retina and Vitreous [facility] 07/30/2024   Abbreviations: M myopia (nearsighted); A astigmatism; H hyperopia (farsighted); P presbyopia; Mrx spectacle prescription;  CTL contact lenses; OD right eye; OS left eye; OU both eyes  XT exotropia; ET esotropia; PEK punctate epithelial keratitis; PEE punctate epithelial erosions; DES dry eye syndrome; MGD meibomian gland dysfunction; ATs artificial tears; PFAT's preservative free artificial tears; NSC nuclear sclerotic cataract;  PSC posterior subcapsular cataract; ERM epi-retinal membrane; PVD posterior vitreous detachment; RD retinal detachment; DM diabetes mellitus; DR diabetic retinopathy; NPDR non-proliferative diabetic retinopathy; PDR proliferative diabetic retinopathy; CSME clinically significant macular edema; DME diabetic macular edema; dbh dot blot hemorrhages; CWS cotton wool spot; POAG primary open angle glaucoma; C/D cup-to-disc ratio; HVF humphrey visual field; GVF goldmann visual field; OCT optical coherence tomography; IOP intraocular pressure; BRVO Branch retinal vein occlusion; CRVO central retinal vein occlusion; CRAO central retinal artery occlusion; BRAO branch retinal artery occlusion; RT retinal tear; SB scleral buckle; PPV pars plana vitrectomy; VH Vitreous hemorrhage; PRP panretinal laser photocoagulation; IVK intravitreal kenalog; VMT vitreomacular traction; MH Macular hole;  NVD neovascularization of the disc; NVE neovascularization elsewhere; AREDS age related eye disease study; ARMD age related macular degeneration; POAG primary open angle glaucoma; EBMD epithelial/anterior basement  membrane dystrophy; ACIOL anterior chamber intraocular lens; IOL intraocular lens; PCIOL posterior chamber intraocular lens; Phaco/IOL phacoemulsification with intraocular lens placement; PRK photorefractive keratectomy; LASIK laser assisted in situ keratomileusis; HTN hypertension; DM diabetes mellitus; COPD chronic obstructive pulmonary disease  "

## 2024-07-20 ENCOUNTER — Encounter (INDEPENDENT_AMBULATORY_CARE_PROVIDER_SITE_OTHER): Admitting: Ophthalmology

## 2024-07-23 ENCOUNTER — Encounter (INDEPENDENT_AMBULATORY_CARE_PROVIDER_SITE_OTHER): Admitting: Ophthalmology

## 2024-07-23 DIAGNOSIS — H34831 Tributary (branch) retinal vein occlusion, right eye, with macular edema: Secondary | ICD-10-CM

## 2024-07-23 DIAGNOSIS — I10 Essential (primary) hypertension: Secondary | ICD-10-CM

## 2024-07-23 DIAGNOSIS — H25813 Combined forms of age-related cataract, bilateral: Secondary | ICD-10-CM

## 2024-07-23 DIAGNOSIS — H35033 Hypertensive retinopathy, bilateral: Secondary | ICD-10-CM

## 2024-07-30 ENCOUNTER — Encounter (INDEPENDENT_AMBULATORY_CARE_PROVIDER_SITE_OTHER): Admitting: Ophthalmology

## 2024-07-30 DIAGNOSIS — H25813 Combined forms of age-related cataract, bilateral: Secondary | ICD-10-CM

## 2024-07-30 DIAGNOSIS — H35033 Hypertensive retinopathy, bilateral: Secondary | ICD-10-CM

## 2024-07-30 DIAGNOSIS — H34831 Tributary (branch) retinal vein occlusion, right eye, with macular edema: Secondary | ICD-10-CM

## 2024-07-30 DIAGNOSIS — I10 Essential (primary) hypertension: Secondary | ICD-10-CM

## 2024-07-31 ENCOUNTER — Encounter (INDEPENDENT_AMBULATORY_CARE_PROVIDER_SITE_OTHER): Payer: Self-pay | Admitting: Ophthalmology

## 2024-07-31 ENCOUNTER — Ambulatory Visit (INDEPENDENT_AMBULATORY_CARE_PROVIDER_SITE_OTHER): Admitting: Ophthalmology

## 2024-07-31 DIAGNOSIS — H34831 Tributary (branch) retinal vein occlusion, right eye, with macular edema: Secondary | ICD-10-CM

## 2024-07-31 DIAGNOSIS — I10 Essential (primary) hypertension: Secondary | ICD-10-CM | POA: Diagnosis not present

## 2024-07-31 DIAGNOSIS — H35033 Hypertensive retinopathy, bilateral: Secondary | ICD-10-CM

## 2024-07-31 DIAGNOSIS — H25813 Combined forms of age-related cataract, bilateral: Secondary | ICD-10-CM | POA: Diagnosis not present

## 2024-07-31 MED ORDER — AFLIBERCEPT 2MG/0.05ML IZ SOLN FOR KALEIDOSCOPE
2.0000 mg | INTRAVITREAL | Status: AC | PRN
  Administered 2024-07-31: 2 mg via INTRAVITREAL

## 2024-10-15 ENCOUNTER — Encounter (INDEPENDENT_AMBULATORY_CARE_PROVIDER_SITE_OTHER): Admitting: Ophthalmology
